# Patient Record
Sex: Female | Born: 1975
Health system: Southern US, Community
[De-identification: ages and names within clinical notes are randomized; demographics above are authoritative.]

## PROBLEM LIST (undated history)

## (undated) ENCOUNTER — Emergency Department (HOSPITAL_BASED_OUTPATIENT_CLINIC_OR_DEPARTMENT_OTHER)

## (undated) DIAGNOSIS — F32A Depression, unspecified: Secondary | ICD-10-CM

## (undated) DIAGNOSIS — F329 Major depressive disorder, single episode, unspecified: Secondary | ICD-10-CM

## (undated) DIAGNOSIS — R55 Syncope and collapse: Secondary | ICD-10-CM

## (undated) DIAGNOSIS — E039 Hypothyroidism, unspecified: Secondary | ICD-10-CM

## (undated) DIAGNOSIS — G43909 Migraine, unspecified, not intractable, without status migrainosus: Secondary | ICD-10-CM

## (undated) DIAGNOSIS — D649 Anemia, unspecified: Secondary | ICD-10-CM

## (undated) DIAGNOSIS — K5900 Constipation, unspecified: Secondary | ICD-10-CM

## (undated) DIAGNOSIS — R002 Palpitations: Secondary | ICD-10-CM

## (undated) DIAGNOSIS — R87629 Unspecified abnormal cytological findings in specimens from vagina: Secondary | ICD-10-CM

## (undated) DIAGNOSIS — F419 Anxiety disorder, unspecified: Secondary | ICD-10-CM

## (undated) DIAGNOSIS — E079 Disorder of thyroid, unspecified: Secondary | ICD-10-CM

## (undated) DIAGNOSIS — M7989 Other specified soft tissue disorders: Secondary | ICD-10-CM

## (undated) DIAGNOSIS — G4730 Sleep apnea, unspecified: Secondary | ICD-10-CM

## (undated) HISTORY — DX: Anxiety disorder, unspecified: F41.9

## (undated) HISTORY — DX: Unspecified abnormal cytological findings in specimens from vagina: R87.629

## (undated) HISTORY — PX: CERVICAL CONE BIOPSY: SUR198

## (undated) HISTORY — DX: Other specified soft tissue disorders: M79.89

## (undated) HISTORY — DX: Disorder of thyroid, unspecified: E07.9

## (undated) HISTORY — DX: Migraine, unspecified, not intractable, without status migrainosus: G43.909

## (undated) HISTORY — DX: Sleep apnea, unspecified: G47.30

## (undated) HISTORY — DX: Hypothyroidism, unspecified: E03.9

## (undated) HISTORY — DX: Constipation, unspecified: K59.00

## (undated) HISTORY — PX: KNEE SURGERY: SHX244

## (undated) HISTORY — DX: Anemia, unspecified: D64.9

## (undated) HISTORY — PX: MENISCUS REPAIR: SHX5179

## (undated) HISTORY — PX: ENDOMETRIAL ABLATION: SHX621

## (undated) HISTORY — DX: Palpitations: R00.2

---

## 2014-04-01 DIAGNOSIS — F332 Major depressive disorder, recurrent severe without psychotic features: Secondary | ICD-10-CM | POA: Insufficient documentation

## 2014-04-01 DIAGNOSIS — N390 Urinary tract infection, site not specified: Secondary | ICD-10-CM | POA: Insufficient documentation

## 2014-04-01 DIAGNOSIS — Q279 Congenital malformation of peripheral vascular system, unspecified: Secondary | ICD-10-CM | POA: Insufficient documentation

## 2014-04-01 DIAGNOSIS — G90A Postural orthostatic tachycardia syndrome (POTS): Secondary | ICD-10-CM | POA: Insufficient documentation

## 2014-04-01 DIAGNOSIS — I498 Other specified cardiac arrhythmias: Secondary | ICD-10-CM | POA: Insufficient documentation

## 2014-04-01 HISTORY — DX: Congenital malformation of peripheral vascular system, unspecified: Q27.9

## 2014-04-01 HISTORY — DX: Postural orthostatic tachycardia syndrome (POTS): G90.A

## 2014-04-01 HISTORY — DX: Urinary tract infection, site not specified: N39.0

## 2014-04-14 DIAGNOSIS — Z9889 Other specified postprocedural states: Secondary | ICD-10-CM | POA: Insufficient documentation

## 2014-04-14 HISTORY — DX: Other specified postprocedural states: Z98.890

## 2014-05-15 DIAGNOSIS — M6289 Other specified disorders of muscle: Secondary | ICD-10-CM | POA: Insufficient documentation

## 2014-05-15 DIAGNOSIS — E278 Other specified disorders of adrenal gland: Secondary | ICD-10-CM | POA: Insufficient documentation

## 2014-05-15 DIAGNOSIS — Z62810 Personal history of physical and sexual abuse in childhood: Secondary | ICD-10-CM | POA: Insufficient documentation

## 2014-05-15 HISTORY — DX: Other specified disorders of muscle: M62.89

## 2014-05-15 HISTORY — DX: Other specified disorders of adrenal gland: E27.8

## 2014-08-24 ENCOUNTER — Encounter (HOSPITAL_BASED_OUTPATIENT_CLINIC_OR_DEPARTMENT_OTHER): Payer: Self-pay | Admitting: Emergency Medicine

## 2014-08-24 ENCOUNTER — Emergency Department (HOSPITAL_BASED_OUTPATIENT_CLINIC_OR_DEPARTMENT_OTHER)
Admission: EM | Admit: 2014-08-24 | Discharge: 2014-08-24 | Disposition: A | Discharge status: Home / Self Care | Payer: BLUE CROSS/BLUE SHIELD | Attending: Emergency Medicine | Admitting: Emergency Medicine

## 2014-08-24 DIAGNOSIS — R110 Nausea: Secondary | ICD-10-CM | POA: Insufficient documentation

## 2014-08-24 DIAGNOSIS — R55 Syncope and collapse: Secondary | ICD-10-CM

## 2014-08-24 DIAGNOSIS — R61 Generalized hyperhidrosis: Secondary | ICD-10-CM | POA: Diagnosis not present

## 2014-08-24 DIAGNOSIS — R079 Chest pain, unspecified: Secondary | ICD-10-CM | POA: Diagnosis not present

## 2014-08-24 DIAGNOSIS — F329 Major depressive disorder, single episode, unspecified: Secondary | ICD-10-CM | POA: Insufficient documentation

## 2014-08-24 DIAGNOSIS — Z79899 Other long term (current) drug therapy: Secondary | ICD-10-CM | POA: Insufficient documentation

## 2014-08-24 DIAGNOSIS — R200 Anesthesia of skin: Secondary | ICD-10-CM | POA: Insufficient documentation

## 2014-08-24 DIAGNOSIS — D696 Thrombocytopenia, unspecified: Secondary | ICD-10-CM | POA: Insufficient documentation

## 2014-08-24 DIAGNOSIS — R42 Dizziness and giddiness: Secondary | ICD-10-CM | POA: Diagnosis present

## 2014-08-24 HISTORY — DX: Syncope and collapse: R55

## 2014-08-24 HISTORY — DX: Depression, unspecified: F32.A

## 2014-08-24 HISTORY — DX: Major depressive disorder, single episode, unspecified: F32.9

## 2014-08-24 LAB — BASIC METABOLIC PANEL
ANION GAP: 8 (ref 5–15)
BUN: 9 mg/dL (ref 6–20)
CALCIUM: 9 mg/dL (ref 8.9–10.3)
CHLORIDE: 104 mmol/L (ref 101–111)
CO2: 27 mmol/L (ref 22–32)
CREATININE: 0.83 mg/dL (ref 0.44–1.00)
GFR calc Af Amer: >60 mL/min (ref >60)
GFR calc non Af Amer: >60 mL/min (ref >60)
GLUCOSE: 102 mg/dL — AB (ref 65–99)
POTASSIUM: 3.9 mmol/L (ref 3.5–5.1)
Sodium: 139 mmol/L (ref 135–145)

## 2014-08-24 MED ORDER — SODIUM CHLORIDE 0.9 % IV BOLUS (SEPSIS)
1000.0000 mL | Freq: Once | INTRAVENOUS | Status: AC
  Administered 2014-08-24: 1000 mL via INTRAVENOUS

## 2014-08-24 NOTE — ED Provider Notes (Signed)
CSN: [id number]     Arrival date & time 08/24/14  1518 History   This chart was scribed for Tilden Fossa, MD by Abel Presto, ED Scribe. This patient was seen in room MH09/MH09 and the patient's care was started at 3:55 PM.    Chief Complaint  Patient presents with  . Dizziness  . Chest Pain  . Numbness     The history is provided by the patient. No language interpreter was used.   HPI Comments: Kathleen Phillips is a 39 y.o. female with who presents to the Emergency Department complaining of constant tingling and numbness in fingers feet, dizziness, and chest pain with onset around 10:30 AM. Pt with h/o neurocardiogenic syncope. Pt states this is typical of an episode but reports the episode is lasting longer than usual. She reports she typically has methods to relieve symptoms but states they have given her no relief. She notes associated nausea and diaphoresis at first onset which have resolved. Pt notes with standing up causes visual disturbances described as "black spots." She states she typically gets tunnel vision. Pt is not a smoker. Pt denies h/o DVT/PE. Pt denies dysuria, SOB, abdominal pain, fever, vomiting, diarrhea, and leg swelling different than baseline.   Past Medical History  Diagnosis Date  . Neurocardiogenic syncope   . Depression    Past Surgical History  Procedure Laterality Date  . Knee surgery    . Meniscus repair    . Endometrial ablation     No family history on file. History  Substance Use Topics  . Smoking status: Never Smoker   . Smokeless tobacco: Not on file  . Alcohol Use: Not on file   OB History    No data available     Review of Systems  Constitutional: Positive for diaphoresis. Negative for fever and chills.  Respiratory: Negative for shortness of breath.   Cardiovascular: Positive for chest pain.  Gastrointestinal: Positive for nausea. Negative for vomiting, abdominal pain and diarrhea.  Genitourinary: Negative for dysuria.   Neurological: Positive for dizziness and numbness. Negative for syncope.  All other systems reviewed and are negative.     Allergies  Ibuprofen  Home Medications   Prior to Admission medications   Medication Sig Start Date End Date Taking? Authorizing Provider  Vilazodone HCl (VIIBRYD) 40 MG TABS Take 40 mg by mouth daily.   Yes Historical Provider, MD   There were no vitals taken for this visit. Physical Exam  Constitutional: She is oriented to person, place, and time. She appears well-developed and well-nourished.  HENT:  Head: Normocephalic and atraumatic.  Cardiovascular: Normal rate and regular rhythm.   No murmur heard. Pulmonary/Chest: Effort normal and breath sounds normal. No respiratory distress.  Abdominal: Soft. There is no tenderness. There is no rebound and no guarding.  Musculoskeletal: She exhibits no edema or tenderness.  Neurological: She is alert and oriented to person, place, and time.  Skin: Skin is warm and dry.  Psychiatric: She has a normal mood and affect. Her behavior is normal.  Nursing note and vitals reviewed.   ED Course  Procedures (including critical care time) DIAGNOSTIC STUDIES: Oxygen Saturation is 100% on room air, normal by my interpretation.    COORDINATION OF CARE: 4:00 PM Discussed treatment plan with patient at beside, the patient agrees with the plan and has no further questions at this time.   Labs Review Labs Reviewed  BASIC METABOLIC PANEL - Abnormal; Notable for the following:    Glucose, Bld 102 (*)  All other components within normal limits    Imaging Review No results found.   EKG Interpretation   Date/Time:  Monday Aug 24 2014 15:32:51 EDT Ventricular Rate:  79 PR Interval:  154 QRS Duration: 86 QT Interval:  364 QTC Calculation: 417 R Axis:   85 Text Interpretation:  Normal sinus rhythm Normal ECG Confirmed by Lincoln Brighamees,  Liz [phone]([id number]) on 08/24/2014 3:48:19 PM      MDM   Final diagnoses:  Near syncope    Patient with history of neurocardiogenic syncope here for evaluation of dizziness, lightheadedness, chest tightness earlier. Patient presents with CBC and CBG from office prior to arrival. Hemoglobin within normal limits. Mild thrombocytopenia. His reports there is no chance of pregnancy, she is status post endometrial ablation and her husband has had a vasectomy. Clinical picture is not consistent with PE, perc negative. Clinical picture is not consistent with ACS, acute arrhythmia. Patient is partially improved on recheck after IV fluid bolus. Discussed cardiology follow-up.  I personally performed the services described in this documentation, which was scribed in my presence. The recorded information has been reviewed and is accurate.    Tilden FossaElizabeth Uchechukwu Dhawan, MD 08/24/14 [phone]

## 2014-08-24 NOTE — ED Notes (Signed)
Pt sitting at work when sudden onset dizziness with chest squeezing.  Some hands and feet numbness.  No sob.  Some nausea.  Some diaphoresis.  Now pt states chest just feels sore, some tingling in feet and calves.  Pt states she sometimes feels this way before her syncopal events.

## 2014-08-24 NOTE — ED Notes (Signed)
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  She reports in January  her Na was high, and her K+ was low. She forgot to tell the provider today.   She tolerated the iv start and NS 1000 started at 1630

## 2014-08-24 NOTE — Discharge Instructions (Signed)
Near-Syncope Near-syncope (commonly known as near fainting) is sudden weakness, dizziness, or feeling like you might pass out. During an episode of near-syncope, you may also develop pale skin, have tunnel vision, or feel sick to your stomach (nauseous). Near-syncope may occur when getting up after sitting or while standing for a long time. It is caused by a sudden decrease in blood flow to the brain. This decrease can result from various causes or triggers, most of which are not serious. However, because near-syncope can sometimes be a sign of something serious, a medical evaluation is required. The specific cause is often not determined. HOME CARE INSTRUCTIONS  Monitor your condition for any changes. The following actions may help to alleviate any discomfort you are experiencing:  Have someone stay with you until you feel stable.  Lie down right away and prop your feet up if you start feeling like you might faint. Breathe deeply and steadily. Wait until all the symptoms have passed. Most of these episodes last only a few minutes. You may feel tired for several hours.   Drink enough fluids to keep your urine clear or pale yellow.   If you are taking blood pressure or heart medicine, get up slowly when seated or lying down. Take several minutes to sit and then stand. This can reduce dizziness.  Follow up with your health care provider as directed. SEEK IMMEDIATE MEDICAL CARE IF:   You have a severe headache.   You have unusual pain in the chest, abdomen, or back.   You are bleeding from the mouth or rectum, or you have black or tarry stool.   You have an irregular or very fast heartbeat.   You have repeated fainting or have seizure-like jerking during an episode.   You faint when sitting or lying down.   You have confusion.   You have difficulty walking.   You have severe weakness.   You have vision problems.  MAKE SURE YOU:   Understand these instructions.  Will  watch your condition.  Will get help right away if you are not doing well or get worse. Document Released: 03/20/2005 Document Revised: 03/25/2013 Document Reviewed: 08/23/2012 ExitCare Patient Information 2015 [employer]. This information is not intended to replace advice given to you by your health care provider. Make sure you discuss any questions you have with your health care provider.  

## 2017-05-23 LAB — HM COLONOSCOPY

## 2017-11-22 LAB — HM PAP SMEAR

## 2018-02-26 DIAGNOSIS — F3181 Bipolar II disorder: Secondary | ICD-10-CM

## 2018-02-26 HISTORY — DX: Bipolar II disorder: F31.81

## 2020-01-02 ENCOUNTER — Other Ambulatory Visit: Payer: Self-pay

## 2020-01-02 ENCOUNTER — Ambulatory Visit (INDEPENDENT_AMBULATORY_CARE_PROVIDER_SITE_OTHER): Payer: PRIVATE HEALTH INSURANCE | Admitting: Physician Assistant

## 2020-01-02 ENCOUNTER — Encounter: Payer: Self-pay | Admitting: Physician Assistant

## 2020-01-02 VITALS — BP 111/64 | HR 77 | Ht 69.0 in | Wt 208.0 lb

## 2020-01-02 DIAGNOSIS — Z1231 Encounter for screening mammogram for malignant neoplasm of breast: Secondary | ICD-10-CM

## 2020-01-02 DIAGNOSIS — I498 Other specified cardiac arrhythmias: Secondary | ICD-10-CM

## 2020-01-02 DIAGNOSIS — F332 Major depressive disorder, recurrent severe without psychotic features: Secondary | ICD-10-CM

## 2020-01-02 DIAGNOSIS — E6609 Other obesity due to excess calories: Secondary | ICD-10-CM

## 2020-01-02 DIAGNOSIS — F3181 Bipolar II disorder: Secondary | ICD-10-CM

## 2020-01-02 DIAGNOSIS — G90A Postural orthostatic tachycardia syndrome (POTS): Secondary | ICD-10-CM

## 2020-01-02 DIAGNOSIS — E66812 Obesity, class 2: Secondary | ICD-10-CM | POA: Insufficient documentation

## 2020-01-02 DIAGNOSIS — R41840 Attention and concentration deficit: Secondary | ICD-10-CM | POA: Diagnosis not present

## 2020-01-02 DIAGNOSIS — Z6830 Body mass index (BMI) 30.0-30.9, adult: Secondary | ICD-10-CM

## 2020-01-02 DIAGNOSIS — G43009 Migraine without aura, not intractable, without status migrainosus: Secondary | ICD-10-CM

## 2020-01-02 HISTORY — DX: Other obesity due to excess calories: E66.09

## 2020-01-02 MED ORDER — PHENTERMINE HCL 37.5 MG PO TABS: ORAL_TABLET | ORAL | 0 refills | Status: DC

## 2020-01-02 MED FILL — PHENTERMINE 37.5 MG TABLET: 37.5 | 90 days supply | Qty: 45 | Fill #0

## 2020-01-02 NOTE — Progress Notes (Signed)
New Patient Office Visit  Subjective:  Patient ID: Kathleen Phillips, female    DOB: 04/08/1975  Age: 44 y.o. MRN: [id number]  CC:  Chief Complaint  Patient presents with  . Establish Care    HPI Kathleen Phillips presents to establish care.   Patient has new insurance and needed to have a new PCP.  She needs referral to psychiatry to manage her behavioral health medications.  She would also like to be considered testing for ADHD.  She feels like her psych meds are controlled but she is still having increasingly problems with focus and staying on task.  She also has schoolwork at night that she has to continue to stay focused for prolonged periods of time.  She is also interested in weight loss.  She is part of a weight loss group where she was on Qsymia.  She did really well on Qsymia.  She was then put on Topamax separately after stopping the weight loss program for migraines.  She wonders if she can go on the phentermine portion.   Past Medical History:  Diagnosis Date  . Anxiety   . Depression   . Neurocardiogenic syncope     Past Surgical History:  Procedure Laterality Date  . ENDOMETRIAL ABLATION    . KNEE SURGERY    . MENISCUS REPAIR      Family History  Problem Relation Age of Onset  . Hypertension Mother   . Heart attack Mother   . Diabetes Mother   . Skin cancer Mother   . Lung cancer Father   . Heart attack Maternal Grandmother     Social History   Socioeconomic History  . Marital status: Married    Spouse name: Not on file  . Number of children: Not on file  . Years of education: Not on file  . Highest education level: Not on file  Occupational History  . Not on file  Tobacco Use  . Smoking status: Never Smoker  . Smokeless tobacco: Never Used  Substance and Sexual Activity  . Alcohol use: Never  . Drug use: Never  . Sexual activity: Yes    Birth control/protection: Surgical    Comment: vasectomy  Other Topics Concern  . Not on file  Social  History Narrative  . Not on file   Social Determinants of Health   Financial Resource Strain:   . Difficulty of Paying Living Expenses: Not on file  Food Insecurity:   . Worried About Programme researcher, broadcasting/film/video in the Last Year: Not on file  . Ran Out of Food in the Last Year: Not on file  Transportation Needs:   . Lack of Transportation (Medical): Not on file  . Lack of Transportation (Non-Medical): Not on file  Physical Activity:   . Days of Exercise per Week: Not on file  . Minutes of Exercise per Session: Not on file  Stress:   . Feeling of Stress : Not on file  Social Connections:   . Frequency of Communication with Friends and Family: Not on file  . Frequency of Social Gatherings with Friends and Family: Not on file  . Attends Religious Services: Not on file  . Active Member of Clubs or Organizations: Not on file  . Attends Banker Meetings: Not on file  . Marital Status: Not on file  Intimate Partner Violence:   . Fear of Current or Ex-Partner: Not on file  . Emotionally Abused: Not on file  . Physically Abused: Not on  file  . Sexually Abused: Not on file    ROS Review of Systems  All other systems reviewed and are negative.   Objective:   Today's Vitals: BP 111/64   Pulse 77   Ht 5\' 9"  (1.753 m)   Wt 208 lb (94.3 kg)   SpO2 100%   BMI 30.72 kg/m   Physical Exam Vitals reviewed.  Constitutional:      Appearance: Normal appearance.  HENT:     Head: Normocephalic.  Cardiovascular:     Rate and Rhythm: Normal rate and regular rhythm.     Heart sounds: No murmur heard.   Pulmonary:     Effort: Pulmonary effort is normal.     Breath sounds: Normal breath sounds.  Neurological:     General: No focal deficit present.     Mental Status: She is alert and oriented to person, place, and time.  Psychiatric:        Mood and Affect: Mood normal.     Assessment & Plan:  Marland KitchenChristal was seen today for establish care.  Diagnoses and all orders for  this visit:  Bipolar 2 disorder, major depressive episode (HCC) -     Ambulatory referral to Psychiatry  Class 1 obesity due to excess calories without serious comorbidity with body mass index (BMI) of 30.0 to 30.9 in adult -     phentermine (ADIPEX-P) 37.5 MG tablet; Take 1/2 tablet daily.  Major depressive disorder, recurrent, severe without psychotic features (HCC) -     Ambulatory referral to Psychiatry  Inattention -     Ambulatory referral to Psychology  Visit for screening mammogram -     MM 3D SCREEN BREAST BILATERAL  Migraine without aura and without status migrainosus, not intractable  POTS (postural orthostatic tachycardia syndrome)   ON had made referrals for behavioral health to manage her bipolar.  I did go ahead and order ADHD testing for patient.  I do want to have her help to manage any amphetamine stimulant use.  Patient does need a mammogram.  That was ordered today.  [name].Discussed low carb diet with 1500 calories and 80g of protein.  Exercising at least 150 minutes a week.  My Fitness Pal could be a [name].  Discussed intermittent fasting and other options for weight control.  She was on Qsymia and did well.  Agreed to take 37.5 phentermine dose and told her to cut in half.  This is approximately with the highest dose of Qsymia would be with the Topamax she is already on.   Follow-up: Return in about 6 months (around 07/02/2020).   09/01/2020, PA-C

## 2020-01-05 DIAGNOSIS — G43009 Migraine without aura, not intractable, without status migrainosus: Secondary | ICD-10-CM | POA: Insufficient documentation

## 2020-01-05 HISTORY — DX: Migraine without aura, not intractable, without status migrainosus: G43.009

## 2020-01-08 ENCOUNTER — Encounter: Payer: Self-pay | Admitting: Physician Assistant

## 2020-01-12 ENCOUNTER — Encounter: Payer: Self-pay | Admitting: Physician Assistant

## 2020-01-21 ENCOUNTER — Ambulatory Visit: Payer: BLUE CROSS/BLUE SHIELD | Admitting: Nurse Practitioner

## 2020-02-04 ENCOUNTER — Encounter: Payer: Self-pay | Admitting: Physician Assistant

## 2020-02-06 ENCOUNTER — Other Ambulatory Visit: Payer: Self-pay | Admitting: Physician Assistant

## 2020-02-06 MED ORDER — AMPHETAMINE-DEXTROAMPHET ER 10 MG PO CP24: 10.0000 mg | ORAL_CAPSULE | Freq: Every day | ORAL | 0 refills | Status: DC

## 2020-02-06 MED ORDER — RIZATRIPTAN BENZOATE 10 MG PO TABS: 10.0000 mg | ORAL_TABLET | ORAL | 1 refills | Status: DC | PRN

## 2020-02-06 MED FILL — RIZATRIPTAN 10 MG TABLET: 10 | 30 days supply | Qty: 12 | Fill #0

## 2020-02-06 MED FILL — ADDERALL XR 10 MG CAP SA: 10 | 30 days supply | Qty: 30 | Fill #0

## 2020-03-03 ENCOUNTER — Other Ambulatory Visit: Payer: Self-pay

## 2020-03-03 ENCOUNTER — Other Ambulatory Visit: Payer: Self-pay | Admitting: Physician Assistant

## 2020-03-03 ENCOUNTER — Ambulatory Visit (INDEPENDENT_AMBULATORY_CARE_PROVIDER_SITE_OTHER): Payer: 59 | Admitting: Physician Assistant

## 2020-03-03 VITALS — BP 93/65 | HR 80 | Ht 69.0 in | Wt 197.5 lb

## 2020-03-03 DIAGNOSIS — F332 Major depressive disorder, recurrent severe without psychotic features: Secondary | ICD-10-CM | POA: Diagnosis not present

## 2020-03-03 DIAGNOSIS — R41840 Attention and concentration deficit: Secondary | ICD-10-CM | POA: Diagnosis not present

## 2020-03-03 DIAGNOSIS — F3181 Bipolar II disorder: Secondary | ICD-10-CM | POA: Diagnosis not present

## 2020-03-03 DIAGNOSIS — I498 Other specified cardiac arrhythmias: Secondary | ICD-10-CM

## 2020-03-03 DIAGNOSIS — G43009 Migraine without aura, not intractable, without status migrainosus: Secondary | ICD-10-CM

## 2020-03-03 DIAGNOSIS — G90A Postural orthostatic tachycardia syndrome (POTS): Secondary | ICD-10-CM

## 2020-03-03 MED ORDER — TOPIRAMATE 100 MG PO TABS: 100.0000 mg | ORAL_TABLET | Freq: Every day | ORAL | 3 refills | Status: DC

## 2020-03-03 MED ORDER — AMPHETAMINE-DEXTROAMPHET ER 10 MG PO CP24: 10.0000 mg | ORAL_CAPSULE | Freq: Every day | ORAL | 0 refills | Status: DC

## 2020-03-03 MED ORDER — RISPERIDONE 1 MG PO TABS: ORAL_TABLET | ORAL | 3 refills | Status: DC

## 2020-03-03 MED ORDER — CITALOPRAM HYDROBROMIDE 10 MG PO TABS: 10.0000 mg | ORAL_TABLET | Freq: Every day | ORAL | 3 refills | Status: DC

## 2020-03-03 MED ORDER — VILAZODONE HCL 40 MG PO TABS: 40.0000 mg | ORAL_TABLET | Freq: Every day | ORAL | 3 refills | Status: DC

## 2020-03-03 MED FILL — ADDERALL XR 10 MG CAP SA: 10 | 30 days supply | Qty: 30 | Fill #0

## 2020-03-03 MED FILL — CITALOPRAM HBR 10 MG TABLET: 10 | 90 days supply | Qty: 90 | Fill #0

## 2020-03-03 MED FILL — risperiDONE 1 MG TABS: 1 | 90 days supply | Qty: 135 | Fill #0

## 2020-03-03 MED FILL — TOPIRAMATE 100 MG TABLET: 100 | 90 days supply | Qty: 90 | Fill #0

## 2020-03-03 MED FILL — VIIBRYD 40 MG TABLET: 40 | 90 days supply | Qty: 90 | Fill #0
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
## 2020-03-05 ENCOUNTER — Encounter: Payer: Self-pay | Admitting: Physician Assistant

## 2020-03-11 ENCOUNTER — Encounter: Payer: Self-pay | Admitting: Physician Assistant

## 2020-03-15 MED ORDER — WEGOVY 0.25 MG/0.5ML ~~LOC~~ SOAJ: 0.2500 mg | SUBCUTANEOUS | 0 refills | Status: DC

## 2020-03-19 ENCOUNTER — Other Ambulatory Visit: Payer: Self-pay | Admitting: Physician Assistant

## 2020-03-19 ENCOUNTER — Encounter: Payer: Self-pay | Admitting: Physician Assistant

## 2020-03-19 MED ORDER — ONDANSETRON 8 MG PO TBDP: 8.0000 mg | ORAL_TABLET | Freq: Three times a day (TID) | ORAL | 1 refills | Status: DC | PRN

## 2020-03-19 MED FILL — ONDANSETRON ODT 8 MG TABLET: 8 | 7 days supply | Qty: 20 | Fill #0

## 2020-03-23 ENCOUNTER — Encounter: Payer: Self-pay | Admitting: Physician Assistant

## 2020-04-11 ENCOUNTER — Encounter: Payer: Self-pay | Admitting: Physician Assistant

## 2020-04-11 MED ORDER — WEGOVY 0.5 MG/0.5ML ~~LOC~~ SOAJ: 0.5000 mg | SUBCUTANEOUS | 0 refills | Status: DC

## 2020-04-11 NOTE — Telephone Encounter (Signed)
RX sent for next dose, FYI note.

## 2020-04-16 MED FILL — ADDERALL XR 10 MG CAP SA: 10 | 30 days supply | Qty: 30 | Fill #0

## 2020-04-22 ENCOUNTER — Telehealth: Payer: Self-pay

## 2020-04-22 NOTE — Telephone Encounter (Signed)
Prior authorization for Wegovy 0.5 mg/ml submitted to patient's insurance. Pending determination.

## 2020-04-23 NOTE — Telephone Encounter (Signed)
Prior authorization for Wegovy approved by the insurance from 04/04/20 through 10/19/20. The authorization is effective for a maximum of 6 fill(s), as long as the patient is enrolled as a member of their current health plan.   The request has been approved for 2 ml per 28 days. Additional prior authorizations:  Wegovy 0.25 mg (#41753) Wegovy 1 mg (11295) Wegovy 1.7 mg (11296) Wegovy 2.4 mg (01040)  Pt has been updated that rx has been approved.

## 2020-05-11 ENCOUNTER — Encounter: Payer: Self-pay | Admitting: Physician Assistant

## 2020-05-12 MED ORDER — WEGOVY 1 MG/0.5ML ~~LOC~~ SOAJ: 1.0000 mg | SUBCUTANEOUS | 0 refills | Status: DC

## 2020-05-26 ENCOUNTER — Ambulatory Visit (INDEPENDENT_AMBULATORY_CARE_PROVIDER_SITE_OTHER): Payer: 59 | Admitting: Physician Assistant

## 2020-05-26 ENCOUNTER — Encounter: Payer: Self-pay | Admitting: Physician Assistant

## 2020-05-26 ENCOUNTER — Other Ambulatory Visit: Payer: Self-pay | Admitting: Physician Assistant

## 2020-05-26 VITALS — BP 103/66 | HR 84 | Ht 69.0 in | Wt 184.0 lb

## 2020-05-26 DIAGNOSIS — R41840 Attention and concentration deficit: Secondary | ICD-10-CM

## 2020-05-26 DIAGNOSIS — E663 Overweight: Secondary | ICD-10-CM

## 2020-05-26 DIAGNOSIS — K5901 Slow transit constipation: Secondary | ICD-10-CM | POA: Diagnosis not present

## 2020-05-26 MED ORDER — AMPHETAMINE-DEXTROAMPHET ER 10 MG PO CP24: 10.0000 mg | ORAL_CAPSULE | Freq: Every day | ORAL | 0 refills | Status: DC

## 2020-05-26 MED ORDER — WEGOVY 1.7 MG/0.75ML ~~LOC~~ SOAJ: 1.7000 mg | SUBCUTANEOUS | 0 refills | Status: DC

## 2020-05-26 MED ORDER — LINACLOTIDE 145 MCG PO CAPS: 145.0000 ug | ORAL_CAPSULE | Freq: Every day | ORAL | 1 refills | Status: DC

## 2020-05-26 MED FILL — LINZESS 145 MCG CAPSULE: 145 | 90 days supply | Qty: 90 | Fill #0

## 2020-05-26 MED FILL — ADDERALL XR 10 MG CAP SA: 10 | 30 days supply | Qty: 30 | Fill #0

## 2020-05-26 NOTE — Patient Instructions (Signed)
Constipation, Adult Constipation is when a person has trouble pooping (having a bowel movement). When you have this condition, you may poop fewer than 3 times a week. Your poop (stool) may also be dry, hard, or bigger than normal. Follow these instructions at home: Eating and drinking  Eat foods that have a lot of fiber, such as: ? Fresh fruits and vegetables. ? Whole grains. ? Beans.  Eat less of foods that are low in fiber and high in fat and sugar, such as: ? French fries. ? Hamburgers. ? Cookies. ? Candy. ? Soda.  Drink enough fluid to keep your pee (urine) pale yellow.   General instructions  Exercise regularly or as told by your doctor. Try to do 150 minutes of exercise each week.  Go to the restroom when you feel like you need to poop. Do not hold it in.  Take over-the-counter and prescription medicines only as told by your doctor. These include any fiber supplements.  When you poop: ? Do deep breathing while relaxing your lower belly (abdomen). ? Relax your pelvic floor. The pelvic floor is a group of muscles that support the rectum, bladder, and intestines (as well as the uterus in women).  Watch your condition for any changes. Tell your doctor if you notice any.  Keep all follow-up visits as told by your doctor. This is important. Contact a doctor if:  You have pain that gets worse.  You have a fever.  You have not pooped for 4 days.  You vomit.  You are not hungry.  You lose weight.  You are bleeding from the opening of the butt (anus).  You have thin, pencil-like poop. Get help right away if:  You have a fever, and your symptoms suddenly get worse.  You leak poop or have blood in your poop.  Your belly feels hard or bigger than normal (bloated).  You have very bad belly pain.  You feel dizzy or you faint. Summary  Constipation is when a person poops fewer than 3 times a week, has trouble pooping, or has poop that is dry, hard, or bigger than  normal.  Eat foods that have a lot of fiber.  Drink enough fluid to keep your pee (urine) pale yellow.  Take over-the-counter and prescription medicines only as told by your doctor. These include any fiber supplements. This information is not intended to replace advice given to you by your health care provider. Make sure you discuss any questions you have with your health care provider. Document Revised: 02/05/2019 Document Reviewed: 02/05/2019 Elsevier Patient Education  2021 Else[employer] Inc.  
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
## 2020-05-28 DIAGNOSIS — K5901 Slow transit constipation: Secondary | ICD-10-CM

## 2020-05-28 DIAGNOSIS — E663 Overweight: Secondary | ICD-10-CM | POA: Insufficient documentation

## 2020-05-28 DIAGNOSIS — R41840 Attention and concentration deficit: Secondary | ICD-10-CM | POA: Insufficient documentation

## 2020-05-28 HISTORY — DX: Slow transit constipation: K59.01

## 2020-05-31 ENCOUNTER — Encounter: Payer: Self-pay | Admitting: Physician Assistant

## 2020-06-10 MED FILL — TOPIRAMATE 100 MG TABLET: 100 | 90 days supply | Qty: 90 | Fill #1

## 2020-06-10 MED FILL — risperiDONE 1 MG TABS: 1 | 90 days supply | Qty: 135 | Fill #1

## 2020-06-28 ENCOUNTER — Encounter: Payer: Self-pay | Admitting: Medical-Surgical

## 2020-06-28 ENCOUNTER — Ambulatory Visit (INDEPENDENT_AMBULATORY_CARE_PROVIDER_SITE_OTHER): Payer: 59 | Admitting: Medical-Surgical

## 2020-06-28 ENCOUNTER — Other Ambulatory Visit: Payer: Self-pay | Admitting: Medical-Surgical

## 2020-06-28 ENCOUNTER — Other Ambulatory Visit: Payer: Self-pay

## 2020-06-28 VITALS — BP 95/63 | HR 73 | Ht 69.0 in | Wt 181.0 lb

## 2020-06-28 DIAGNOSIS — J0100 Acute maxillary sinusitis, unspecified: Secondary | ICD-10-CM | POA: Diagnosis not present

## 2020-06-28 DIAGNOSIS — J302 Other seasonal allergic rhinitis: Secondary | ICD-10-CM

## 2020-06-28 MED ORDER — METHYLPREDNISOLONE ACETATE 80 MG/ML IJ SUSP
80.0000 mg | Freq: Once | INTRAMUSCULAR | Status: AC
  Administered 2020-06-28: 80 mg via INTRAMUSCULAR

## 2020-06-28 MED ORDER — LEVOCETIRIZINE DIHYDROCHLORIDE 5 MG PO TABS: 5.0000 mg | ORAL_TABLET | Freq: Every evening | ORAL | 1 refills | Status: DC

## 2020-06-28 MED ORDER — CEFDINIR 300 MG PO CAPS: 300.0000 mg | ORAL_CAPSULE | Freq: Two times a day (BID) | ORAL | 0 refills | Status: DC

## 2020-06-28 MED FILL — LEVOCETIRIZINE 5 MG TABLET: 5 | 90 days supply | Qty: 90 | Fill #0

## 2020-06-28 MED FILL — CEFDINIR 300 MG CAPSULE: 300 | 7 days supply | Qty: 14 | Fill #0

## 2020-06-28 NOTE — Progress Notes (Signed)
Subjective:    CC: Sinus symptoms  HPI: Pleasant 45 year old female presenting today for evaluation of 3 weeks of sinus symptoms.  She does have extensive environmental allergies and has been taking Allegra and Flonase daily.  Unfortunately these do not seem to be working for her and she has had worsening sinus congestion with left frontal maxillary sinus pain.  She is having daily headaches and has noted nasal discharge that started as yellow but has progressed to green.  Has fluid buildup in her left ear causing several distortion but no pain or drainage.  Denies fever, chills, cough, shortness of breath, and chest pain.  I reviewed the past medical history, family history, social history, surgical history, and allergies today and no changes were needed.  Please see the problem list section below in epic for further details.  Past Medical History: Past Medical History:  Diagnosis Date  . Anxiety   . Depression   . Neurocardiogenic syncope    Past Surgical History: Past Surgical History:  Procedure Laterality Date  . ENDOMETRIAL ABLATION    . KNEE SURGERY    . MENISCUS REPAIR     Social History: Social History   Socioeconomic History  . Marital status: Married    Spouse name: Not on file  . Number of children: Not on file  . Years of education: Not on file  . Highest education level: Not on file  Occupational History  . Not on file  Tobacco Use  . Smoking status: Never Smoker  . Smokeless tobacco: Never Used  Substance and Sexual Activity  . Alcohol use: Never  . Drug use: Never  . Sexual activity: Yes    Birth control/protection: Surgical    Comment: vasectomy  Other Topics Concern  . Not on file  Social History Narrative  . Not on file   Social Determinants of Health   Financial Resource Strain: Not on file  Food Insecurity: Not on file  Transportation Needs: Not on file  Physical Activity: Not on file  Stress: Not on file  Social Connections: Not on file    Family History: Family History  Problem Relation Age of Onset  . Hypertension Mother   . Heart attack Mother   . Diabetes Mother   . Skin cancer Mother   . Lung cancer Father   . Heart attack Maternal Grandmother    Allergies: Allergies  Allergen Reactions  . Bupropion Itching and Other (See Comments)    Other reaction(s): Other Hands itch Hands itch   . Avocado Nausea And Vomiting  . Ibuprofen Other (See Comments)    Palpation   . Other Other (See Comments)    Berries  . Tape     Red/sore skin   Medications: See med rec.  Review of Systems: See HPI for pertinent positives and negatives.   Objective:    General: Well Developed, well nourished, and in no acute distress.  Neuro: Alert and oriented x3.  HEENT: Normocephalic, atraumatic, pupils equal round reactive to light, neck supple, no masses, no lymphadenopathy, thyroid nonpalpable.  No sinus tenderness nose evaluation.  Right TM normal, left TM with serous effusion, no redness/bulging/drainage. Skin: Warm and dry. Cardiac: Regular rate and rhythm, no murmurs rubs or gallops, no lower extremity edema.  Respiratory: Clear to auscultation bilaterally. Not using accessory muscles, speaking in full sentences.   Impression and Recommendations:    1. Acute non-recurrent maxillary sinusitis 2. Seasonal allergies Severe seasonal allergies with 3 weeks of sinus congestion and facial  pain.  Treating with cefdinir milligrams twice daily x7 days and Depo-Medrol 80 mg IM x1 in office. - methylPREDNISolone acetate (DEPO-MEDROL) injection 80 mg  Return if symptoms worsen or fail to improve. ___________________________________________ Thayer Ohm, DNP, APRN, FNP-BC Primary Care and Sports Medicine [facility]

## 2020-06-28 NOTE — Patient Instructions (Signed)
Switch to Xyzal (stop Allegra)  Start saline nasal spray prn  Continue Flonase   Try a cool mist humidifier especially at night  Take Cefdinir 300mg  BID for 7 days

## 2020-07-02 ENCOUNTER — Encounter: Payer: Self-pay | Admitting: Physician Assistant

## 2020-07-02 DIAGNOSIS — B001 Herpesviral vesicular dermatitis: Secondary | ICD-10-CM

## 2020-07-02 HISTORY — DX: Herpesviral vesicular dermatitis: B00.1

## 2020-07-02 MED ORDER — VALACYCLOVIR HCL 500 MG PO TABS: ORAL_TABLET | ORAL | 1 refills | Status: AC

## 2020-07-08 DIAGNOSIS — J343 Hypertrophy of nasal turbinates: Secondary | ICD-10-CM | POA: Diagnosis not present

## 2020-07-08 DIAGNOSIS — G4733 Obstructive sleep apnea (adult) (pediatric): Secondary | ICD-10-CM | POA: Diagnosis not present

## 2020-07-12 ENCOUNTER — Other Ambulatory Visit (HOSPITAL_BASED_OUTPATIENT_CLINIC_OR_DEPARTMENT_OTHER): Payer: Self-pay

## 2020-07-12 MED FILL — Amphetamine-Dextroamphetamine Cap ER 24HR 10 MG: ORAL | 30 days supply | Qty: 30 | Fill #0 | Status: AC

## 2020-07-12 MED FILL — Citalopram Hydrobromide Tab 10 MG (Base Equiv): ORAL | 90 days supply | Qty: 90 | Fill #0 | Status: AC

## 2020-07-12 MED FILL — Vilazodone HCl Tab 40 MG: ORAL | 90 days supply | Qty: 90 | Fill #0 | Status: AC

## 2020-07-15 ENCOUNTER — Ambulatory Visit: Payer: 59

## 2020-07-22 ENCOUNTER — Ambulatory Visit (INDEPENDENT_AMBULATORY_CARE_PROVIDER_SITE_OTHER): Payer: 59

## 2020-07-22 ENCOUNTER — Other Ambulatory Visit: Payer: Self-pay

## 2020-07-22 DIAGNOSIS — Z1231 Encounter for screening mammogram for malignant neoplasm of breast: Secondary | ICD-10-CM | POA: Diagnosis not present

## 2020-07-28 ENCOUNTER — Encounter: Payer: 59 | Admitting: Physician Assistant

## 2020-07-29 ENCOUNTER — Ambulatory Visit: Payer: 59 | Admitting: Psychology

## 2020-08-04 ENCOUNTER — Other Ambulatory Visit (HOSPITAL_BASED_OUTPATIENT_CLINIC_OR_DEPARTMENT_OTHER): Payer: Self-pay

## 2020-08-04 ENCOUNTER — Other Ambulatory Visit: Payer: Self-pay

## 2020-08-04 ENCOUNTER — Ambulatory Visit (INDEPENDENT_AMBULATORY_CARE_PROVIDER_SITE_OTHER): Payer: 59 | Admitting: Physician Assistant

## 2020-08-04 VITALS — BP 103/51 | HR 84 | Ht 69.0 in | Wt 186.0 lb

## 2020-08-04 DIAGNOSIS — R635 Abnormal weight gain: Secondary | ICD-10-CM | POA: Diagnosis not present

## 2020-08-04 DIAGNOSIS — G43009 Migraine without aura, not intractable, without status migrainosus: Secondary | ICD-10-CM | POA: Diagnosis not present

## 2020-08-04 DIAGNOSIS — Z1329 Encounter for screening for other suspected endocrine disorder: Secondary | ICD-10-CM

## 2020-08-04 DIAGNOSIS — Z131 Encounter for screening for diabetes mellitus: Secondary | ICD-10-CM | POA: Diagnosis not present

## 2020-08-04 DIAGNOSIS — E663 Overweight: Secondary | ICD-10-CM

## 2020-08-04 DIAGNOSIS — Z0000 Encounter for general adult medical examination without abnormal findings: Secondary | ICD-10-CM

## 2020-08-04 DIAGNOSIS — Z79899 Other long term (current) drug therapy: Secondary | ICD-10-CM

## 2020-08-04 DIAGNOSIS — Z13220 Encounter for screening for lipoid disorders: Secondary | ICD-10-CM | POA: Diagnosis not present

## 2020-08-04 MED ORDER — WEGOVY 1 MG/0.5ML ~~LOC~~ SOAJ
1.0000 mg | SUBCUTANEOUS | 2 refills | Status: DC
  Filled 2020-08-04: qty 2, 28d supply, fill #0

## 2020-08-04 MED ORDER — TOPIRAMATE 100 MG PO TABS
ORAL_TABLET | ORAL | 1 refills | Status: DC
  Filled 2020-08-04 – 2020-08-25 (×3): qty 135, 90d supply, fill #0
  Filled 2020-12-09: qty 135, 90d supply, fill #1

## 2020-08-04 NOTE — Progress Notes (Signed)
Subjective:     Kathleen Phillips is a 45 y.o. female and is here for a comprehensive physical exam. The patient reports problems - headaches are worsening. almost every day now. been on topamax for years 100mg .  no known change.   Social History   Socioeconomic History  . Marital status: Married    Spouse name: Not on file  . Number of children: Not on file  . Years of education: Not on file  . Highest education level: Not on file  Occupational History  . Not on file  Tobacco Use  . Smoking status: Never Smoker  . Smokeless tobacco: Never Used  Substance and Sexual Activity  . Alcohol use: Never  . Drug use: Never  . Sexual activity: Yes    Birth control/protection: Surgical    Comment: vasectomy  Other Topics Concern  . Not on file  Social History Narrative  . Not on file   Social Determinants of Health   Financial Resource Strain: Not on file  Food Insecurity: Not on file  Transportation Needs: Not on file  Physical Activity: Not on file  Stress: Not on file  Social Connections: Not on file  Intimate Partner Violence: Not on file   Health Maintenance  Topic Date Due  . HIV Screening  Never done  . Hepatitis C Screening  Never done  . COVID-19 Vaccine (3 - Booster for Pfizer series) 08/20/2020 (Originally 05/24/2020)  . INFLUENZA VACCINE  11/01/2020  . PAP SMEAR-Modifier  11/22/2020  . MAMMOGRAM  07/22/2021  . TETANUS/TDAP  06/28/2024  . HPV VACCINES  Aged Out    The following portions of the patient's history were reviewed and updated as appropriate: allergies, current medications, past family history, past medical history, past social history, past surgical history and problem list.  Review of Systems A comprehensive review of systems was negative.   Objective:    BP (!) 103/51   Pulse 84   Ht 5\' 9"  (1.753 m)   Wt 186 lb (84.4 kg)   SpO2 98%   BMI 27.47 kg/m  General appearance: alert, cooperative and appears stated age Head: Normocephalic, without  obvious abnormality, atraumatic Eyes: conjunctivae/corneas clear. PERRL, EOM's intact. Fundi benign. Ears: normal TM's and external ear canals both ears Nose: Nares normal. Septum midline. Mucosa normal. No drainage or sinus tenderness. Throat: lips, mucosa, and tongue normal; teeth and gums normal Neck: no adenopathy, no carotid bruit, no JVD, supple, symmetrical, trachea midline and thyroid not enlarged, symmetric, no tenderness/mass/nodules Back: symmetric, no curvature. ROM normal. No CVA tenderness. Lungs: clear to auscultation bilaterally Heart: regular rate and rhythm, S1, S2 normal, no murmur, click, rub or gallop Abdomen: soft, non-tender; bowel sounds normal; no masses,  no organomegaly Extremities: extremities normal, atraumatic, no cyanosis or edema Pulses: 2+ and symmetric Skin: Skin color, texture, turgor normal. No rashes or lesions Lymph nodes: Cervical, supraclavicular, and axillary nodes normal. Neurologic: Alert and oriented X 3, normal strength and tone. Normal symmetric reflexes. Normal coordination and gait   .06/30/2024 Depression screen [facility] 2/9 05/26/2020 01/02/2020  Decreased Interest 1 1  Down, Depressed, Hopeless 1 2  PHQ - 2 Score 2 3  Altered sleeping 1 2  Tired, decreased energy 1 2  Change in appetite 1 1  Feeling bad or failure about yourself  1 3  Trouble concentrating 0 0  Moving slowly or fidgety/restless 0 0  Suicidal thoughts 0 0  PHQ-9 Score 6 11  Difficult doing work/chores Somewhat difficult Somewhat difficult   .05/28/2020  GAD 7 : Generalized Anxiety Score 05/26/2020 01/02/2020  Nervous, Anxious, on Edge 0 1  Control/stop worrying 0 1  Worry too much - different things 0 1  Trouble relaxing 0 2  Restless 0 1  Easily annoyed or irritable 0 1  Afraid - awful might happen 0 2  Total GAD 7 Score 0 9  Anxiety Difficulty Not difficult at all Somewhat difficult     Assessment:    Healthy female exam.      Plan:    Marland KitchenMarland KitchenChristal was seen today for annual  exam.  Diagnoses and all orders for this visit:  Physical exam -     Cancel: CBC with Differential/Platelet -     Cancel: COMPLETE METABOLIC PANEL WITH GFR -     Cancel: Lipid Panel w/reflex Direct LDL -     Cancel: TSH -     CBC with Differential/Platelet -     COMPLETE METABOLIC PANEL WITH GFR -     Lipid Panel w/reflex Direct LDL -     TSH  Lipid screening -     Cancel: Lipid Panel w/reflex Direct LDL -     Lipid Panel w/reflex Direct LDL  Diabetes mellitus screening -     Cancel: COMPLETE METABOLIC PANEL WITH GFR -     COMPLETE METABOLIC PANEL WITH GFR  Thyroid disorder screen -     Cancel: TSH -     TSH  Medication management -     Cancel: CBC with Differential/Platelet -     Cancel: COMPLETE METABOLIC PANEL WITH GFR -     Cancel: Lipid Panel w/reflex Direct LDL -     Cancel: TSH -     CBC with Differential/Platelet -     COMPLETE METABOLIC PANEL WITH GFR -     Lipid Panel w/reflex Direct LDL -     TSH  Migraine without aura and without status migrainosus, not intractable -     topiramate (TOPAMAX) 100 MG tablet; Take one tablet at night and 1/2 tablet in the morning.  Overweight (BMI 25.0-29.9) -     Semaglutide-Weight Management (WEGOVY) 1 MG/0.5ML SOAJ; Inject 1 mg into the skin once a week.  Abnormal weight gain -     Semaglutide-Weight Management (WEGOVY) 1 MG/0.5ML SOAJ; Inject 1 mg into the skin once a week.   .. Discussed 150 minutes of exercise a week.  Encouraged vitamin D 1000 units and Calcium 1300mg  or 4 servings of dairy a day.  PHQ/GAD stable. Managed by [facility].  Mammogram UTD. Pap due this year.  Fasting labs ordered.  Covid/flu UTD.   [facility].Discussed low carb diet with 1500 calories and 80g of protein.  Exercising at least 150 minutes a week.  My Fitness Pal could be a [name].  Restart wegovy and stay at 1mg  where she tolerated.  She gained 8lbs from stopping 2 weeks ago.    Increased topamax to 200mg  daily. Follow up in 3 months or  sooner if headaches do not improve.     See After Visit Summary for Counseling Recommendations

## 2020-08-04 NOTE — Patient Instructions (Signed)
Health Maintenance, Female Adopting a healthy lifestyle and getting preventive care are important in promoting health and wellness. Ask your health care provider about:  The right schedule for you to have regular tests and exams.  Things you can do on your own to prevent diseases and keep yourself healthy. What should I know about diet, weight, and exercise? Eat a healthy diet  Eat a diet that includes plenty of vegetables, fruits, low-fat dairy products, and lean protein.  Do not eat a lot of foods that are high in solid fats, added sugars, or sodium.   Maintain a healthy weight Body mass index (BMI) is used to identify weight problems. It estimates body fat based on height and weight. Your health care provider can help determine your BMI and help you achieve or maintain a healthy weight. Get regular exercise Get regular exercise. This is one of the most important things you can do for your health. Most adults should:  Exercise for at least 150 minutes each week. The exercise should increase your heart rate and make you sweat (moderate-intensity exercise).  Do strengthening exercises at least twice a week. This is in addition to the moderate-intensity exercise.  Spend less time sitting. Even light physical activity can be beneficial. Watch cholesterol and blood lipids Have your blood tested for lipids and cholesterol at 43 years of age, then have this test every 5 years. Have your cholesterol levels checked more often if:  Your lipid or cholesterol levels are high.  You are older than 43 years of age.  You are at high risk for heart disease. What should I know about cancer screening? Depending on your health history and family history, you may need to have cancer screening at various ages. This may include screening for:  Breast cancer.  Cervical cancer.  Colorectal cancer.  Skin cancer.  Lung cancer. What should I know about heart disease, diabetes, and high blood  pressure? Blood pressure and heart disease  High blood pressure causes heart disease and increases the risk of stroke. This is more likely to develop in people who have high blood pressure readings, are of African descent, or are overweight.  Have your blood pressure checked: ? Every 3-5 years if you are 18-39 years of age. ? Every year if you are 40 years old or older. Diabetes Have regular diabetes screenings. This checks your fasting blood sugar level. Have the screening done:  Once every three years after age 40 if you are at a normal weight and have a low risk for diabetes.  More often and at a younger age if you are overweight or have a high risk for diabetes. What should I know about preventing infection? Hepatitis B If you have a higher risk for hepatitis B, you should be screened for this virus. Talk with your health care provider to find out if you are at risk for hepatitis B infection. Hepatitis C Testing is recommended for:  Everyone born from 1945 through 1965.  Anyone with known risk factors for hepatitis C. Sexually transmitted infections (STIs)  Get screened for STIs, including gonorrhea and chlamydia, if: ? You are sexually active and are younger than 43 years of age. ? You are older than 43 years of age and your health care provider tells you that you are at risk for this type of infection. ? Your sexual activity has changed since you were last screened, and you are at increased risk for chlamydia or gonorrhea. Ask your health care provider   if you are at risk.  Ask your health care provider about whether you are at high risk for HIV. Your health care provider may recommend a prescription medicine to help prevent HIV infection. If you choose to take medicine to prevent HIV, you should first get tested for HIV. You should then be tested every 3 months for as long as you are taking the medicine. Pregnancy  If you are about to stop having your period (premenopausal) and  you may become pregnant, seek counseling before you get pregnant.  Take 400 to 800 micrograms (mcg) of folic acid every day if you become pregnant.  Ask for birth control (contraception) if you want to prevent pregnancy. Osteoporosis and menopause Osteoporosis is a disease in which the bones lose minerals and strength with aging. This can result in bone fractures. If you are 65 years old or older, or if you are at risk for osteoporosis and fractures, ask your health care provider if you should:  Be screened for bone loss.  Take a calcium or vitamin D supplement to lower your risk of fractures.  Be given hormone replacement therapy (HRT) to treat symptoms of menopause. Follow these instructions at home: Lifestyle  Do not use any products that contain nicotine or tobacco, such as cigarettes, e-cigarettes, and chewing tobacco. If you need help quitting, ask your health care provider.  Do not use street drugs.  Do not share needles.  Ask your health care provider for help if you need support or information about quitting drugs. Alcohol use  Do not drink alcohol if: ? Your health care provider tells you not to drink. ? You are pregnant, may be pregnant, or are planning to become pregnant.  If you drink alcohol: ? Limit how much you use to 0-1 drink a day. ? Limit intake if you are breastfeeding.  Be aware of how much alcohol is in your drink. In the [place] one drink equals one 12 oz bottle of beer (355 mL), one 5 oz glass of wine (148 mL), or one 1 oz glass of hard liquor (44 mL). General instructions  Schedule regular health, dental, and eye exams.  Stay current with your vaccines.  Tell your health care provider if: ? You often feel depressed. ? You have ever been abused or do not feel safe at home. Summary  Adopting a healthy lifestyle and getting preventive care are important in promoting health and wellness.  Follow your health care provider's instructions about healthy  diet, exercising, and getting tested or screened for diseases.  Follow your health care provider's instructions on monitoring your cholesterol and blood pressure. This information is not intended to replace advice given to you by your health care provider. Make sure you discuss any questions you have with your health care provider. Document Revised: 03/13/2018 Document Reviewed: 03/13/2018 Elsevier Patient Education  2021 Elsevier Inc.  

## 2020-08-05 LAB — CBC WITH DIFFERENTIAL/PLATELET
Absolute Monocytes: 359 cells/uL (ref 200–950)
Basophils Absolute: 21 cells/uL (ref 0–200)
Basophils Relative: 0.4 %
Eosinophils Absolute: 151 cells/uL (ref 15–500)
Eosinophils Relative: 2.9 %
HCT: 38.8 % (ref 35.0–45.0)
Hemoglobin: 12.3 g/dL (ref 11.7–15.5)
Lymphs Abs: 1378 cells/uL (ref 850–3900)
MCH: 31.8 pg (ref 27.0–33.0)
MCHC: 31.7 g/dL — ABNORMAL LOW (ref 32.0–36.0)
MCV: 100.3 fL — ABNORMAL HIGH (ref 80.0–100.0)
MPV: 10.9 fL (ref 7.5–12.5)
Monocytes Relative: 6.9 %
Neutro Abs: 3292 cells/uL (ref 1500–7800)
Neutrophils Relative %: 63.3 %
Platelets: 162 10*3/uL (ref 140–400)
RBC: 3.87 10*6/uL (ref 3.80–5.10)
RDW: 12.9 % (ref 11.0–15.0)
Total Lymphocyte: 26.5 %
WBC: 5.2 10*3/uL (ref 3.8–10.8)

## 2020-08-05 LAB — COMPLETE METABOLIC PANEL WITH GFR
AG Ratio: 1.9 (calc) (ref 1.0–2.5)
ALT: 8 U/L (ref 6–29)
AST: 17 U/L (ref 10–30)
Albumin: 4 g/dL (ref 3.6–5.1)
Alkaline phosphatase (APISO): 56 U/L (ref 31–125)
BUN: 12 mg/dL (ref 7–25)
CO2: 24 mmol/L (ref 20–32)
Calcium: 8.6 mg/dL (ref 8.6–10.2)
Chloride: 108 mmol/L (ref 98–110)
Creat: 0.81 mg/dL (ref 0.50–1.10)
GFR, Est African American: 104 mL/min/{1.73_m2} (ref >=60)
GFR, Est Non African American: 90 mL/min/{1.73_m2} (ref >=60)
Globulin: 2.1 g/dL (calc) (ref 1.9–3.7)
Glucose, Bld: 95 mg/dL (ref 65–99)
Potassium: 3.5 mmol/L (ref 3.5–5.3)
Sodium: 140 mmol/L (ref 135–146)
Total Bilirubin: 0.5 mg/dL (ref 0.2–1.2)
Total Protein: 6.1 g/dL (ref 6.1–8.1)

## 2020-08-05 LAB — TSH: TSH: 7.51 mIU/L — ABNORMAL HIGH

## 2020-08-05 LAB — LIPID PANEL W/REFLEX DIRECT LDL
Cholesterol: 141 mg/dL (ref <200)
HDL: 55 mg/dL (ref >=50)
LDL Cholesterol (Calc): 71 mg/dL (calc)
Non-HDL Cholesterol (Calc): 86 mg/dL (calc) (ref <130)
Total CHOL/HDL Ratio: 2.6 (calc) (ref <5.0)
Triglycerides: 72 mg/dL (ref <150)

## 2020-08-05 NOTE — Progress Notes (Signed)
Jesi,   Your thyroid is showing some hypo functioning. Starting some supplementation could make you feel better if having any of the hypo thyroid symptoms. Are you ok with starting levothyroxine? JJ, please add anti-TPO to check for antibodies.   Cholesterol looks fantastic.   The size of your RBC are big. I would like to add B12 and folate to look for deficiency.

## 2020-08-06 ENCOUNTER — Encounter: Payer: Self-pay | Admitting: Physician Assistant

## 2020-08-06 DIAGNOSIS — R635 Abnormal weight gain: Secondary | ICD-10-CM | POA: Insufficient documentation

## 2020-08-06 HISTORY — DX: Abnormal weight gain: R63.5

## 2020-08-08 ENCOUNTER — Encounter: Payer: Self-pay | Admitting: Physician Assistant

## 2020-08-09 ENCOUNTER — Other Ambulatory Visit (HOSPITAL_BASED_OUTPATIENT_CLINIC_OR_DEPARTMENT_OTHER): Payer: Self-pay

## 2020-08-09 DIAGNOSIS — G4733 Obstructive sleep apnea (adult) (pediatric): Secondary | ICD-10-CM

## 2020-08-09 MED ORDER — LEVOTHYROXINE SODIUM 25 MCG PO TABS
25.0000 ug | ORAL_TABLET | Freq: Every day | ORAL | 1 refills | Status: DC
  Filled 2020-08-09: qty 30, 30d supply, fill #0
  Filled 2020-09-06: qty 30, 30d supply, fill #1

## 2020-08-12 ENCOUNTER — Encounter: Payer: Self-pay | Admitting: Physician Assistant

## 2020-08-16 ENCOUNTER — Other Ambulatory Visit (HOSPITAL_BASED_OUTPATIENT_CLINIC_OR_DEPARTMENT_OTHER): Payer: Self-pay

## 2020-08-19 ENCOUNTER — Encounter: Payer: Self-pay | Admitting: Physician Assistant

## 2020-08-19 DIAGNOSIS — R41840 Attention and concentration deficit: Secondary | ICD-10-CM

## 2020-08-20 ENCOUNTER — Other Ambulatory Visit (HOSPITAL_BASED_OUTPATIENT_CLINIC_OR_DEPARTMENT_OTHER): Payer: Self-pay

## 2020-08-20 MED ORDER — AMPHETAMINE-DEXTROAMPHET ER 10 MG PO CP24: 10.0000 mg | ORAL_CAPSULE | Freq: Every day | ORAL | 0 refills | Status: DC

## 2020-08-20 MED ORDER — AMPHETAMINE-DEXTROAMPHET ER 10 MG PO CP24
10.0000 mg | ORAL_CAPSULE | Freq: Every day | ORAL | 0 refills | Status: DC
  Filled 2020-08-20: qty 30, 30d supply, fill #0

## 2020-08-25 ENCOUNTER — Other Ambulatory Visit (HOSPITAL_BASED_OUTPATIENT_CLINIC_OR_DEPARTMENT_OTHER): Payer: Self-pay

## 2020-09-02 ENCOUNTER — Ambulatory Visit: Payer: 59 | Admitting: Psychology

## 2020-09-06 ENCOUNTER — Other Ambulatory Visit (HOSPITAL_BASED_OUTPATIENT_CLINIC_OR_DEPARTMENT_OTHER): Payer: Self-pay

## 2020-09-06 MED FILL — Levocetirizine Dihydrochloride Tab 5 MG: ORAL | 90 days supply | Qty: 90 | Fill #0 | Status: CN

## 2020-09-06 MED FILL — Risperidone Tab 1 MG: ORAL | 90 days supply | Qty: 135 | Fill #0 | Status: AC

## 2020-09-07 ENCOUNTER — Other Ambulatory Visit (HOSPITAL_BASED_OUTPATIENT_CLINIC_OR_DEPARTMENT_OTHER): Payer: Self-pay

## 2020-09-08 ENCOUNTER — Other Ambulatory Visit (HOSPITAL_BASED_OUTPATIENT_CLINIC_OR_DEPARTMENT_OTHER): Payer: Self-pay

## 2020-09-09 ENCOUNTER — Other Ambulatory Visit (HOSPITAL_COMMUNITY)
Admission: RE | Admit: 2020-09-09 | Discharge: 2020-09-09 | Disposition: A | Discharge status: Home / Self Care | Payer: 59 | Source: Ambulatory Visit | Attending: Obstetrics and Gynecology | Admitting: Obstetrics and Gynecology

## 2020-09-09 ENCOUNTER — Encounter: Payer: Self-pay | Admitting: *Deleted

## 2020-09-09 ENCOUNTER — Encounter: Payer: Self-pay | Admitting: Obstetrics and Gynecology

## 2020-09-09 ENCOUNTER — Ambulatory Visit (INDEPENDENT_AMBULATORY_CARE_PROVIDER_SITE_OTHER): Payer: 59 | Admitting: Obstetrics and Gynecology

## 2020-09-09 ENCOUNTER — Other Ambulatory Visit: Payer: Self-pay

## 2020-09-09 VITALS — BP 98/64 | HR 80 | Resp 16 | Ht 69.0 in | Wt 185.0 lb

## 2020-09-09 DIAGNOSIS — Z01419 Encounter for gynecological examination (general) (routine) without abnormal findings: Secondary | ICD-10-CM

## 2020-09-09 DIAGNOSIS — E079 Disorder of thyroid, unspecified: Secondary | ICD-10-CM | POA: Insufficient documentation

## 2020-09-09 DIAGNOSIS — Z124 Encounter for screening for malignant neoplasm of cervix: Secondary | ICD-10-CM

## 2020-09-09 NOTE — Progress Notes (Signed)
GYNECOLOGY ANNUAL PREVENTATIVE CARE ENCOUNTER NOTE  Subjective:   Kathleen Phillips is a 45 y.o. [id number] female here for a annual gynecologic exam. Current complaints: none.    Denies abnormal vaginal bleeding, discharge, pelvic pain, problems with intercourse or other gynecologic concerns. Declines STI screen.   Gynecologic History No LMP recorded. Patient has had an ablation. Contraception: none Last Pap: 2019. Results: normal Last mammogram: 07/2020. Results: Birads 1  Obstetric History OB History  Gravida Para Term Preterm AB Living  2 1     1     SAB IAB Ectopic Multiple Live Births  1            # Outcome Date GA Lbr Len/2nd Weight Sex Delivery Anes PTL Lv  2 SAB           1 Para             Obstetric Comments  First pregnancy was twins but lost 1 twin @ 4 months    Past Medical History:  Diagnosis Date   Anxiety    Depression    Migraines    Neurocardiogenic syncope    Thyroid disease    Vaginal Pap smear, abnormal     Past Surgical History:  Procedure Laterality Date   CERVICAL CONE BIOPSY     ENDOMETRIAL ABLATION     KNEE SURGERY     MENISCUS REPAIR      Current Outpatient Medications on File Prior to Visit  Medication Sig Dispense Refill   [START ON 10/20/2020] amphetamine-dextroamphetamine (ADDERALL XR) 10 MG 24 hr capsule Take 1 capsule (10 mg total) by mouth daily. 30 capsule 0   Cholecalciferol (VITAMIN D) 50 MCG (2000 UT) tablet Take 2,000 Units by mouth daily.     citalopram (CELEXA) 10 MG tablet TAKE 1 TABLET (10 MG) BY MOUTH DAILY. 90 tablet 3   EPINEPHrine 0.3 mg/0.3 mL IJ SOAJ injection Inject into the muscle.     levocetirizine (XYZAL) 5 MG tablet TAKE 1 TABLET (5 MG TOTAL) BY MOUTH EVERY EVENING. 90 tablet 1   levothyroxine (SYNTHROID) 25 MCG tablet Take 1 tablet (25 mcg total) by mouth daily before breakfast. 30 tablet 1   linaclotide (LINZESS) 145 MCG CAPS capsule TAKE 1 CAPSULE (145 MCG TOTAL) BY MOUTH DAILY BEFORE BREAKFAST. 90 capsule  1   Melatonin 10 MG TABS      Multiple Vitamins-Minerals (MULTIVITAMIN WITH MINERALS) tablet Take 1 tablet by mouth daily.     ondansetron (ZOFRAN-ODT) 8 MG disintegrating tablet DISSOLVE 1 TABLET (8 MG TOTAL) BY MOUTH EVERY 8 (EIGHT) HOURS AS NEEDED FOR NAUSEA. 20 tablet 1   risperiDONE (RISPERDAL) 1 MG tablet TAKE 1 & 1/2 TABLETS BY MOUTH NIGHTLY. 135 tablet 3   rizatriptan (MAXALT) 10 MG tablet TAKE 1 TABLET (10 MG TOTAL) BY MOUTH AS NEEDED FOR MIGRAINE. MAY REPEAT IN 2 HOURS IF NEEDED 30 tablet 1   Semaglutide-Weight Management (WEGOVY) 1 MG/0.5ML SOAJ Inject 1 mg into the skin once a week. 2 mL 2   topiramate (TOPAMAX) 100 MG tablet Take one tablet at night and 1/2 tablet in the morning. 135 tablet 1   UNABLE TO FIND CBD oil     valACYclovir (VALTREX) 500 MG tablet Take one tablet every 12 hours for 3 days for outbreaks. 36 tablet 1   Vilazodone HCl (VIIBRYD) 40 MG TABS TAKE 1 TABLET (40 MG) BY MOUTH DAILY. 90 tablet 3   No current facility-administered medications on file prior to visit.  Allergies  Allergen Reactions   Bupropion Itching and Other (See Comments)    Other reaction(s): Other Hands itch Hands itch    Avocado Nausea And Vomiting   Ibuprofen Other (See Comments)    Palpation    Other Other (See Comments)    Berries   Tape     Red/sore skin    Social History   Socioeconomic History   Marital status: Married    Spouse name: Not on file   Number of children: Not on file   Years of education: Not on file   Highest education level: Not on file  Occupational History   Not on file  Tobacco Use   Smoking status: Never   Smokeless tobacco: Never  Substance and Sexual Activity   Alcohol use: Never   Drug use: Never   Sexual activity: Yes    Birth control/protection: Surgical    Comment: vasectomy  Other Topics Concern   Not on file  Social History Narrative   Not on file   Social Determinants of Health   Financial Resource Strain: Not on file  Food  Insecurity: Not on file  Transportation Needs: Not on file  Physical Activity: Not on file  Stress: Not on file  Social Connections: Not on file  Intimate Partner Violence: Not on file    Family History  Problem Relation Age of Onset   Hypertension Mother    Heart attack Mother    Diabetes Mother    Skin cancer Mother    Lung cancer Father    Heart attack Maternal Grandmother    The following portions of the patient's history were reviewed and updated as appropriate: allergies, current medications, past family history, past medical history, past social history, past surgical history and problem list.  Review of Systems Pertinent items are noted in HPI.   Objective:  BP 98/64   Pulse 80   Resp 16   Ht 5\' 9"  (1.753 m)   Wt 185 lb (83.9 kg)   BMI 27.32 kg/m  CONSTITUTIONAL: Well-developed, well-nourished female in no acute distress.  HENT:  Normocephalic, atraumatic, External right and left ear normal. Oropharynx is clear and moist EYES: Conjunctivae and EOM are normal. Pupils are equal, round, and reactive to light. No scleral icterus.  NECK: Normal range of motion, supple, no masses.  Normal thyroid.  SKIN: Skin is warm and dry. No rash noted. Not diaphoretic. No erythema. No pallor. NEUROLOGIC: Alert and oriented to person, place, and time. Normal reflexes, muscle tone coordination. No cranial nerve deficit noted. PSYCHIATRIC: Normal mood and affect. Normal behavior. Normal judgment and thought content. CARDIOVASCULAR: Normal heart rate noted RESPIRATORY: Effort normal, no problems with respiration noted. BREASTS: Symmetric in size. No masses, skin changes, nipple drainage, or lymphadenopathy. ABDOMEN: Soft, no distention noted.  No tenderness, rebound or guarding.  PELVIC: Normal appearing external genitalia; normal appearing vaginal mucosa and cervix.  No abnormal discharge noted.  Pap smear obtained.  Normal uterine size, no other palpable masses, no uterine or adnexal  tenderness. MUSCULOSKELETAL: Normal range of motion. No tenderness.  No cyanosis, clubbing, or edema.  2+ distal pulses.  Exam done with chaperone present.   Assessment and Plan:   1. Well woman exam Healthy female exam No periods 2/2 ablation  2. Cervical cancer screening Pap today   Will follow up results of pap smear and manage accordingly. Encouraged improvement in diet and exercise.  COVID vaccine UTD Declines STI screen. Mammogram UTD Referral for colonoscopy n/a -  UTD Routine preventative health maintenance measures emphasized. Please refer to After Visit Summary for other counseling recommendations.    Baldemar Lenis, MD, [facility] Attending Center for [employer] [facility])

## 2020-09-10 ENCOUNTER — Ambulatory Visit (HOSPITAL_BASED_OUTPATIENT_CLINIC_OR_DEPARTMENT_OTHER): Payer: 59 | Attending: Otolaryngology | Admitting: Internal Medicine

## 2020-09-10 ENCOUNTER — Other Ambulatory Visit (HOSPITAL_BASED_OUTPATIENT_CLINIC_OR_DEPARTMENT_OTHER): Payer: Self-pay

## 2020-09-10 DIAGNOSIS — G4733 Obstructive sleep apnea (adult) (pediatric): Secondary | ICD-10-CM | POA: Diagnosis not present

## 2020-09-10 LAB — CYTOLOGY - PAP
Adequacy: ABSENT
Comment: NEGATIVE
Diagnosis: NEGATIVE
High risk HPV: NEGATIVE

## 2020-09-10 MED FILL — Levocetirizine Dihydrochloride Tab 5 MG: ORAL | 90 days supply | Qty: 90 | Fill #0 | Status: CN

## 2020-09-16 ENCOUNTER — Ambulatory Visit: Payer: 59 | Admitting: Psychology

## 2020-09-16 ENCOUNTER — Encounter: Payer: Self-pay | Admitting: Physician Assistant

## 2020-09-16 DIAGNOSIS — R718 Other abnormality of red blood cells: Secondary | ICD-10-CM

## 2020-09-17 ENCOUNTER — Other Ambulatory Visit (HOSPITAL_BASED_OUTPATIENT_CLINIC_OR_DEPARTMENT_OTHER): Payer: Self-pay

## 2020-09-17 DIAGNOSIS — R718 Other abnormality of red blood cells: Secondary | ICD-10-CM | POA: Diagnosis not present

## 2020-09-17 LAB — B12 AND FOLATE PANEL
Folate: >24 ng/mL
Vitamin B-12: 404 pg/mL (ref 200–1100)

## 2020-09-18 DIAGNOSIS — G4733 Obstructive sleep apnea (adult) (pediatric): Secondary | ICD-10-CM | POA: Diagnosis not present

## 2020-09-18 NOTE — Procedures (Signed)
   Patient Name: Kathleen Phillips, Kathleen Phillips Study Date: 09/10/2020 Gender: Female D.O.B: Nov 29, 1975 Age (years): 44 Referring Provider: Christia Reading Height (inches): 69 Interpreting Physician: Jetty Duhamel MD, ABSM Weight (lbs): 185 RPSGT: Juniata Sink BMI: 27 MRN: [id number] Neck Size: 13.00  CLINICAL INFORMATION Sleep Study Type: HST Indication for sleep study: OSA Epworth Sleepiness Score: 8  SLEEP STUDY TECHNIQUE A multi-channel overnight portable sleep study was performed. The channels recorded were: nasal airflow, thoracic respiratory movement, and oxygen saturation with a pulse oximetry. Snoring was also monitored.  MEDICATIONS Patient self administered medications include: none reported.  SLEEP ARCHITECTURE Patient was studied for 440.7 minutes. The sleep efficiency was 100.0 % and the patient was supine for 0%. The arousal index was 0.0 per hour.  RESPIRATORY PARAMETERS The overall AHI was 4.6 per hour, with a central apnea index of 0 per hour. The oxygen nadir was 89% during sleep.  CARDIAC DATA Mean heart rate during sleep was 72.5 bpm.  IMPRESSIONS - No significant obstructive sleep apnea occurred during this study (AHI = 4.6/h). - Mild oxygen desaturation was noted during this study (Min O2 = 89%). Mean O2 saturation 95%. - No snoring was detected during this study.  DIAGNOSIS - Normal study  RECOMMENDATIONS - Be careful with alcohol, sedatives and other CNS depressants that may worsen sleep apnea and disrupt normal sleep architecture. - Sleep hygiene should be reviewed to assess factors that may improve sleep quality. - Weight management and regular exercise should be initiated or continued. - Patient may benefit from in-lab study if theseressults are not consistent with clinical impression.  [Electronically signed] 09/18/2020 10:50 AM  Jetty Duhamel MD, ABSM Diplomate, American Board of Sleep Medicine   NPI: [id number]                           Jetty Duhamel Diplomate, American Board of Sleep Medicine  ELECTRONICALLY SIGNED ON:  09/18/2020, 10:47 AM [facility] PH: [phone]   FX: [phone] ACCREDITED BY THE AMERICAN ACADEMY OF SLEEP MEDICINE

## 2020-09-20 NOTE — Progress Notes (Signed)
B12 is normal range. Folate is great. I would consider B12 supplement if not on one to get B12 a little higher if having any B12 deficiency symptoms.

## 2020-09-21 ENCOUNTER — Other Ambulatory Visit: Payer: Self-pay | Admitting: Neurology

## 2020-09-21 DIAGNOSIS — E079 Disorder of thyroid, unspecified: Secondary | ICD-10-CM

## 2020-09-21 DIAGNOSIS — Z1329 Encounter for screening for other suspected endocrine disorder: Secondary | ICD-10-CM

## 2020-09-21 DIAGNOSIS — Z79899 Other long term (current) drug therapy: Secondary | ICD-10-CM

## 2020-09-21 NOTE — Progress Notes (Signed)
Sh

## 2020-09-24 ENCOUNTER — Ambulatory Visit: Payer: 59 | Admitting: Psychology

## 2020-09-29 DIAGNOSIS — Z79899 Other long term (current) drug therapy: Secondary | ICD-10-CM | POA: Diagnosis not present

## 2020-09-29 DIAGNOSIS — E079 Disorder of thyroid, unspecified: Secondary | ICD-10-CM | POA: Diagnosis not present

## 2020-09-30 ENCOUNTER — Ambulatory Visit: Payer: 59 | Admitting: Psychology

## 2020-09-30 ENCOUNTER — Ambulatory Visit (INDEPENDENT_AMBULATORY_CARE_PROVIDER_SITE_OTHER): Payer: 59 | Admitting: Psychology

## 2020-09-30 DIAGNOSIS — F3181 Bipolar II disorder: Secondary | ICD-10-CM | POA: Diagnosis not present

## 2020-09-30 LAB — TSH: TSH: 3.96 mIU/L

## 2020-09-30 NOTE — Progress Notes (Signed)
Patient reviewed results and recommendations on mychart.

## 2020-09-30 NOTE — Progress Notes (Signed)
Much better. In normal range. Optimal TSH range is 1-2. If you are still having any of the hypothyroid symptoms we could increase to try to get to that range. Thoughts?

## 2020-10-06 ENCOUNTER — Other Ambulatory Visit (HOSPITAL_BASED_OUTPATIENT_CLINIC_OR_DEPARTMENT_OTHER): Payer: Self-pay

## 2020-10-06 MED FILL — Levocetirizine Dihydrochloride Tab 5 MG: ORAL | 90 days supply | Qty: 90 | Fill #0 | Status: AC

## 2020-10-12 ENCOUNTER — Other Ambulatory Visit (HOSPITAL_BASED_OUTPATIENT_CLINIC_OR_DEPARTMENT_OTHER): Payer: Self-pay

## 2020-10-12 ENCOUNTER — Encounter: Payer: Self-pay | Admitting: Physician Assistant

## 2020-10-12 ENCOUNTER — Ambulatory Visit (INDEPENDENT_AMBULATORY_CARE_PROVIDER_SITE_OTHER): Payer: 59 | Admitting: Physician Assistant

## 2020-10-12 VITALS — BP 126/68 | HR 83 | Ht 69.0 in | Wt 192.0 lb

## 2020-10-12 DIAGNOSIS — R635 Abnormal weight gain: Secondary | ICD-10-CM | POA: Diagnosis not present

## 2020-10-12 DIAGNOSIS — E079 Disorder of thyroid, unspecified: Secondary | ICD-10-CM | POA: Diagnosis not present

## 2020-10-12 DIAGNOSIS — E663 Overweight: Secondary | ICD-10-CM | POA: Diagnosis not present

## 2020-10-12 DIAGNOSIS — F3181 Bipolar II disorder: Secondary | ICD-10-CM

## 2020-10-12 MED ORDER — WEGOVY 1 MG/0.5ML ~~LOC~~ SOAJ
1.0000 mg | SUBCUTANEOUS | 5 refills | Status: DC
  Filled 2020-10-12: qty 2, 28d supply, fill #0

## 2020-10-12 MED ORDER — LEVOTHYROXINE SODIUM 50 MCG PO TABS
50.0000 ug | ORAL_TABLET | Freq: Every day | ORAL | 0 refills | Status: DC
  Filled 2020-10-12: qty 90, 90d supply, fill #0

## 2020-10-12 MED ORDER — PLENITY PO CAPS: 3.0000 | ORAL_CAPSULE | Freq: Two times a day (BID) | ORAL | 5 refills | Status: DC
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
## 2020-10-13 ENCOUNTER — Encounter: Payer: Self-pay | Admitting: Physician Assistant

## 2020-10-19 ENCOUNTER — Other Ambulatory Visit (HOSPITAL_BASED_OUTPATIENT_CLINIC_OR_DEPARTMENT_OTHER): Payer: Self-pay

## 2020-10-19 DIAGNOSIS — F3181 Bipolar II disorder: Secondary | ICD-10-CM | POA: Insufficient documentation

## 2020-10-19 MED ORDER — CARIPRAZINE HCL 1.5 MG PO CAPS
1.5000 mg | ORAL_CAPSULE | Freq: Every day | ORAL | 1 refills | Status: DC
  Filled 2020-10-19: qty 30, 30d supply, fill #0
  Filled 2020-11-24: qty 30, 30d supply, fill #1

## 2020-10-20 ENCOUNTER — Other Ambulatory Visit (HOSPITAL_BASED_OUTPATIENT_CLINIC_OR_DEPARTMENT_OTHER): Payer: Self-pay

## 2020-10-25 ENCOUNTER — Other Ambulatory Visit (HOSPITAL_BASED_OUTPATIENT_CLINIC_OR_DEPARTMENT_OTHER): Payer: Self-pay

## 2020-11-01 ENCOUNTER — Ambulatory Visit (INDEPENDENT_AMBULATORY_CARE_PROVIDER_SITE_OTHER): Payer: 59

## 2020-11-01 ENCOUNTER — Other Ambulatory Visit: Payer: Self-pay

## 2020-11-01 ENCOUNTER — Telehealth: Payer: Self-pay | Admitting: Sports Medicine

## 2020-11-01 DIAGNOSIS — M25571 Pain in right ankle and joints of right foot: Secondary | ICD-10-CM

## 2020-11-01 HISTORY — DX: Pain in right ankle and joints of right foot: M25.571

## 2020-11-01 MED FILL — Vilazodone HCl Tab 40 MG: ORAL | 90 days supply | Qty: 90 | Fill #1 | Status: CN

## 2020-11-01 MED FILL — Citalopram Hydrobromide Tab 10 MG (Base Equiv): ORAL | 90 days supply | Qty: 90 | Fill #1 | Status: CN

## 2020-11-01 NOTE — Assessment & Plan Note (Signed)
Pleasant 45 year old female, couple weeks of pain at the sinus tarsi and lateral tibiotalar joint. I suspect sinus tarsi syndrome, getting some x-rays of her right ankle, my recommendation would be NSAIDs and scaphoid pads.

## 2020-11-01 NOTE — Telephone Encounter (Signed)
Right ankle pain Pleasant 45 year old female, couple weeks of pain at the sinus tarsi and lateral tibiotalar joint. I suspect sinus tarsi syndrome, getting some x-rays of her right ankle, my recommendation would be NSAIDs and scaphoid pads.

## 2020-11-02 ENCOUNTER — Other Ambulatory Visit (HOSPITAL_BASED_OUTPATIENT_CLINIC_OR_DEPARTMENT_OTHER): Payer: Self-pay

## 2020-11-03 ENCOUNTER — Other Ambulatory Visit (HOSPITAL_BASED_OUTPATIENT_CLINIC_OR_DEPARTMENT_OTHER): Payer: Self-pay

## 2020-11-11 ENCOUNTER — Other Ambulatory Visit (HOSPITAL_BASED_OUTPATIENT_CLINIC_OR_DEPARTMENT_OTHER): Payer: Self-pay

## 2020-11-11 MED FILL — Citalopram Hydrobromide Tab 10 MG (Base Equiv): ORAL | 90 days supply | Qty: 90 | Fill #1 | Status: AC

## 2020-11-11 MED FILL — Vilazodone HCl Tab 40 MG: ORAL | 90 days supply | Qty: 90 | Fill #1 | Status: AC

## 2020-11-25 ENCOUNTER — Other Ambulatory Visit (HOSPITAL_BASED_OUTPATIENT_CLINIC_OR_DEPARTMENT_OTHER): Payer: Self-pay

## 2020-12-10 ENCOUNTER — Other Ambulatory Visit (HOSPITAL_BASED_OUTPATIENT_CLINIC_OR_DEPARTMENT_OTHER): Payer: Self-pay

## 2020-12-15 ENCOUNTER — Ambulatory Visit (INDEPENDENT_AMBULATORY_CARE_PROVIDER_SITE_OTHER): Payer: 59 | Admitting: Physician Assistant

## 2020-12-15 ENCOUNTER — Encounter: Payer: Self-pay | Admitting: Physician Assistant

## 2020-12-15 ENCOUNTER — Other Ambulatory Visit: Payer: Self-pay

## 2020-12-15 ENCOUNTER — Other Ambulatory Visit (HOSPITAL_BASED_OUTPATIENT_CLINIC_OR_DEPARTMENT_OTHER): Payer: Self-pay

## 2020-12-15 VITALS — BP 94/63 | HR 68 | Ht 69.0 in | Wt 182.7 lb

## 2020-12-15 DIAGNOSIS — F5102 Adjustment insomnia: Secondary | ICD-10-CM

## 2020-12-15 DIAGNOSIS — Z636 Dependent relative needing care at home: Secondary | ICD-10-CM

## 2020-12-15 DIAGNOSIS — F332 Major depressive disorder, recurrent severe without psychotic features: Secondary | ICD-10-CM

## 2020-12-15 DIAGNOSIS — F3181 Bipolar II disorder: Secondary | ICD-10-CM | POA: Diagnosis not present

## 2020-12-15 HISTORY — DX: Dependent relative needing care at home: Z63.6

## 2020-12-15 HISTORY — DX: Adjustment insomnia: F51.02

## 2020-12-15 MED ORDER — EPINEPHRINE 0.3 MG/0.3ML IJ SOAJ
0.3000 mg | INTRAMUSCULAR | 11 refills | Status: DC | PRN
  Filled 2020-12-15: qty 2, 2d supply, fill #0

## 2020-12-15 MED ORDER — BELSOMRA 10 MG PO TABS
1.0000 | ORAL_TABLET | Freq: Every day | ORAL | 1 refills | Status: DC
  Filled 2020-12-15: qty 30, 30d supply, fill #0
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
## 2020-12-17 ENCOUNTER — Other Ambulatory Visit (HOSPITAL_BASED_OUTPATIENT_CLINIC_OR_DEPARTMENT_OTHER): Payer: Self-pay

## 2020-12-22 ENCOUNTER — Other Ambulatory Visit (HOSPITAL_BASED_OUTPATIENT_CLINIC_OR_DEPARTMENT_OTHER): Payer: Self-pay

## 2020-12-22 ENCOUNTER — Encounter: Payer: Self-pay | Admitting: Physician Assistant

## 2020-12-22 ENCOUNTER — Other Ambulatory Visit: Payer: Self-pay | Admitting: Physician Assistant

## 2020-12-22 DIAGNOSIS — F3181 Bipolar II disorder: Secondary | ICD-10-CM

## 2020-12-22 MED ORDER — CARIPRAZINE HCL 1.5 MG PO CAPS
1.5000 mg | ORAL_CAPSULE | Freq: Every day | ORAL | 1 refills | Status: DC
  Filled 2020-12-22: qty 90, 90d supply, fill #0
  Filled 2021-03-22: qty 90, 90d supply, fill #1

## 2020-12-22 MED FILL — Ondansetron Orally Disintegrating Tab 8 MG: ORAL | 6 days supply | Qty: 20 | Fill #0 | Status: AC

## 2020-12-22 NOTE — Telephone Encounter (Signed)
Should continue this dosage?

## 2020-12-23 ENCOUNTER — Other Ambulatory Visit (HOSPITAL_BASED_OUTPATIENT_CLINIC_OR_DEPARTMENT_OTHER): Payer: Self-pay

## 2020-12-24 ENCOUNTER — Other Ambulatory Visit (HOSPITAL_BASED_OUTPATIENT_CLINIC_OR_DEPARTMENT_OTHER): Payer: Self-pay

## 2020-12-24 ENCOUNTER — Telehealth: Payer: Self-pay

## 2020-12-24 MED ORDER — SAXENDA 18 MG/3ML ~~LOC~~ SOPN
3.0000 mg | PEN_INJECTOR | Freq: Every day | SUBCUTANEOUS | 0 refills | Status: DC
  Filled 2020-12-24: qty 15, 30d supply, fill #0

## 2020-12-24 MED FILL — Rizatriptan Benzoate Tab 10 MG (Base Equivalent): ORAL | 30 days supply | Qty: 18 | Fill #0 | Status: AC

## 2020-12-24 NOTE — Telephone Encounter (Signed)
Medication: Liraglutide -Weight Management (SAXENDA) 18 MG/3ML SOPN  Prior authorization submitted via CoverMyMeds on 12/24/2020 [place] submission pending  

## 2020-12-27 ENCOUNTER — Other Ambulatory Visit (HOSPITAL_BASED_OUTPATIENT_CLINIC_OR_DEPARTMENT_OTHER): Payer: Self-pay

## 2020-12-27 MED ORDER — INSULIN PEN NEEDLE 32G X 6 MM MISC
0 refills | Status: DC
  Filled 2020-12-27: qty 100, 90d supply, fill #0

## 2020-12-28 NOTE — Telephone Encounter (Signed)
Medication: Liraglutide -Weight Management (SAXENDA) 18 MG/3ML SOPN Prior authorization determination received Medication has been approved Approval dates: 12/24/2020-04/24/2021  Patient aware via: MyChart Pharmacy aware: Yes Provider aware via this encounter

## 2021-01-10 ENCOUNTER — Encounter: Payer: Self-pay | Admitting: Physician Assistant

## 2021-01-12 ENCOUNTER — Other Ambulatory Visit (HOSPITAL_BASED_OUTPATIENT_CLINIC_OR_DEPARTMENT_OTHER): Payer: Self-pay

## 2021-01-12 MED ORDER — BELSOMRA 20 MG PO TABS
20.0000 mg | ORAL_TABLET | Freq: Every day | ORAL | 1 refills | Status: DC
  Filled 2021-01-12: qty 30, 30d supply, fill #0

## 2021-01-20 ENCOUNTER — Ambulatory Visit: Payer: 59 | Admitting: Sports Medicine

## 2021-01-20 DIAGNOSIS — G5602 Carpal tunnel syndrome, left upper limb: Secondary | ICD-10-CM

## 2021-01-20 HISTORY — DX: Carpal tunnel syndrome, left upper limb: G56.02

## 2021-01-20 NOTE — Progress Notes (Signed)
    Procedures performed today:    None.  Independent interpretation of notes and tests performed by another provider:   None.  Brief History, Exam, Impression, and Recommendations:    Carpal tunnel syndrome, left This is a pleasant 45 year old female, for a month or so she has had increasing numbness tingling in the left hand, mostly in a median nerve distribution, worse in the morning. On exam positive Phalen and Tinel signs. She does have some burning sensation in the left trapezius.  I do think there is likely a cervical process but the dominant pain generator is carpal tunnel syndrome. Nighttime splinting for 6 weeks, Aleve twice a day, rehab exercises given. Return to see me in 6 weeks, median nerve hydrodissection if not better.    ___________________________________________ Ihor Austin. Benjamin Stain, M.D., ABFM., CAQSM. Primary Care and Sports Medicine  [facility]  Adjunct Instructor of Family Medicine  [employer]

## 2021-01-20 NOTE — Assessment & Plan Note (Signed)
This is a pleasant 45 year old female, for a month or so she has had increasing numbness tingling in the left hand, mostly in a median nerve distribution, worse in the morning. On exam positive Phalen and Tinel signs. She does have some burning sensation in the left trapezius.  I do think there is likely a cervical process but the dominant pain generator is carpal tunnel syndrome. Nighttime splinting for 6 weeks, Aleve twice a day, rehab exercises given. Return to see me in 6 weeks, median nerve hydrodissection if not better.

## 2021-02-02 ENCOUNTER — Telehealth: Payer: Self-pay | Admitting: Neurology

## 2021-02-02 ENCOUNTER — Other Ambulatory Visit: Payer: Self-pay | Admitting: Physician Assistant

## 2021-02-02 ENCOUNTER — Other Ambulatory Visit (HOSPITAL_BASED_OUTPATIENT_CLINIC_OR_DEPARTMENT_OTHER): Payer: Self-pay

## 2021-02-02 DIAGNOSIS — E079 Disorder of thyroid, unspecified: Secondary | ICD-10-CM

## 2021-02-02 LAB — TSH: TSH: 0.99 mIU/L

## 2021-02-02 MED ORDER — LEVOTHYROXINE SODIUM 50 MCG PO TABS
50.0000 ug | ORAL_TABLET | Freq: Every day | ORAL | 3 refills | Status: DC
Start: 2021-02-02 — End: 2022-02-07
  Filled 2021-02-02: qty 90, 90d supply, fill #0
  Filled 2021-06-20: qty 90, 90d supply, fill #1
  Filled 2021-10-10: qty 90, 90d supply, fill #2
  Filled 2022-01-24: qty 90, 90d supply, fill #3

## 2021-02-02 NOTE — Progress Notes (Signed)
TSH looks great. Sent refills.

## 2021-02-02 NOTE — Telephone Encounter (Signed)
Needs TSH checked after change in med dosage.

## 2021-02-03 ENCOUNTER — Ambulatory Visit (INDEPENDENT_AMBULATORY_CARE_PROVIDER_SITE_OTHER): Payer: 59 | Admitting: Sports Medicine

## 2021-02-03 ENCOUNTER — Other Ambulatory Visit: Payer: Self-pay

## 2021-02-03 ENCOUNTER — Encounter: Payer: Self-pay | Admitting: Sports Medicine

## 2021-02-03 ENCOUNTER — Ambulatory Visit (INDEPENDENT_AMBULATORY_CARE_PROVIDER_SITE_OTHER): Payer: 59

## 2021-02-03 ENCOUNTER — Other Ambulatory Visit (HOSPITAL_BASED_OUTPATIENT_CLINIC_OR_DEPARTMENT_OTHER): Payer: Self-pay

## 2021-02-03 VITALS — BP 102/70 | HR 67

## 2021-02-03 DIAGNOSIS — M533 Sacrococcygeal disorders, not elsewhere classified: Secondary | ICD-10-CM | POA: Diagnosis not present

## 2021-02-03 DIAGNOSIS — M545 Low back pain, unspecified: Secondary | ICD-10-CM

## 2021-02-03 HISTORY — DX: Low back pain, unspecified: M54.50

## 2021-02-03 MED ORDER — PREDNISONE 50 MG PO TABS
50.0000 mg | ORAL_TABLET | Freq: Every day | ORAL | 0 refills | Status: DC
  Filled 2021-02-03: qty 5, 5d supply, fill #0

## 2021-02-03 MED ORDER — KETOROLAC TROMETHAMINE 60 MG/2ML IM SOLN
30.0000 mg | Freq: Once | INTRAMUSCULAR | Status: AC
  Administered 2021-02-03: 30 mg via INTRAMUSCULAR

## 2021-02-03 NOTE — Addendum Note (Signed)
Addended by: Gaynelle Arabian on: 02/03/2021 01:30 PM   Modules accepted: Orders

## 2021-02-03 NOTE — Progress Notes (Signed)
    Procedures performed today:    None.  Independent interpretation of notes and tests performed by another provider:   None.  Brief History, Exam, Impression, and Recommendations:    Acute left-sided low back pain without sciatica This is a pleasant 45 year old female, she has a history of low back pain, left-sided, diagnosis SI joint dysfunction at an outside facility.  More recently she has been lifting her husband's wheelchair, and is now having increasing right-sided low back pain localized at the sacroiliac joint with radiation into the right buttock and lateral hip. Mild tenderness at the SI joint, pain is worse with standing. No red flag symptoms. Adding lumbar, SI joint x-rays, conditioning exercises, 5 days of prednisone and Toradol 30. Avoid lifting more than 5 pounds. Return to see me in 4 weeks, SI joint injection if not better.    ___________________________________________ Ihor Austin. Benjamin Stain, M.D., ABFM., CAQSM. Primary Care and Sports Medicine [facility]  Adjunct Instructor of Family Medicine  [employer]

## 2021-02-03 NOTE — Assessment & Plan Note (Signed)
This is a pleasant 45 year old female, she has a history of low back pain, left-sided, diagnosis SI joint dysfunction at an outside facility.  More recently she has been lifting her husband's wheelchair, and is now having increasing right-sided low back pain localized at the sacroiliac joint with radiation into the right buttock and lateral hip. Mild tenderness at the SI joint, pain is worse with standing. No red flag symptoms. Adding lumbar, SI joint x-rays, conditioning exercises, 5 days of prednisone and Toradol 30. Avoid lifting more than 5 pounds. Return to see me in 4 weeks, SI joint injection if not better.

## 2021-02-04 ENCOUNTER — Ambulatory Visit: Payer: 59 | Admitting: Sports Medicine

## 2021-02-08 ENCOUNTER — Other Ambulatory Visit: Payer: Self-pay | Admitting: Sports Medicine

## 2021-02-08 DIAGNOSIS — M545 Low back pain, unspecified: Secondary | ICD-10-CM

## 2021-02-08 MED ORDER — TRAMADOL HCL 50 MG PO TABS: 50.0000 mg | ORAL_TABLET | Freq: Three times a day (TID) | ORAL | 0 refills | Status: DC | PRN

## 2021-02-17 ENCOUNTER — Ambulatory Visit (INDEPENDENT_AMBULATORY_CARE_PROVIDER_SITE_OTHER): Payer: 59 | Admitting: Professional

## 2021-02-17 ENCOUNTER — Ambulatory Visit: Payer: 59 | Admitting: Professional

## 2021-02-17 ENCOUNTER — Other Ambulatory Visit: Payer: Self-pay

## 2021-02-17 DIAGNOSIS — F331 Major depressive disorder, recurrent, moderate: Secondary | ICD-10-CM | POA: Diagnosis not present

## 2021-02-23 ENCOUNTER — Other Ambulatory Visit (HOSPITAL_BASED_OUTPATIENT_CLINIC_OR_DEPARTMENT_OTHER): Payer: Self-pay

## 2021-02-23 ENCOUNTER — Ambulatory Visit (INDEPENDENT_AMBULATORY_CARE_PROVIDER_SITE_OTHER): Payer: 59 | Admitting: Professional

## 2021-02-23 ENCOUNTER — Telehealth: Payer: Self-pay | Admitting: Sports Medicine

## 2021-02-23 DIAGNOSIS — Z20828 Contact with and (suspected) exposure to other viral communicable diseases: Secondary | ICD-10-CM

## 2021-02-23 DIAGNOSIS — F331 Major depressive disorder, recurrent, moderate: Secondary | ICD-10-CM

## 2021-02-23 HISTORY — DX: Contact with and (suspected) exposure to other viral communicable diseases: Z20.828

## 2021-02-23 MED ORDER — OSELTAMIVIR PHOSPHATE 75 MG PO CAPS
75.0000 mg | ORAL_CAPSULE | Freq: Two times a day (BID) | ORAL | 0 refills | Status: DC
  Filled 2021-02-23: qty 10, 5d supply, fill #0

## 2021-02-23 NOTE — Telephone Encounter (Signed)
Exposure to influenza Exposure influenza, now with low-grade fevers, adding treatment dose Tamiflu.

## 2021-02-23 NOTE — Assessment & Plan Note (Signed)
Exposure influenza, now with low-grade fevers, adding treatment dose Tamiflu.

## 2021-03-03 ENCOUNTER — Ambulatory Visit (INDEPENDENT_AMBULATORY_CARE_PROVIDER_SITE_OTHER): Payer: 59 | Admitting: Professional

## 2021-03-03 DIAGNOSIS — F331 Major depressive disorder, recurrent, moderate: Secondary | ICD-10-CM | POA: Diagnosis not present

## 2021-03-03 DIAGNOSIS — F4322 Adjustment disorder with anxiety: Secondary | ICD-10-CM

## 2021-03-04 ENCOUNTER — Other Ambulatory Visit: Payer: Self-pay | Admitting: Medical-Surgical

## 2021-03-07 ENCOUNTER — Other Ambulatory Visit (HOSPITAL_BASED_OUTPATIENT_CLINIC_OR_DEPARTMENT_OTHER): Payer: Self-pay

## 2021-03-07 MED ORDER — LEVOCETIRIZINE DIHYDROCHLORIDE 5 MG PO TABS
ORAL_TABLET | Freq: Every evening | ORAL | 1 refills | Status: DC
  Filled 2021-03-07: qty 90, 90d supply, fill #0
  Filled 2021-06-20: qty 90, 90d supply, fill #1

## 2021-03-09 ENCOUNTER — Encounter: Payer: Self-pay | Admitting: Professional

## 2021-03-09 ENCOUNTER — Encounter: Payer: 59 | Admitting: Professional

## 2021-03-09 NOTE — Progress Notes (Signed)
This encounter was created in error - please disregard.

## 2021-03-09 NOTE — Progress Notes (Deleted)
[facility] Counselor/Therapist Progress Note  Patient ID: Kathleen Phillips, MRN: [id number],    Date: 03/09/2021  Time Spent:  minutes 103-  Treatment Type: Individual Therapy  Reported Symptoms:   Mental Status Exam: Appearance:  {PSY:22683}     Behavior: {PSY:21022743}  Motor: {PSY:22302}  Speech/Language:  {PSY:22685}  Affect: {PSY:22687}  Mood: {PSY:31886}  Thought process: {PSY:31888}  Thought content:   {PSY:786-686-8221}  Sensory/Perceptual disturbances:   {PSY:970-509-5726}  Orientation: {PSY:30297}  Attention: {PSY:22877}  Concentration: {PSY:513-484-1040}  Memory: {PSY:9543722373}  Fund of knowledge:  {PSY:513-484-1040}  Insight:   {PSY:513-484-1040}  Judgment:  {PSY:513-484-1040}  Impulse Control: {PSY:513-484-1040}   Risk Assessment: Danger to Self:  {PSY:22692} Self-injurious Behavior: {PSY:22692} Danger to Others: {PSY:22692} Duty to Warn:{PSY:311194} Physical Aggression / Violence:{PSY:21197} Access to Firearms a concern: {PSY:21197} Gang Involvement:{PSY:21197}  Subjective:   Interventions: {PSY:317-249-2836}  Diagnosis:Major depressive disorder, recurrent episode, moderate (HCC)  Plan:  -next appt: Tuesday 03-15-2021 @ 3 pm  Teofilo Pod, [facility]    This encounter was created in error - please disregard.

## 2021-03-10 ENCOUNTER — Other Ambulatory Visit: Payer: Self-pay | Admitting: Physician Assistant

## 2021-03-10 DIAGNOSIS — F332 Major depressive disorder, recurrent severe without psychotic features: Secondary | ICD-10-CM

## 2021-03-10 DIAGNOSIS — F3181 Bipolar II disorder: Secondary | ICD-10-CM

## 2021-03-11 ENCOUNTER — Other Ambulatory Visit (HOSPITAL_BASED_OUTPATIENT_CLINIC_OR_DEPARTMENT_OTHER): Payer: Self-pay

## 2021-03-11 MED ORDER — CITALOPRAM HYDROBROMIDE 10 MG PO TABS
ORAL_TABLET | ORAL | 3 refills | Status: DC
  Filled 2021-03-11: qty 90, 90d supply, fill #0

## 2021-03-11 MED ORDER — VILAZODONE HCL 40 MG PO TABS
ORAL_TABLET | ORAL | 3 refills | Status: DC
  Filled 2021-03-11: qty 90, 90d supply, fill #0
  Filled 2021-08-15: qty 90, 90d supply, fill #1
  Filled 2021-10-17 – 2021-12-16 (×2): qty 90, 90d supply, fill #2

## 2021-03-15 ENCOUNTER — Ambulatory Visit (INDEPENDENT_AMBULATORY_CARE_PROVIDER_SITE_OTHER): Payer: 59 | Admitting: Professional

## 2021-03-15 ENCOUNTER — Encounter: Payer: Self-pay | Admitting: Professional

## 2021-03-15 DIAGNOSIS — F331 Major depressive disorder, recurrent, moderate: Secondary | ICD-10-CM
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
## 2021-03-16 ENCOUNTER — Encounter: Payer: Self-pay | Admitting: Professional

## 2021-03-18 ENCOUNTER — Other Ambulatory Visit: Payer: Self-pay | Admitting: Physician Assistant

## 2021-03-18 ENCOUNTER — Other Ambulatory Visit (HOSPITAL_BASED_OUTPATIENT_CLINIC_OR_DEPARTMENT_OTHER): Payer: Self-pay

## 2021-03-21 ENCOUNTER — Other Ambulatory Visit (HOSPITAL_BASED_OUTPATIENT_CLINIC_OR_DEPARTMENT_OTHER): Payer: Self-pay

## 2021-03-21 MED ORDER — SAXENDA 18 MG/3ML ~~LOC~~ SOPN
3.0000 mg | PEN_INJECTOR | Freq: Every day | SUBCUTANEOUS | 1 refills | Status: DC
  Filled 2021-03-21: qty 15, 30d supply, fill #0

## 2021-03-22 ENCOUNTER — Ambulatory Visit (INDEPENDENT_AMBULATORY_CARE_PROVIDER_SITE_OTHER): Payer: 59 | Admitting: Professional

## 2021-03-22 ENCOUNTER — Encounter: Payer: Self-pay | Admitting: Professional

## 2021-03-22 DIAGNOSIS — F4322 Adjustment disorder with anxiety: Secondary | ICD-10-CM | POA: Insufficient documentation

## 2021-03-22 DIAGNOSIS — F331 Major depressive disorder, recurrent, moderate: Secondary | ICD-10-CM | POA: Diagnosis not present

## 2021-03-22 HISTORY — DX: Adjustment disorder with anxiety: F43.22
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
## 2021-03-23 ENCOUNTER — Other Ambulatory Visit (HOSPITAL_BASED_OUTPATIENT_CLINIC_OR_DEPARTMENT_OTHER): Payer: Self-pay

## 2021-04-08 ENCOUNTER — Encounter: Payer: Self-pay | Admitting: Physician Assistant

## 2021-04-08 ENCOUNTER — Other Ambulatory Visit (HOSPITAL_BASED_OUTPATIENT_CLINIC_OR_DEPARTMENT_OTHER): Payer: Self-pay

## 2021-04-08 ENCOUNTER — Ambulatory Visit (INDEPENDENT_AMBULATORY_CARE_PROVIDER_SITE_OTHER): Payer: 59 | Admitting: Physician Assistant

## 2021-04-08 ENCOUNTER — Other Ambulatory Visit: Payer: Self-pay

## 2021-04-08 VITALS — BP 105/54 | HR 80 | Ht 69.0 in | Wt 177.4 lb

## 2021-04-08 DIAGNOSIS — F3181 Bipolar II disorder: Secondary | ICD-10-CM | POA: Diagnosis not present

## 2021-04-08 DIAGNOSIS — E663 Overweight: Secondary | ICD-10-CM | POA: Diagnosis not present

## 2021-04-08 MED ORDER — SAXENDA 18 MG/3ML ~~LOC~~ SOPN
3.0000 mg | PEN_INJECTOR | Freq: Every day | SUBCUTANEOUS | 2 refills | Status: DC
  Filled 2021-04-08 – 2021-05-30 (×2): qty 15, 30d supply, fill #0
  Filled 2021-08-02: qty 15, 30d supply, fill #1
  Filled 2021-09-15: qty 15, 30d supply, fill #2

## 2021-04-08 MED ORDER — CARIPRAZINE HCL 1.5 MG PO CAPS
1.5000 mg | ORAL_CAPSULE | Freq: Every day | ORAL | 1 refills | Status: DC
Start: 2021-04-08 — End: 2021-10-10
  Filled 2021-04-08 – 2021-06-20 (×2): qty 90, 90d supply, fill #0
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
## 2021-04-08 NOTE — Patient Instructions (Signed)
Stop celexa. Follow up in 4 weeks.

## 2021-04-11 ENCOUNTER — Ambulatory Visit (INDEPENDENT_AMBULATORY_CARE_PROVIDER_SITE_OTHER): Payer: 59 | Admitting: Professional

## 2021-04-11 ENCOUNTER — Encounter: Payer: Self-pay | Admitting: Professional

## 2021-04-11 DIAGNOSIS — F4322 Adjustment disorder with anxiety: Secondary | ICD-10-CM

## 2021-04-11 DIAGNOSIS — F331 Major depressive disorder, recurrent, moderate: Secondary | ICD-10-CM
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
## 2021-04-12 ENCOUNTER — Telehealth: Payer: Self-pay

## 2021-04-12 NOTE — Telephone Encounter (Signed)
Medication: Liraglutide -Weight Management (SAXENDA) 18 MG/3ML SOPN Prior authorization submitted via CoverMyMeds on 04/12/2021 [place] submission pending

## 2021-04-12 NOTE — Telephone Encounter (Signed)
Medication: Liraglutide -Weight Management (SAXENDA) 18 MG/3ML SOPN Prior authorization determination received Medication has been approved Approval dates: 04/12/2021-04/11/2022  Patient aware via: MyChart Provider aware via this encounter

## 2021-04-13 ENCOUNTER — Encounter: Payer: Self-pay | Admitting: Physician Assistant

## 2021-04-13 ENCOUNTER — Other Ambulatory Visit (HOSPITAL_BASED_OUTPATIENT_CLINIC_OR_DEPARTMENT_OTHER): Payer: Self-pay

## 2021-04-13 DIAGNOSIS — M79642 Pain in left hand: Secondary | ICD-10-CM

## 2021-04-13 DIAGNOSIS — M79641 Pain in right hand: Secondary | ICD-10-CM

## 2021-04-14 ENCOUNTER — Ambulatory Visit (INDEPENDENT_AMBULATORY_CARE_PROVIDER_SITE_OTHER): Payer: 59

## 2021-04-14 ENCOUNTER — Other Ambulatory Visit: Payer: Self-pay

## 2021-04-14 DIAGNOSIS — M79641 Pain in right hand: Secondary | ICD-10-CM | POA: Diagnosis not present

## 2021-04-14 DIAGNOSIS — M79642 Pain in left hand: Secondary | ICD-10-CM

## 2021-04-14 NOTE — Telephone Encounter (Signed)
Labs ordered.

## 2021-04-15 ENCOUNTER — Other Ambulatory Visit (HOSPITAL_BASED_OUTPATIENT_CLINIC_OR_DEPARTMENT_OTHER): Payer: Self-pay

## 2021-04-15 MED ORDER — MELOXICAM 15 MG PO TABS
15.0000 mg | ORAL_TABLET | Freq: Every day | ORAL | 1 refills | Status: DC
  Filled 2021-04-15: qty 30, 30d supply, fill #0
  Filled 2021-05-30: qty 30, 30d supply, fill #1

## 2021-04-15 NOTE — Progress Notes (Signed)
Left normal.

## 2021-04-15 NOTE — Addendum Note (Signed)
Addended bySilvio Pate on: 04/15/2021 10:35 AM   Modules accepted: Orders

## 2021-04-15 NOTE — Progress Notes (Signed)
Rheumatoid panel is normal. Looks like more inflammatory changes. Have you tried anti-inflammatories?

## 2021-04-15 NOTE — Progress Notes (Signed)
Looks like mild features of non-erosive degenerative changes.

## 2021-04-18 ENCOUNTER — Ambulatory Visit (INDEPENDENT_AMBULATORY_CARE_PROVIDER_SITE_OTHER): Payer: 59 | Admitting: Professional

## 2021-04-18 ENCOUNTER — Encounter: Payer: Self-pay | Admitting: Professional

## 2021-04-18 DIAGNOSIS — F331 Major depressive disorder, recurrent, moderate: Secondary | ICD-10-CM

## 2021-04-18 DIAGNOSIS — F4322 Adjustment disorder with anxiety: Secondary | ICD-10-CM | POA: Diagnosis not present

## 2021-04-18 LAB — RHEUMATOID FACTOR: Rheumatoid fact SerPl-aCnc: <14 IU/mL (ref <14)

## 2021-04-18 LAB — ANA: Anti Nuclear Antibody (ANA): NEGATIVE

## 2021-04-18 LAB — C-REACTIVE PROTEIN: CRP: 0.3 mg/L (ref <8.0)

## 2021-04-18 LAB — SEDIMENTATION RATE: Sed Rate: 2 mm/h (ref 0–20)

## 2021-04-18 LAB — CYCLIC CITRUL PEPTIDE ANTIBODY, IGG: Cyclic Citrullin Peptide Ab: <16 UNITS

## 2021-04-18 NOTE — Progress Notes (Signed)
° ° ° ° ° ° ° ° ° ° ° ° ° ° °  Cait Locust, LCMHC °

## 2021-04-18 NOTE — Progress Notes (Signed)
[facility] Counselor/Therapist Progress Note  Patient ID: Kathleen Phillips, MRN: [id number],    Date: 04/18/2021  Time Spent: 45 minutes 102-147 pm  Treatment Type: Individual Therapy  Reported Symptoms: sad, conflicted  Mental Status Exam: Appearance:  Well Groomed     Behavior: Sharing, Rationalizing, and Care-Taking  Motor: Normal  Speech/Language:  Clear and Coherent and Normal Rate  Affect: Full Range  Mood: sad  Thought process: normal  Thought content:   WNL  Sensory/Perceptual disturbances:   WNL  Orientation: oriented to person, place, time/date, and situation  Attention: Good  Concentration: Good  Memory: WNL  Fund of knowledge:  Good  Insight:   Good  Judgment:  Good  Impulse Control: Good   Risk Assessment: Danger to Self:  No Self-injurious Behavior: No Danger to Others: No Duty to Warn:no Physical Aggression / Violence:No  Access to Firearms a concern: No  Gang Involvement:No   Subjective: This session was held via video teletherapy due to the coronavirus risk at this time. The patient consented to video teletherapy and was located at her home during this session. She is aware it is the responsibility of the patient to secure confidentiality on her end of the session. The provider was in a private home office for the duration of this session.   Issues addressed: 1-marital -medications have changed -he is doing much better -she has not discussed yet with him to give him some more time to adapt -she does not go back to address issues that occur because he just becomes angry   -she is hopeful that with the medication changes that he can see some of the things he needs to change   -right time? 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   -she likes the idea of learning a new part of the business -patient feels optimistic and apprehensive all at the same time   -apprehensive because of the CMA's doing prior auths -she plans to talk with Deirdre about ensuring ortho-disk -Engineer, maintenance leaving as well   -Jeri Lager is being trained as interim   -rumor has it that they will be hiring for position outside agency     -last 2-3 coordinators were hired from within and left within a year 3-getting ready to start school today -taking statistics   -was dreading uni she began watching beginning videos     -how to handle anxiety and fear     -diligence     -it doesn't matter how good you are at math -she will be doing one section each day and Sunday to catch up if she gets behind -she ordered the book   -she has already ready the sections, too her notes, and highlighted formulas for week one 4-social -went to ladies bible study last week and it was good 5-reviewed measurable objectives -patient has made progress in areas that have been addressed  Problems Addressed  Anxiety, Grief / Loss Unresolved, Unipolar Depression Goals 1. Alleviate depressive symptoms and return to previous level of effective functioning. 2. Begin a healthy grieving process around the loss. Objective Reengage in activities with family, friends, coworkers, and others. Target Date: 2022-02-22 Frequency:  Weekly  Progress: 30 Modality: individual  Related Interventions Assist the client in recommitting and reengaging in the primary social positive roles in which he/she has functioned prior to the loss. Objective Begin verbalizing feelings associated with the loss. Target Date: 2022-02-22 Frequency: Weekly  Progress: 10 Modality: individual  Related Interventions Assist the client in identifying and  expressing feelings connected with his/her loss. 3. Develop healthy interpersonal relationships that lead to the alleviation and help prevent the relapse of depression. Objective Increasingly verbalize hopeful and positive statements regarding self, others, and the future. Target Date: 2022-02-22 Frequency: Weekly  Progress: 10 Modality: individual  Related Interventions Teach the client more about depression and how to recognize and accept some sadness as a normal variation in feeling. Objective Learn and implement relapse prevention skills. Target Date: 2022-02-22 Frequency: Weekly  Progress: 0 Modality: individual  Related Interventions Discuss with the client the distinction between a lapse and relapse, associating a lapse with a rather common, temporary setback that may involve, for example, re-experiencing a depressive thought and/or urge to withdraw or avoid (perhaps as related to some loss or conflict) and a relapse as a sustained return to a pattern of depressive thinking and feeling usually accompanied by interpersonal withdrawal and/or avoidance. Objective Implement mindfulness techniques for relapse prevention. Target Date: 2022-02-22 Frequency: Weekly  Progress: 0 Modality: individual  Related Interventions Use mindfulness meditation and cognitive therapy techniques to help the client learn to recognize and regulate the negative thought processes associated with depression and to change his/her relationship with these thoughts (see Mindfulness-Based Cognitive Therapy for Depression by Chevis Pretty, and Shona Simpson). Objective Learn and implement behavioral strategies to overcome depression. Target Date: 2022-02-22 Frequency: Weekly  Progress: 10 Modality: individual  Related Interventions Assist the client in developing skills that increase the likelihood of deriving pleasure from behavioral activation (e.g., assertiveness skills, developing an exercise plan, less internal/more  external focus, increased social involvement); reinforce success. Objective Identify and replace thoughts and beliefs that support depression. Target Date: 2022-02-22 Frequency: Weekly  Progress: 20 Modality: individual  Related Interventions Facilitate and reinforce the client's shift from biased depressive self-talk and beliefs to reality-based cognitive messages that enhance self-confidence and increase adaptive actions (see "Positive Self-Talk" in the Adult Psychotherapy Homework Planner by Stephannie Li). Objective Verbalize an accurate understanding of depression. Target Date: 2022-02-22 Frequency: Weekly  Progress: 50 Modality: individual  Related Interventions Consistent with the treatment model, discuss how cognitive, behavioral, interpersonal, and/or other factors (e.g., family history) contribute to depression. 4. Develop healthy thinking patterns and beliefs about self, others, and the world that lead to the alleviation and help prevent the relapse of depression. 5. Learn and implement coping skills that result in a reduction of anxiety and worry, and improved daily functioning. Objective Learn and implement relapse prevention strategies for managing possible future anxiety symptoms. Target Date: 2022-02-22 Frequency: Weekly  Progress: 0 Modality: individual  Related Interventions Discuss with the client the distinction between a lapse and relapse, associating a lapse with an initial and reversible return of worry, anxiety symptoms, or urges to avoid, and relapse with the decision to continue the fearful and avoidant patterns. Objective Learn and implement personal and interpersonal skills to reduce anxiety and improve interpersonal relationships. Target Date: 2022-02-22 Frequency: Weekly  Progress: 30 Modality: individual  Related Interventions Use instruction, modeling, and role-playing to build the client's general social, communication, and/or conflict resolution  skills. Objective Identify and engage in pleasant activities on a daily basis. Target Date: 2022-02-22 Frequency: Weekly  Progress: 20  Modality: individual  Related Interventions Engage the client in behavioral activation, increasing the client's contact with sources of reward, identifying processes that inhibit activation, and teaching skills to solve life problems (or assign "Identify and Schedule Pleasant Activities" in the Adult Psychotherapy Homework Planner by Stephannie Li); use behavioral techniques such as instruction, rehearsal, role-playing, role reversal as needed to assist adoption into the client's daily life; reinforce success. Objective Learn and implement problem-solving strategies for realistically addressing worries. Target Date: 2022-02-22 Frequency: Weekly  Progress: 30 Modality: individual  Related Interventions Teach the client problem-solving strategies involving specifically defining a problem, generating options for addressing it, evaluating the pros and cons of each option, selecting and implementing an optional action, and reevaluating and refining the action (or assign "Applying Problem-Solving to Interpersonal Conflict" in the Adult Psychotherapy Homework Planner by Stephannie Li). Objective Identify, challenge, and replace biased, fearful self-talk with positive, realistic, and empowering self-talk. Target Date: 2022-02-22 Frequency: Weekly  Progress: 10 Modality: individual  Related Interventions Explore the client's schema and self-talk that mediate his/her fear response; assist him/her in challenging the biases; replace the distorted messages with reality-based alternatives and positive, realistic self-talk that will increase his/her self-confidence in coping with irrational fears (see Cognitive Therapy of Anxiety Disorders by Laurence Slate). Objective Verbalize an understanding of the role that cognitive biases play in excessive irrational worry and persistent anxiety  symptoms. Target Date: 2022-02-22 Frequency: Weekly  Progress: 0 Modality: individual  Related Interventions Assist the client in analyzing his/her worries by examining potential biases such as the probability of the negative expectation occurring, the real consequences of it occurring, his/her ability to control the outcome, the worst possible outcome, and his/her ability to accept it (see "Analyze the Probability of a Feared Event" in the Adult Psychotherapy Homework Planner by Stephannie Li; Cognitive Therapy of Anxiety Disorders by Laurence Slate). Objective Learn and implement a strategy to limit the association between various environmental settings and worry, delaying the worry until a designated "worry time." Target Date: 2022-02-22 Frequency: Weekly  Progress: 20 Modality: individual  Related Interventions Teach the client how to recognize, stop, and postpone worry to the agreed upon worry time using skills such as thought stopping, relaxation, and redirecting attention (or assign "Making Use of the Thought-Stopping Technique" and/or "Worry Time" in the Adult Psychotherapy Homework Planner by Jongsma to assist skill development); encourage use in daily life; review and reinforce success while providing corrective feedback toward improvement. Explain the rationale for using a worry time as well as how it is to be used; agree upon and implement a worry time with the client. Objective Learn and implement calming skills to reduce overall anxiety and manage anxiety symptoms. Target Date: 2022-02-22 Frequency: Weekly  Progress: 40 Modality: individual  Related Interventions Teach the client calming/relaxation skills (e.g., applied relaxation, progressive muscle relaxation, cue-controlled relaxation; mindful breathing; biofeedback) and how to discriminate better between relaxation and tension; teach the client how to apply these skills to his/her daily life (e.g., New Directions in Progressive Muscle  Relaxation by Marcelyn Ditty, and Hazlett-Stevens; Treating Generalized Anxiety Disorder by Rygh and Ida Rogue). Objective Verbalize an understanding of the cognitive, physiological, and behavioral components of anxiety and its treatment. Target Date: 2022-02-22 Frequency: Weekly  Progress: 40 Modality: individual  Related Interventions Discuss how generalized anxiety typically involves excessive worry about unrealistic threats, various bodily expressions of tension, overarousal, and hypervigilance, and avoidance of what is threatening that interact to maintain the problem (see Mastery of Your Anxiety and Worry: Therapist Guide by Dorna Bloom,  Elenora Fenderraske, Environmental education officerand Barlow; Treating Generalized Anxiety Disorder by Rygh and Ida RogueSanderson). 6. Recognize, accept, and cope with feelings of depression. 7. Reduce overall frequency, intensity, and duration of the anxiety so that daily functioning is not impaired.   Diagnosis:Major depressive disorder, recurrent episode, moderate (HCC)  Adjustment disorder with anxiety  Plan:  -being consistent with the things she has began -meet again on Monday, April 25, 2021 at 12pm  India HookKathy Joleah Kosak, [facility]

## 2021-04-21 ENCOUNTER — Other Ambulatory Visit (HOSPITAL_BASED_OUTPATIENT_CLINIC_OR_DEPARTMENT_OTHER): Payer: Self-pay

## 2021-04-21 ENCOUNTER — Other Ambulatory Visit: Payer: Self-pay | Admitting: Family Medicine

## 2021-04-21 MED ORDER — OSELTAMIVIR PHOSPHATE 75 MG PO CAPS
75.0000 mg | ORAL_CAPSULE | Freq: Every day | ORAL | 0 refills | Status: AC
  Filled 2021-04-21: qty 10, 10d supply, fill #0

## 2021-04-21 NOTE — Progress Notes (Signed)
Patient's husband + for influenza.  Requesting prophylactic medication.

## 2021-04-25 ENCOUNTER — Encounter (INDEPENDENT_AMBULATORY_CARE_PROVIDER_SITE_OTHER): Payer: 59 | Admitting: Professional

## 2021-04-25 ENCOUNTER — Encounter: Payer: Self-pay | Admitting: Professional

## 2021-04-25 DIAGNOSIS — F4322 Adjustment disorder with anxiety: Secondary | ICD-10-CM

## 2021-04-25 DIAGNOSIS — F331 Major depressive disorder, recurrent, moderate: Secondary | ICD-10-CM

## 2021-04-25 NOTE — Progress Notes (Deleted)
[facility] Counselor/Therapist Progress Note  Patient ID: Kathleen Phillips, MRN: [id number],    Date: 04/25/2021  Time Spent: 45 minutes 102-147 pm  Treatment Type: Individual Therapy  Reported Symptoms: sad, conflicted  Mental Status Exam: Appearance:  Well Groomed     Behavior: Sharing, Rationalizing, and Care-Taking  Motor: Normal  Speech/Language:  Clear and Coherent and Normal Rate  Affect: Full Range  Mood: sad  Thought process: normal  Thought content:   WNL  Sensory/Perceptual disturbances:   WNL  Orientation: oriented to person, place, time/date, and situation  Attention: Good  Concentration: Good  Memory: WNL  Fund of knowledge:  Good  Insight:   Good  Judgment:  Good  Impulse Control: Good   Risk Assessment: Danger to Self:  No Self-injurious Behavior: No Danger to Others: No Duty to Warn:no Physical Aggression / Violence:No  Access to Firearms a concern: No  Gang Involvement:No   Subjective: This session was held via video teletherapy due to the coronavirus risk at this time. The patient consented to video teletherapy and was located at her home during this session. She is aware it is the responsibility of the patient to secure confidentiality on her end of the session. The provider was in a private home office for the duration of this session.   Issues addressed: 1-marital -medications have changed -he is doing much better -she has not discussed yet with him to give him some more time to adapt -she does not go back to address issues that occur because he just becomes angry   -she is hopeful that with the medication changes that he can see some of the things he needs to change   -right time? 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   -she likes the idea of learning a new part of the business -patient feels optimistic and apprehensive all at the same time   -apprehensive because of the CMA's doing prior auths -she plans to talk with Deirdre about ensuring ortho-disk -Engineer, maintenance leaving as well   -Jeri Lager is being trained as interim   -rumor has it that they will be hiring for position outside agency     -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 coordinators were hired from within and left within a year 3-getting ready to start school today -taking statistics   -was dreading uni she began watching beginning videos     -how to handle anxiety and fear     -diligence     -it doesn't matter how good you are at math -she will be doing one section each day and Sunday to catch up if she gets behind -she ordered the book   -she has already ready the sections, too her notes, and highlighted formulas for week one 4-social -went to ladies bible study last week and it was good 5-reviewed measurable objectives -patient has made progress in areas that have been addressed  Problems Addressed  Anxiety, Grief / Loss Unresolved, Unipolar Depression Goals 1. Alleviate depressive symptoms and return to previous level of effective functioning. 2. Begin a healthy grieving process around the loss. Objective Reengage in activities with family, friends, coworkers, and others. Target Date: 2022-02-22 Frequency:  Weekly  Progress: 30 Modality: individual  Related Interventions Assist the client in recommitting and reengaging in the primary social positive roles in which he/she has functioned prior to the loss. Objective Begin verbalizing feelings associated with the loss. Target Date: 2022-02-22 Frequency: Weekly  Progress: 10 Modality: individual  Related Interventions Assist the client in identifying and  expressing feelings connected with his/her loss. 3. Develop healthy interpersonal relationships that lead to the alleviation and help prevent the relapse of depression. Objective Increasingly verbalize hopeful and positive statements regarding self, others, and the future. Target Date: 2022-02-22 Frequency: Weekly  Progress: 10 Modality: individual  Related Interventions Teach the client more about depression and how to recognize and accept some sadness as a normal variation in feeling. Objective Learn and implement relapse prevention skills. Target Date: 2022-02-22 Frequency: Weekly  Progress: 0 Modality: individual  Related Interventions Discuss with the client the distinction between a lapse and relapse, associating a lapse with a rather common, temporary setback that may involve, for example, re-experiencing a depressive thought and/or urge to withdraw or avoid (perhaps as related to some loss or conflict) and a relapse as a sustained return to a pattern of depressive thinking and feeling usually accompanied by interpersonal withdrawal and/or avoidance. Objective Implement mindfulness techniques for relapse prevention. Target Date: 2022-02-22 Frequency: Weekly  Progress: 0 Modality: individual  Related Interventions Use mindfulness meditation and cognitive therapy techniques to help the client learn to recognize and regulate the negative thought processes associated with depression and to change his/her relationship with these thoughts (see Mindfulness-Based Cognitive Therapy for Depression by Chevis Pretty, and Shona Simpson). Objective Learn and implement behavioral strategies to overcome depression. Target Date: 2022-02-22 Frequency: Weekly  Progress: 10 Modality: individual  Related Interventions Assist the client in developing skills that increase the likelihood of deriving pleasure from behavioral activation (e.g., assertiveness skills, developing an exercise plan, less internal/more  external focus, increased social involvement); reinforce success. Objective Identify and replace thoughts and beliefs that support depression. Target Date: 2022-02-22 Frequency: Weekly  Progress: 20 Modality: individual  Related Interventions Facilitate and reinforce the client's shift from biased depressive self-talk and beliefs to reality-based cognitive messages that enhance self-confidence and increase adaptive actions (see "Positive Self-Talk" in the Adult Psychotherapy Homework Planner by Stephannie Li). Objective Verbalize an accurate understanding of depression. Target Date: 2022-02-22 Frequency: Weekly  Progress: 50 Modality: individual  Related Interventions Consistent with the treatment model, discuss how cognitive, behavioral, interpersonal, and/or other factors (e.g., family history) contribute to depression. 4. Develop healthy thinking patterns and beliefs about self, others, and the world that lead to the alleviation and help prevent the relapse of depression. 5. Learn and implement coping skills that result in a reduction of anxiety and worry, and improved daily functioning. Objective Learn and implement relapse prevention strategies for managing possible future anxiety symptoms. Target Date: 2022-02-22 Frequency: Weekly  Progress: 0 Modality: individual  Related Interventions Discuss with the client the distinction between a lapse and relapse, associating a lapse with an initial and reversible return of worry, anxiety symptoms, or urges to avoid, and relapse with the decision to continue the fearful and avoidant patterns. Objective Learn and implement personal and interpersonal skills to reduce anxiety and improve interpersonal relationships. Target Date: 2022-02-22 Frequency: Weekly  Progress: 30 Modality: individual  Related Interventions Use instruction, modeling, and role-playing to build the client's general social, communication, and/or conflict resolution  skills. Objective Identify and engage in pleasant activities on a daily basis. Target Date: 2022-02-22 Frequency: Weekly  Progress: 20  Modality: individual  Related Interventions Engage the client in behavioral activation, increasing the client's contact with sources of reward, identifying processes that inhibit activation, and teaching skills to solve life problems (or assign "Identify and Schedule Pleasant Activities" in the Adult Psychotherapy Homework Planner by Stephannie Li); use behavioral techniques such as instruction, rehearsal, role-playing, role reversal as needed to assist adoption into the client's daily life; reinforce success. Objective Learn and implement problem-solving strategies for realistically addressing worries. Target Date: 2022-02-22 Frequency: Weekly  Progress: 30 Modality: individual  Related Interventions Teach the client problem-solving strategies involving specifically defining a problem, generating options for addressing it, evaluating the pros and cons of each option, selecting and implementing an optional action, and reevaluating and refining the action (or assign "Applying Problem-Solving to Interpersonal Conflict" in the Adult Psychotherapy Homework Planner by Stephannie Li). Objective Identify, challenge, and replace biased, fearful self-talk with positive, realistic, and empowering self-talk. Target Date: 2022-02-22 Frequency: Weekly  Progress: 10 Modality: individual  Related Interventions Explore the client's schema and self-talk that mediate his/her fear response; assist him/her in challenging the biases; replace the distorted messages with reality-based alternatives and positive, realistic self-talk that will increase his/her self-confidence in coping with irrational fears (see Cognitive Therapy of Anxiety Disorders by Laurence Slate). Objective Verbalize an understanding of the role that cognitive biases play in excessive irrational worry and persistent anxiety  symptoms. Target Date: 2022-02-22 Frequency: Weekly  Progress: 0 Modality: individual  Related Interventions Assist the client in analyzing his/her worries by examining potential biases such as the probability of the negative expectation occurring, the real consequences of it occurring, his/her ability to control the outcome, the worst possible outcome, and his/her ability to accept it (see "Analyze the Probability of a Feared Event" in the Adult Psychotherapy Homework Planner by Stephannie Li; Cognitive Therapy of Anxiety Disorders by Laurence Slate). Objective Learn and implement a strategy to limit the association between various environmental settings and worry, delaying the worry until a designated "worry time." Target Date: 2022-02-22 Frequency: Weekly  Progress: 20 Modality: individual  Related Interventions Teach the client how to recognize, stop, and postpone worry to the agreed upon worry time using skills such as thought stopping, relaxation, and redirecting attention (or assign "Making Use of the Thought-Stopping Technique" and/or "Worry Time" in the Adult Psychotherapy Homework Planner by Jongsma to assist skill development); encourage use in daily life; review and reinforce success while providing corrective feedback toward improvement. Explain the rationale for using a worry time as well as how it is to be used; agree upon and implement a worry time with the client. Objective Learn and implement calming skills to reduce overall anxiety and manage anxiety symptoms. Target Date: 2022-02-22 Frequency: Weekly  Progress: 40 Modality: individual  Related Interventions Teach the client calming/relaxation skills (e.g., applied relaxation, progressive muscle relaxation, cue-controlled relaxation; mindful breathing; biofeedback) and how to discriminate better between relaxation and tension; teach the client how to apply these skills to his/her daily life (e.g., New Directions in Progressive Muscle  Relaxation by Marcelyn Ditty, and Hazlett-Stevens; Treating Generalized Anxiety Disorder by Rygh and Ida Rogue). Objective Verbalize an understanding of the cognitive, physiological, and behavioral components of anxiety and its treatment. Target Date: 2022-02-22 Frequency: Weekly  Progress: 40 Modality: individual  Related Interventions Discuss how generalized anxiety typically involves excessive worry about unrealistic threats, various bodily expressions of tension, overarousal, and hypervigilance, and avoidance of what is threatening that interact to maintain the problem (see Mastery of Your Anxiety and Worry: Therapist Guide by Dorna Bloom,  Elenora Fenderraske, Environmental education officerand Barlow; Treating Generalized Anxiety Disorder by Rygh and Ida RogueSanderson). 6. Recognize, accept, and cope with feelings of depression. 7. Reduce overall frequency, intensity, and duration of the anxiety so that daily functioning is not impaired.   Diagnosis:No diagnosis found.  Plan:  -being consistent with the things she has began -meet again on Monday, April 25, 2021 at 12pm  Teofilo PodKathy Virginie Josten, [facility]                  [place] Park[place]athy Markeem Noreen, [facility]

## 2021-04-25 NOTE — Progress Notes (Signed)
This encounter was created in error - please disregard. This encounter was created in error - please disregard. This encounter was created in error - please disregard. 

## 2021-04-27 ENCOUNTER — Ambulatory Visit: Payer: 59 | Admitting: Professional

## 2021-05-02 ENCOUNTER — Encounter: Payer: Self-pay | Admitting: Professional

## 2021-05-02 ENCOUNTER — Ambulatory Visit (INDEPENDENT_AMBULATORY_CARE_PROVIDER_SITE_OTHER): Payer: 59 | Admitting: Professional

## 2021-05-02 DIAGNOSIS — F4322 Adjustment disorder with anxiety: Secondary | ICD-10-CM

## 2021-05-02 DIAGNOSIS — F331 Major depressive disorder, recurrent, moderate: Secondary | ICD-10-CM | POA: Diagnosis not present

## 2021-05-02 NOTE — Progress Notes (Signed)
[place] Counselor/Therapist Progress Note  Patient ID: Kathleen Phillips, MRN: [id number],    Date: 05/02/2021  Time Spent: 45 minutes 1-145 pm  Treatment Type: Individual Therapy  Reported Symptoms: sad, tearful, preoccupied, fearful  Mental Status Exam: Appearance:  Well Groomed     Behavior: Sharing, Rationalizing, and Care-Taking  Motor: Normal  Speech/Language:  Clear and Coherent and Normal Rate  Affect: Full Range  Mood: sad  Thought process: normal  Thought content:   WNL  Sensory/Perceptual disturbances:   WNL  Orientation: oriented to person, place, time/date, and situation  Attention: Good  Concentration: Good  Memory: WNL  Fund of knowledge:  Good  Insight:   Good  Judgment:  Good  Impulse Control: Good   Risk Assessment: Danger to Self:  No Self-injurious Behavior: No Danger to Others: No Duty to Warn:no Physical Aggression / Violence:No  Access to Firearms a concern: No  Gang Involvement:No   Subjective: This session was held via video teletherapy due to the coronavirus risk at this time. The patient consented to video teletherapy and was located at her home during this session. She is aware it is the responsibility of the patient to secure confidentiality on her end of the session. The provider was in a private home office for the duration of this session.   Issues addressed: 1-marital -husband [name] was admitted to medical unit at [facility] -he is very sick   -GI bleed   -has been diagnosed with cirrhosis -pt reports he's dying   -"he is a time clock, a time bomb waiting, I don't know how much time I have with him"   -his dad died of cirrhosis seven years ago   -his daughter, mother, and the pt are realizing they will need to deal with this again   -her spouse has minimized significance   -she talked to him last night about getting a will done once he is discharged 2-self-care -pt very preoccupied and cried so much she was sick  on her stomach -pt still attempting to get her homework completed -pt set a boundary with her spouse -pt engaged self-care over weekend before going to visit spouse in hospital 3-professional -has gotten her work done -feels supported by employer -has been able to focus -discussed potential for prior auth position   -was told that they were going in a different direction     -she had talked to Dr. Darene Lamer and he was supportive of her taking that position   -pt does feel discouraged   -they want her to do ortho-disk on Thursday afternoons     -pt states it is more time intensive then one afternoon per week     -pt reports it involves constant communication     Problems Addressed  Anxiety, Grief / Loss Unresolved, Unipolar Depression Goals 1. Alleviate depressive symptoms and return to previous level of effective functioning. 2. Begin a healthy grieving process around the loss. Objective Reengage in activities with family, friends, coworkers, and others. Target Date: 2022-02-22 Frequency: Weekly  Progress: 30 Modality: individual  Related Interventions Assist the client in recommitting and reengaging in the primary social positive roles in which he/she has functioned prior to the loss. Objective Begin verbalizing feelings associated with the loss. Target Date: 2022-02-22 Frequency: Weekly  Progress: 10 Modality: individual  Related Interventions Assist the client in identifying and expressing feelings connected with his/her loss. 3. Develop healthy interpersonal relationships that lead to the alleviation and help prevent the relapse of depression.  Objective Increasingly verbalize hopeful and positive statements regarding self, others, and the future. Target Date: 2022-02-22 Frequency: Weekly  Progress: 10 Modality: individual  Related Interventions Teach the client more about depression and how to recognize and accept some sadness as a normal variation in feeling. Objective Learn  and implement relapse prevention skills. Target Date: 2022-02-22 Frequency: Weekly  Progress: 0 Modality: individual  Related Interventions Discuss with the client the distinction between a lapse and relapse, associating a lapse with a rather common, temporary setback that may involve, for example, re-experiencing a depressive thought and/or urge to withdraw or avoid (perhaps as related to some loss or conflict) and a relapse as a sustained return to a pattern of depressive thinking and feeling usually accompanied by interpersonal withdrawal and/or avoidance. Objective Implement mindfulness techniques for relapse prevention. Target Date: 2022-02-22 Frequency: Weekly  Progress: 0 Modality: individual  Related Interventions Use mindfulness meditation and cognitive therapy techniques to help the client learn to recognize and regulate the negative thought processes associated with depression and to change his/her relationship with these thoughts (see Mindfulness-Based Cognitive Therapy for Depression by Walden Field, and Clyde Canterbury). Objective Learn and implement behavioral strategies to overcome depression. Target Date: 2022-02-22 Frequency: Weekly  Progress: 10 Modality: individual  Related Interventions Assist the client in developing skills that increase the likelihood of deriving pleasure from behavioral activation (e.g., assertiveness skills, developing an exercise plan, less internal/more external focus, increased social involvement); reinforce success. Objective Identify and replace thoughts and beliefs that support depression. Target Date: 2022-02-22 Frequency: Weekly  Progress: 20 Modality: individual  Related Interventions Facilitate and reinforce the client's shift from biased depressive self-talk and beliefs to reality-based cognitive messages that enhance self-confidence and increase adaptive actions (see "Positive Self-Talk" in the Adult Psychotherapy Homework Planner by  Bryn Gulling). Objective Verbalize an accurate understanding of depression. Target Date: 2022-02-22 Frequency: Weekly  Progress: 50 Modality: individual  Related Interventions Consistent with the treatment model, discuss how cognitive, behavioral, interpersonal, and/or other factors (e.g., family history) contribute to depression. 4. Develop healthy thinking patterns and beliefs about self, others, and the world that lead to the alleviation and help prevent the relapse of depression. 5. Learn and implement coping skills that result in a reduction of anxiety and worry, and improved daily functioning. Objective Learn and implement relapse prevention strategies for managing possible future anxiety symptoms. Target Date: 2022-02-22 Frequency: Weekly  Progress: 0 Modality: individual  Related Interventions Discuss with the client the distinction between a lapse and relapse, associating a lapse with an initial and reversible return of worry, anxiety symptoms, or urges to avoid, and relapse with the decision to continue the fearful and avoidant patterns. Objective Learn and implement personal and interpersonal skills to reduce anxiety and improve interpersonal relationships. Target Date: 2022-02-22 Frequency: Weekly  Progress: 30 Modality: individual  Related Interventions Use instruction, modeling, and role-playing to build the client's general social, communication, and/or conflict resolution skills. Objective Identify and engage in pleasant activities on a daily basis. Target Date: 2022-02-22 Frequency: Weekly  Progress: 20 Modality: individual  Related Interventions Engage the client in behavioral activation, increasing the client's contact with sources of reward, identifying processes that inhibit activation, and teaching skills to solve life problems (or assign "Identify and Schedule Pleasant Activities" in the Adult Psychotherapy Homework Planner by Jongsma); use behavioral techniques such as  instruction, rehearsal, role-playing, role reversal as needed to assist adoption into the client's daily life; reinforce success. Objective Learn and implement problem-solving strategies for realistically addressing worries. Target Date: 2022-02-22  Frequency: Weekly  Progress: 30 Modality: individual  Related Interventions Teach the client problem-solving strategies involving specifically defining a problem, generating options for addressing it, evaluating the pros and cons of each option, selecting and implementing an optional action, and reevaluating and refining the action (or assign "Applying Problem-Solving to Interpersonal Conflict" in the Adult Psychotherapy Homework Planner by Bryn Gulling). Objective Identify, challenge, and replace biased, fearful self-talk with positive, realistic, and empowering self-talk. Target Date: 2022-02-22 Frequency: Weekly  Progress: 10 Modality: individual  Related Interventions Explore the client's schema and self-talk that mediate his/her fear response; assist him/her in challenging the biases; replace the distorted messages with reality-based alternatives and positive, realistic self-talk that will increase his/her self-confidence in coping with irrational fears (see Cognitive Therapy of Anxiety Disorders by Alison Stalling). Objective Verbalize an understanding of the role that cognitive biases play in excessive irrational worry and persistent anxiety symptoms. Target Date: 2022-02-22 Frequency: Weekly  Progress: 0 Modality: individual  Related Interventions Assist the client in analyzing his/her worries by examining potential biases such as the probability of the negative expectation occurring, the real consequences of it occurring, his/her ability to control the outcome, the worst possible outcome, and his/her ability to accept it (see "Analyze the Probability of a Feared Event" in the Adult Psychotherapy Homework Planner by Bryn Gulling; Cognitive Therapy of  Anxiety Disorders by Alison Stalling). Objective Learn and implement a strategy to limit the association between various environmental settings and worry, delaying the worry until a designated "worry time." Target Date: 2022-02-22 Frequency: Weekly  Progress: 20 Modality: individual  Related Interventions Teach the client how to recognize, stop, and postpone worry to the agreed upon worry time using skills such as thought stopping, relaxation, and redirecting attention (or assign "Making Use of the Thought-Stopping Technique" and/or "Worry Time" in the Adult Psychotherapy Homework Planner by Jongsma to assist skill development); encourage use in daily life; review and reinforce success while providing corrective feedback toward improvement. Explain the rationale for using a worry time as well as how it is to be used; agree upon and implement a worry time with the client. Objective Learn and implement calming skills to reduce overall anxiety and manage anxiety symptoms. Target Date: 2022-02-22 Frequency: Weekly  Progress: 40 Modality: individual  Related Interventions Teach the client calming/relaxation skills (e.g., applied relaxation, progressive muscle relaxation, cue-controlled relaxation; mindful breathing; biofeedback) and how to discriminate better between relaxation and tension; teach the client how to apply these skills to his/her daily life (e.g., New Directions in Progressive Muscle Relaxation by Casper Harrison, and Hazlett-Stevens; Treating Generalized Anxiety Disorder by Rygh and Amparo Bristol). Objective Verbalize an understanding of the cognitive, physiological, and behavioral components of anxiety and its treatment. Target Date: 2022-02-22 Frequency: Weekly  Progress: 40 Modality: individual  Related Interventions Discuss how generalized anxiety typically involves excessive worry about unrealistic threats, various bodily expressions of tension, overarousal, and hypervigilance, and  avoidance of what is threatening that interact to maintain the problem (see Mastery of Your Anxiety and Worry: Therapist Guide by Corky Mull, and Barlow; Treating Generalized Anxiety Disorder by Rygh and Amparo Bristol). 6. Recognize, accept, and cope with feelings of depression. 7. Reduce overall frequency, intensity, and duration of the anxiety so that daily functioning is not impaired.   Diagnosis:Major depressive disorder, recurrent episode, moderate ([place])  Adjustment disorder with anxiety  Plan:  -meet again on Monday, May 09, 2021 at 12pm  [place], [facility]  Keiva Dina, LCMHC °

## 2021-05-08 ENCOUNTER — Other Ambulatory Visit: Payer: Self-pay | Admitting: Physician Assistant

## 2021-05-08 DIAGNOSIS — G43009 Migraine without aura, not intractable, without status migrainosus: Secondary | ICD-10-CM

## 2021-05-09 ENCOUNTER — Other Ambulatory Visit (HOSPITAL_BASED_OUTPATIENT_CLINIC_OR_DEPARTMENT_OTHER): Payer: Self-pay

## 2021-05-09 ENCOUNTER — Ambulatory Visit: Payer: 59 | Admitting: Professional

## 2021-05-09 MED ORDER — TOPIRAMATE 100 MG PO TABS
ORAL_TABLET | ORAL | 1 refills | Status: DC
  Filled 2021-05-09 – 2021-05-30 (×2): qty 135, 90d supply, fill #0

## 2021-05-16 ENCOUNTER — Ambulatory Visit (INDEPENDENT_AMBULATORY_CARE_PROVIDER_SITE_OTHER): Payer: 59 | Admitting: Professional

## 2021-05-16 ENCOUNTER — Other Ambulatory Visit (HOSPITAL_BASED_OUTPATIENT_CLINIC_OR_DEPARTMENT_OTHER): Payer: Self-pay

## 2021-05-16 ENCOUNTER — Encounter: Payer: Self-pay | Admitting: Professional

## 2021-05-16 DIAGNOSIS — F4322 Adjustment disorder with anxiety: Secondary | ICD-10-CM | POA: Diagnosis not present

## 2021-05-16 DIAGNOSIS — F331 Major depressive disorder, recurrent, moderate: Secondary | ICD-10-CM

## 2021-05-16 NOTE — Progress Notes (Signed)
[facility] Counselor/Therapist Progress Note  Patient ID: Kathleen Phillips, MRN: [id number],    Date: 05/16/2021  Time Spent: 43 minutes 03-1242 pm  Treatment Type: Individual Therapy  Reported Symptoms: calm  Mental Status Exam: Appearance:  Well Groomed     Behavior: Cooperative, engaged  Motor: Normal  Speech/Language:  Clear and Coherent and Normal Rate  Affect: Full Range  Mood: sad  Thought process: normal  Thought content:   WNL  Sensory/Perceptual disturbances:   WNL  Orientation: oriented to person, place, time/date, and situation  Attention: Good  Concentration: Good  Memory: WNL  Fund of knowledge:  Good  Insight:   Good  Judgment:  Good  Impulse Control: Good   Risk Assessment: Danger to Self:  No Self-injurious Behavior: No Danger to Others: No Duty to Warn:no Physical Aggression / Violence:No  Access to Firearms a concern: No  Gang Involvement:No   Subjective: This session was held via video teletherapy due to the coronavirus risk at this time. The patient consented to video teletherapy and was located at her home during this session. She is aware it is the responsibility of the patient to secure confidentiality on her end of the session. The provider was in a private home office for the duration of this session.   The patient arrived on time for her session seated in her car.  Issues addressed: 1-mood -reports she did not sleep well due to cough so she is tired today -denies depressed mood, no intrusive thoughts, and is not worried about anything -"I don't feel as spastic as before" -"I feel a little more under control"   -pt admits this is a new place for her since she has never felt so in control of herself 2-school -did not do good on statistics exam -she is only hoping for a B -she admits that school is generally easy   -math is her most challenging class 3-physically -still feeling dehydrated -has increased her liquid  intake 4-updated PHQ-9/GAD-7 -initial depression score on 9/27 was 13, today's score is 8 -initial anxiety score on 9/27 was 5, today's score is 2 -pt noticed that she is making progress 5-marital -husband yells at her and she permits -discussed how pt could manage differently -suggested that pt permits her spouse to speak unkind to her   -what are the consequences for him -pt acknowledges that depending upon [name]'s moods that will impact her -pt admits that if he is in a bad mood then it puts her on egg shells   -if she calls to let him know she is leaving work   -she can tell by his tone of voice  Problems Addressed  Anxiety, Grief / Loss Unresolved, Unipolar Depression Goals 1. Alleviate depressive symptoms and return to previous level of effective functioning. 2. Begin a healthy grieving process around the loss. Objective Reengage in activities with family, friends, coworkers, and others. Target Date: 2022-02-22 Frequency: Weekly  Progress: 30 Modality: individual  Related Interventions Assist the client in recommitting and reengaging in the primary social positive roles in which he/she has functioned prior to the loss. Objective Begin verbalizing feelings associated with the loss. Target Date: 2022-02-22 Frequency: Weekly  Progress: 10 Modality: individual  Related Interventions Assist the client in identifying and expressing feelings connected with his/her loss. 3. Develop healthy interpersonal relationships that lead to the alleviation and help prevent the relapse of depression. Objective Increasingly verbalize hopeful and positive statements regarding self, others, and the future. Target Date: 2022-02-22 Frequency: Weekly  Progress: 10 Modality: individual  Related Interventions Teach the client more about depression and how to recognize and accept some sadness as a normal variation in feeling. Objective Learn and implement relapse prevention skills. Target Date:  2022-02-22 Frequency: Weekly  Progress: 0 Modality: individual  Related Interventions Discuss with the client the distinction between a lapse and relapse, associating a lapse with a rather common, temporary setback that may involve, for example, re-experiencing a depressive thought and/or urge to withdraw or avoid (perhaps as related to some loss or conflict) and a relapse as a sustained return to a pattern of depressive thinking and feeling usually accompanied by interpersonal withdrawal and/or avoidance. Objective Implement mindfulness techniques for relapse prevention. Target Date: 2022-02-22 Frequency: Weekly  Progress: 0 Modality: individual  Related Interventions Use mindfulness meditation and cognitive therapy techniques to help the client learn to recognize and regulate the negative thought processes associated with depression and to change his/her relationship with these thoughts (see Mindfulness-Based Cognitive Therapy for Depression by Chevis Pretty, and Shona Simpson). Objective Learn and implement behavioral strategies to overcome depression. Target Date: 2022-02-22 Frequency: Weekly  Progress: 10 Modality: individual  Related Interventions Assist the client in developing skills that increase the likelihood of deriving pleasure from behavioral activation (e.g., assertiveness skills, developing an exercise plan, less internal/more external focus, increased social involvement); reinforce success. Objective Identify and replace thoughts and beliefs that support depression. Target Date: 2022-02-22 Frequency: Weekly  Progress: 20 Modality: individual  Related Interventions Facilitate and reinforce the client's shift from biased depressive self-talk and beliefs to reality-based cognitive messages that enhance self-confidence and increase adaptive actions (see "Positive Self-Talk" in the Adult Psychotherapy Homework Planner by Stephannie Li). Objective Verbalize an accurate understanding of  depression. Target Date: 2022-02-22 Frequency: Weekly  Progress: 50 Modality: individual  Related Interventions Consistent with the treatment model, discuss how cognitive, behavioral, interpersonal, and/or other factors (e.g., family history) contribute to depression. 4. Develop healthy thinking patterns and beliefs about self, others, and the world that lead to the alleviation and help prevent the relapse of depression. 5. Learn and implement coping skills that result in a reduction of anxiety and worry, and improved daily functioning. Objective Learn and implement relapse prevention strategies for managing possible future anxiety symptoms. Target Date: 2022-02-22 Frequency: Weekly  Progress: 0 Modality: individual  Related Interventions Discuss with the client the distinction between a lapse and relapse, associating a lapse with an initial and reversible return of worry, anxiety symptoms, or urges to avoid, and relapse with the decision to continue the fearful and avoidant patterns. Objective Learn and implement personal and interpersonal skills to reduce anxiety and improve interpersonal relationships. Target Date: 2022-02-22 Frequency: Weekly  Progress: 30 Modality: individual  Related Interventions Use instruction, modeling, and role-playing to build the client's general social, communication, and/or conflict resolution skills. Objective Identify and engage in pleasant activities on a daily basis. Target Date: 2022-02-22 Frequency: Weekly  Progress: 20 Modality: individual  Related Interventions Engage the client in behavioral activation, increasing the client's contact with sources of reward, identifying processes that inhibit activation, and teaching skills to solve life problems (or assign "Identify and Schedule Pleasant Activities" in the Adult Psychotherapy Homework Planner by Jongsma); use behavioral techniques such as instruction, rehearsal, role-playing, role reversal as needed  to assist adoption into the client's daily life; reinforce success. Objective Learn and implement problem-solving strategies for realistically addressing worries. Target Date: 2022-02-22 Frequency: Weekly  Progress: 30 Modality: individual  Related Interventions Teach the client problem-solving strategies involving specifically defining a  problem, generating options for addressing it, evaluating the pros and cons of each option, selecting and implementing an optional action, and reevaluating and refining the action (or assign "Applying Problem-Solving to Interpersonal Conflict" in the Adult Psychotherapy Homework Planner by Stephannie Li). Objective Identify, challenge, and replace biased, fearful self-talk with positive, realistic, and empowering self-talk. Target Date: 2022-02-22 Frequency: Weekly  Progress: 10 Modality: individual  Related Interventions Explore the client's schema and self-talk that mediate his/her fear response; assist him/her in challenging the biases; replace the distorted messages with reality-based alternatives and positive, realistic self-talk that will increase his/her self-confidence in coping with irrational fears (see Cognitive Therapy of Anxiety Disorders by Laurence Slate). Objective Verbalize an understanding of the role that cognitive biases play in excessive irrational worry and persistent anxiety symptoms. Target Date: 2022-02-22 Frequency: Weekly  Progress: 0 Modality: individual  Related Interventions Assist the client in analyzing his/her worries by examining potential biases such as the probability of the negative expectation occurring, the real consequences of it occurring, his/her ability to control the outcome, the worst possible outcome, and his/her ability to accept it (see "Analyze the Probability of a Feared Event" in the Adult Psychotherapy Homework Planner by Stephannie Li; Cognitive Therapy of Anxiety Disorders by Laurence Slate). Objective Learn and  implement a strategy to limit the association between various environmental settings and worry, delaying the worry until a designated "worry time." Target Date: 2022-02-22 Frequency: Weekly  Progress: 20 Modality: individual  Related Interventions Teach the client how to recognize, stop, and postpone worry to the agreed upon worry time using skills such as thought stopping, relaxation, and redirecting attention (or assign "Making Use of the Thought-Stopping Technique" and/or "Worry Time" in the Adult Psychotherapy Homework Planner by Jongsma to assist skill development); encourage use in daily life; review and reinforce success while providing corrective feedback toward improvement. Explain the rationale for using a worry time as well as how it is to be used; agree upon and implement a worry time with the client. Objective Learn and implement calming skills to reduce overall anxiety and manage anxiety symptoms. Target Date: 2022-02-22 Frequency: Weekly  Progress: 40 Modality: individual  Related Interventions Teach the client calming/relaxation skills (e.g., applied relaxation, progressive muscle relaxation, cue-controlled relaxation; mindful breathing; biofeedback) and how to discriminate better between relaxation and tension; teach the client how to apply these skills to his/her daily life (e.g., New Directions in Progressive Muscle Relaxation by Marcelyn Ditty, and Hazlett-Stevens; Treating Generalized Anxiety Disorder by Rygh and Ida Rogue). Objective Verbalize an understanding of the cognitive, physiological, and behavioral components of anxiety and its treatment. Target Date: 2022-02-22 Frequency: Weekly  Progress: 40 Modality: individual  Related Interventions Discuss how generalized anxiety typically involves excessive worry about unrealistic threats, various bodily expressions of tension, overarousal, and hypervigilance, and avoidance of what is threatening that interact to maintain  the problem (see Mastery of Your Anxiety and Worry: Therapist Guide by Renard Matter, and Barlow; Treating Generalized Anxiety Disorder by Rygh and Ida Rogue). 6. Recognize, accept, and cope with feelings of depression. 7. Reduce overall frequency, intensity, and duration of the anxiety so that daily functioning is not impaired.  Diagnosis:Major depressive disorder, recurrent episode, moderate (HCC)  Adjustment disorder with anxiety  Plan:  -meet again on Monday, May 23, 2021 at 12pm   [place], [facility]

## 2021-05-23 ENCOUNTER — Encounter: Payer: Self-pay | Admitting: Professional

## 2021-05-23 ENCOUNTER — Ambulatory Visit (INDEPENDENT_AMBULATORY_CARE_PROVIDER_SITE_OTHER): Payer: 59 | Admitting: Professional

## 2021-05-23 DIAGNOSIS — F331 Major depressive disorder, recurrent, moderate: Secondary | ICD-10-CM

## 2021-05-23 DIAGNOSIS — F4322 Adjustment disorder with anxiety: Secondary | ICD-10-CM | POA: Diagnosis not present
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
## 2021-05-30 ENCOUNTER — Other Ambulatory Visit (HOSPITAL_BASED_OUTPATIENT_CLINIC_OR_DEPARTMENT_OTHER): Payer: Self-pay

## 2021-05-30 ENCOUNTER — Ambulatory Visit: Payer: 59 | Admitting: Professional

## 2021-06-01 ENCOUNTER — Other Ambulatory Visit (HOSPITAL_BASED_OUTPATIENT_CLINIC_OR_DEPARTMENT_OTHER): Payer: Self-pay

## 2021-06-01 MED ORDER — INSULIN PEN NEEDLE 32G X 6 MM MISC
2 refills | Status: DC
  Filled 2021-06-01: qty 100, 90d supply, fill #0
  Filled 2021-10-17: qty 100, 90d supply, fill #1
  Filled 2022-03-01: qty 100, 90d supply, fill #2

## 2021-06-06 ENCOUNTER — Encounter: Payer: Self-pay | Admitting: Professional

## 2021-06-06 ENCOUNTER — Ambulatory Visit (INDEPENDENT_AMBULATORY_CARE_PROVIDER_SITE_OTHER): Payer: 59 | Admitting: Professional

## 2021-06-06 DIAGNOSIS — F331 Major depressive disorder, recurrent, moderate: Secondary | ICD-10-CM | POA: Diagnosis not present

## 2021-06-06 DIAGNOSIS — F4322 Adjustment disorder with anxiety: Secondary | ICD-10-CM

## 2021-06-06 NOTE — Progress Notes (Signed)
[facility] Counselor/Therapist Progress Note  Patient ID: Kathleen Phillips, MRN: [id number],    Date: 06/06/2021  Time Spent: 56 minutes 03-1255 pm  Treatment Type: Individual Therapy  Reported Symptoms: irritable  Mental Status Exam: Appearance:  Well Groomed     Behavior: Cooperative, engaged  Motor: Normal  Speech/Language:  Clear and Coherent and Normal Rate  Affect: Full Range  Mood: Irritated  Thought process: normal  Thought content:   WNL  Sensory/Perceptual disturbances:   WNL  Orientation: oriented to person, place, time/date, and situation  Attention: Good  Concentration: Good  Memory: WNL  Fund of knowledge:  Good  Insight:   Good  Judgment:  Good  Impulse Control: Good   Risk Assessment: Danger to Self:  No Self-injurious Behavior: No Danger to Others: No Duty to Warn:no Physical Aggression / Violence:No  Access to Firearms a concern: No  Gang Involvement:No   Subjective: This session was held via video teletherapy due to the coronavirus risk at this time. The patient consented to video teletherapy and was located at her home during this session. She is aware it is the responsibility of the patient to secure confidentiality on her end of the session. The provider was in a private home office for the duration of this session.   The patient arrived on time for her session seated in her car.  Issues addressed: 1-homework -begin reading Mother Hunger -has been helpful   -plans to get daughter copy 2-marital -has been seeing church staff for marital counseling -plans to tell them she no longer wants to spend the money   -he is not changing   -she does not plan to end the marriage and states she has been doing this for ten years   -he no longer challenges her and isolates him -pt plans to use the money to continue her discipleship classes on Tuesdays instead of therapy 3-relationship with daughter and stepdaughter -has began reading Mother  Hunger and thinks it will be helpful to get her daughter a copy -pt only makes contact with bio daughter one time per month so she does not bother her   -have you ever discussed with daughter     -pt has not and it did not occur to her to ask her daughter about the relationship she desires with her mom -she does not reach out to stepdaughter so that she does not interfere in her relationship with her parents   - have you ever discussed with stepdaughter     -pt has not and it did not occur to her to ask her stepdaughter about the relationship she desires with her -pt misses the perfectly imperfect relationship she had with her mother  Problems Addressed  Anxiety, Grief / Loss Unresolved, Unipolar Depression Goals 1. Alleviate depressive symptoms and return to previous level of effective functioning. 2. Begin a healthy grieving process around the loss. Objective Reengage in activities with family, friends, coworkers, and others. Target Date: 2022-02-22 Frequency: Weekly  Progress: 30 Modality: individual  Related Interventions Assist the client in recommitting and reengaging in the primary social positive roles in which he/she has functioned prior to the loss. Objective Begin verbalizing feelings associated with the loss. Target Date: 2022-02-22 Frequency: Weekly  Progress: 10 Modality: individual  Related Interventions Assist the client in identifying and expressing feelings connected with his/her loss. 3. Develop healthy interpersonal relationships that lead to the alleviation and help prevent the relapse of depression. Objective Increasingly verbalize hopeful and positive statements regarding self,  others, and the future. Target Date: 2022-02-22 Frequency: Weekly  Progress: 10 Modality: individual  Related Interventions Teach the client more about depression and how to recognize and accept some sadness as a normal variation in feeling. Objective Learn and implement relapse  prevention skills. Target Date: 2022-02-22 Frequency: Weekly  Progress: 0 Modality: individual  Related Interventions Discuss with the client the distinction between a lapse and relapse, associating a lapse with a rather common, temporary setback that may involve, for example, re-experiencing a depressive thought and/or urge to withdraw or avoid (perhaps as related to some loss or conflict) and a relapse as a sustained return to a pattern of depressive thinking and feeling usually accompanied by interpersonal withdrawal and/or avoidance. Objective Implement mindfulness techniques for relapse prevention. Target Date: 2022-02-22 Frequency: Weekly  Progress: 0 Modality: individual  Related Interventions Use mindfulness meditation and cognitive therapy techniques to help the client learn to recognize and regulate the negative thought processes associated with depression and to change his/her relationship with these thoughts (see Mindfulness-Based Cognitive Therapy for Depression by Walden Field, and Clyde Canterbury). Objective Learn and implement behavioral strategies to overcome depression. Target Date: 2022-02-22 Frequency: Weekly  Progress: 10 Modality: individual  Related Interventions Assist the client in developing skills that increase the likelihood of deriving pleasure from behavioral activation (e.g., assertiveness skills, developing an exercise plan, less internal/more external focus, increased social involvement); reinforce success. Objective Identify and replace thoughts and beliefs that support depression. Target Date: 2022-02-22 Frequency: Weekly  Progress: 20 Modality: individual  Related Interventions Facilitate and reinforce the client's shift from biased depressive self-talk and beliefs to reality-based cognitive messages that enhance self-confidence and increase adaptive actions (see "Positive Self-Talk" in the Adult Psychotherapy Homework Planner by Bryn Gulling). Objective Verbalize  an accurate understanding of depression. Target Date: 2022-02-22 Frequency: Weekly  Progress: 50 Modality: individual  Related Interventions Consistent with the treatment model, discuss how cognitive, behavioral, interpersonal, and/or other factors (e.g., family history) contribute to depression. 4. Develop healthy thinking patterns and beliefs about self, others, and the world that lead to the alleviation and help prevent the relapse of depression. 5. Learn and implement coping skills that result in a reduction of anxiety and worry, and improved daily functioning. Objective Learn and implement relapse prevention strategies for managing possible future anxiety symptoms. Target Date: 2022-02-22 Frequency: Weekly  Progress: 0 Modality: individual  Related Interventions Discuss with the client the distinction between a lapse and relapse, associating a lapse with an initial and reversible return of worry, anxiety symptoms, or urges to avoid, and relapse with the decision to continue the fearful and avoidant patterns. Objective Learn and implement personal and interpersonal skills to reduce anxiety and improve interpersonal relationships. Target Date: 2022-02-22 Frequency: Weekly  Progress: 30 Modality: individual  Related Interventions Use instruction, modeling, and role-playing to build the client's general social, communication, and/or conflict resolution skills. Objective Identify and engage in pleasant activities on a daily basis. Target Date: 2022-02-22 Frequency: Weekly  Progress: 20 Modality: individual  Related Interventions Engage the client in behavioral activation, increasing the client's contact with sources of reward, identifying processes that inhibit activation, and teaching skills to solve life problems (or assign "Identify and Schedule Pleasant Activities" in the Adult Psychotherapy Homework Planner by Jongsma); use behavioral techniques such as instruction, rehearsal,  role-playing, role reversal as needed to assist adoption into the client's daily life; reinforce success. Objective Learn and implement problem-solving strategies for realistically addressing worries. Target Date: 2022-02-22 Frequency: Weekly  Progress: 30 Modality: individual  Related  Interventions Teach the client problem-solving strategies involving specifically defining a problem, generating options for addressing it, evaluating the pros and cons of each option, selecting and implementing an optional action, and reevaluating and refining the action (or assign "Applying Problem-Solving to Interpersonal Conflict" in the Adult Psychotherapy Homework Planner by Bryn Gulling). Objective Identify, challenge, and replace biased, fearful self-talk with positive, realistic, and empowering self-talk. Target Date: 2022-02-22 Frequency: Weekly  Progress: 10 Modality: individual  Related Interventions Explore the client's schema and self-talk that mediate his/her fear response; assist him/her in challenging the biases; replace the distorted messages with reality-based alternatives and positive, realistic self-talk that will increase his/her self-confidence in coping with irrational fears (see Cognitive Therapy of Anxiety Disorders by Alison Stalling). Objective Verbalize an understanding of the role that cognitive biases play in excessive irrational worry and persistent anxiety symptoms. Target Date: 2022-02-22 Frequency: Weekly  Progress: 0 Modality: individual  Related Interventions Assist the client in analyzing his/her worries by examining potential biases such as the probability of the negative expectation occurring, the real consequences of it occurring, his/her ability to control the outcome, the worst possible outcome, and his/her ability to accept it (see "Analyze the Probability of a Feared Event" in the Adult Psychotherapy Homework Planner by Bryn Gulling; Cognitive Therapy of Anxiety Disorders by Alison Stalling). Objective Learn and implement a strategy to limit the association between various environmental settings and worry, delaying the worry until a designated "worry time." Target Date: 2022-02-22 Frequency: Weekly  Progress: 20 Modality: individual  Related Interventions Teach the client how to recognize, stop, and postpone worry to the agreed upon worry time using skills such as thought stopping, relaxation, and redirecting attention (or assign "Making Use of the Thought-Stopping Technique" and/or "Worry Time" in the Adult Psychotherapy Homework Planner by Jongsma to assist skill development); encourage use in daily life; review and reinforce success while providing corrective feedback toward improvement. Explain the rationale for using a worry time as well as how it is to be used; agree upon and implement a worry time with the client. Objective Learn and implement calming skills to reduce overall anxiety and manage anxiety symptoms. Target Date: 2022-02-22 Frequency: Weekly  Progress: 40 Modality: individual  Related Interventions Teach the client calming/relaxation skills (e.g., applied relaxation, progressive muscle relaxation, cue-controlled relaxation; mindful breathing; biofeedback) and how to discriminate better between relaxation and tension; teach the client how to apply these skills to his/her daily life (e.g., New Directions in Progressive Muscle Relaxation by Casper Harrison, and Hazlett-Stevens; Treating Generalized Anxiety Disorder by Rygh and Amparo Bristol). Objective Verbalize an understanding of the cognitive, physiological, and behavioral components of anxiety and its treatment. Target Date: 2022-02-22 Frequency: Weekly  Progress: 40 Modality: individual  Related Interventions Discuss how generalized anxiety typically involves excessive worry about unrealistic threats, various bodily expressions of tension, overarousal, and hypervigilance, and avoidance of what is threatening  that interact to maintain the problem (see Mastery of Your Anxiety and Worry: Therapist Guide by Corky Mull, and Barlow; Treating Generalized Anxiety Disorder by Rygh and Amparo Bristol). 6. Recognize, accept, and cope with feelings of depression. 7. Reduce overall frequency, intensity, and duration of the anxiety so that daily functioning is not impaired.  Diagnosis:Major depressive disorder, recurrent episode, moderate ([place])  Adjustment disorder with anxiety  Plan:  -move to bi-weekly appoitments -meet again on Monday, June 20, 2021 at 12pm   [place], [facility]

## 2021-06-13 ENCOUNTER — Ambulatory Visit: Payer: 59 | Admitting: Professional

## 2021-06-20 ENCOUNTER — Other Ambulatory Visit (HOSPITAL_BASED_OUTPATIENT_CLINIC_OR_DEPARTMENT_OTHER): Payer: Self-pay

## 2021-06-20 ENCOUNTER — Encounter: Payer: Self-pay | Admitting: Professional

## 2021-06-20 ENCOUNTER — Ambulatory Visit (INDEPENDENT_AMBULATORY_CARE_PROVIDER_SITE_OTHER): Payer: 59 | Admitting: Professional

## 2021-06-20 DIAGNOSIS — F331 Major depressive disorder, recurrent, moderate: Secondary | ICD-10-CM | POA: Diagnosis not present

## 2021-06-20 DIAGNOSIS — F4322 Adjustment disorder with anxiety: Secondary | ICD-10-CM | POA: Diagnosis not present

## 2021-06-20 NOTE — Progress Notes (Signed)
[facility] Counselor/Therapist Progress Note ? ?Patient ID: Kathleen Phillips, MRN: [id number],   ? ?Date: 06/20/2021 ? ?Time Spent: 50 minutes 1202-1252 pm ? ?Treatment Type: Individual Therapy ? ?Risk Assessment: ?Danger to Self:  No ?Self-injurious Behavior: No ?Danger to Others: No ? ?Subjective: This session was held via video teletherapy due to the coronavirus risk at this time. The patient consented to video teletherapy and was located at her home during this session. She is aware it is the responsibility of the patient to secure confidentiality on her end of the session. The provider was in a private home office for the duration of this session.  ? ?The patient arrived late for her session seated in her car. ? ?Issues addressed: ?1-homework ?-continued reading Mother Hunger ?-did a lot of reading and relaxing on her spring break ?  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  -worried it will not be an easy class ?  -she has to do a self-portrait ?  -she has to do a collage ?-she admits that she tends to over think ?  -she can not see depth due to her vision ?-her instructor introduced himself in broken english ?  -she is thankful that it is online ?-she is not happy about the essays and the research papers ?3-daughter Kathleen Phillips ?-called her mother to ask advice about a job and to rehearse ?-pt notices her daughter has been reaching out more often since signed over the car to her ?  -she thinks her willingness to treat her daughter as an adult has caused their relationship to grow ?  -she is excited to see her doing things on her own ?  -they met for her birthday and she was not showy about her new car ?-what else would you like to see in relationship ?  -a few months ago she did not want to talk to or be around her mother ?    -pt feels "glad" that her daughter has come around ?  -pt thinks giving her space has been helpful ?4-relationship with brother  Kathleen Phillips ?-brother has considered her delicate, however, has never seen her treated for her bipolar disorder ?-he has not had contact with pt ?-her brother always goes back to the past behaviors ?-pt has decided to do what she needs to do to lay her head down at night ?  -she still sends cards and celebrates their birthdays and holiday "with them" ?-they have to deal with that they choose to alienate ?  -her brother and sister in law ?  -their children, nephew Kathleen Phillips 65 and niece Kathleen Phillips 52 (she was 62 when her father limited contact) ?  -they are not allowed to have my number ?  -the niece called her in the fall to park at her house for [name] ?    -she has never reached out to the pt ?    -the pt said she could park at her home ?  -her niece replied to a text wishing her a happy birthday ?-when her mother died the patient struggled with her [name] ?5-pt admits she has work to do when it comes to her brothers Kathleen Phillips and Kathleen Phillips) ?-they have alienated her ?-wants to feel okay going through this life alone when she has family ?-she texted Kathleen Phillips last week for his address to send his birthday card for next week ?  -he has a new house but provided the address ?-pt has never met [name]  kindergarten aged daughter but sent her a birthday card in February ?  -he texted her and let her read it and used it as part of her reading assignment for school ?  -she does not know who the patient is ?-she had another brother that passed away (also Kathleen Phillips's full blood) ?  -he struggled with alcoholism and bipolar disorder ?-she was not in Kathleen Phillips's life until their father passed away when she was 67 ?  -they knew they existed but their was no relationship ?  -she has tried "to exert myself and say you have a sister and it didn't go well" ?-Kathleen Phillips will reach out to her at times ?  -pt doesn't know if he wants to pursue as much as she does ?  -his mom is still living and it is difficult for his mom to see her ?    -our father was in an  extramarital affair that yielded the pt ?  -pt has met his mother and she has always been very nice ?-pt never got to know her dad ?  -she is reticent to get into the family even though they want a relationship ?  -she is friends with them on facebook ?-reviewed Attachment Styles with pt ?  -pt thinks this might be a significant discovery to focus on in therapy ? ?Problems Addressed  ?Anxiety, Grief / Loss Unresolved, Unipolar Depression ?Goals ?1. Alleviate depressive symptoms and return to previous level of effective functioning. ?2. Begin a healthy grieving process around the loss. ?Objective ?Reengage in activities with family, friends, coworkers, and others. ?Target Date: 2022-02-22 Frequency: Weekly  ?Progress: 30 Modality: individual  ?Related Interventions ?Assist the client in recommitting and reengaging in the primary social positive roles in which he/she has functioned prior to the loss. ?Objective ?Begin verbalizing feelings associated with the loss. ?Target Date: 2022-02-22 Frequency: Weekly  ?Progress: 10 Modality: individual  ?Related Interventions ?Assist the client in identifying and expressing feelings connected with his/her loss. ?3. Develop healthy interpersonal relationships that lead to the alleviation and help prevent the relapse of depression. ?Objective ?Increasingly verbalize hopeful and positive statements regarding self, others, and the future. ?Target Date: 2022-02-22 Frequency: Weekly  ?Progress: 10 Modality: individual  ?Related Interventions ?Teach the client more about depression and how to recognize and accept some sadness as a normal variation in feeling. ?Objective ?Learn and implement relapse prevention skills. ?Target Date: 2022-02-22 Frequency: Weekly  ?Progress: 0 Modality: individual  ?Related Interventions ?Discuss with the client the distinction between a lapse and relapse, associating a lapse with a rather common, temporary setback that may involve, for example,  re-experiencing a depressive thought and/or urge to withdraw or avoid (perhaps as related to some loss or conflict) and a relapse as a sustained return to a pattern of depressive thinking and feeling usually accompanied by interpersonal withdrawal and/or avoidance. ?Objective ?Implement mindfulness techniques for relapse prevention. ?Target Date: 2022-02-22 Frequency: Weekly  ?Progress: 0 Modality: individual  ?Related Interventions ?Use mindfulness meditation and cognitive therapy techniques to help the client learn to recognize and regulate the negative thought processes associated with depression and to change his/her relationship with these thoughts (see Mindfulness-Based Cognitive Therapy for Depression by Walden Field, and Clyde Canterbury). ?Objective ?Learn and implement behavioral strategies to overcome depression. ?Target Date: 2022-02-22 Frequency: Weekly  ?Progress: 10 Modality: individual  ?Related Interventions ?Assist the client in developing skills that increase the likelihood of deriving pleasure from behavioral activation (e.g., assertiveness skills, developing an exercise plan, less internal/more external focus,  increased social involvement); reinforce success. ?Objective ?Identify and replace thoughts and beliefs that support depression. ?Target Date: 2022-02-22 Frequency: Weekly  ?Progress: 20 Modality: individual  ?Related Interventions ?Facilitate and reinforce the client's shift from biased depressive self-talk and beliefs to reality-based cognitive messages that enhance self-confidence and increase adaptive actions (see "Positive Self-Talk" in the Adult Psychotherapy Homework Planner by Bryn Gulling). ?Objective ?Verbalize an accurate understanding of depression. ?Target Date: 2022-02-22 Frequency: Weekly  ?Progress: 50 Modality: individual  ?Related Interventions ?Consistent with the treatment model, discuss how cognitive, behavioral, interpersonal, and/or other factors (e.g., family history)  contribute to depression. ?4. Develop healthy thinking patterns and beliefs about self, others, and the world that lead to the alleviation and help prevent the relapse of depression. ?5. Learn and implement coping skills that resul

## 2021-06-21 ENCOUNTER — Other Ambulatory Visit (HOSPITAL_BASED_OUTPATIENT_CLINIC_OR_DEPARTMENT_OTHER): Payer: Self-pay

## 2021-06-27 ENCOUNTER — Ambulatory Visit: Payer: 59 | Admitting: Professional

## 2021-07-04 ENCOUNTER — Ambulatory Visit (INDEPENDENT_AMBULATORY_CARE_PROVIDER_SITE_OTHER): Payer: 59 | Admitting: Professional

## 2021-07-04 ENCOUNTER — Encounter: Payer: Self-pay | Admitting: Professional

## 2021-07-04 DIAGNOSIS — F4322 Adjustment disorder with anxiety: Secondary | ICD-10-CM | POA: Diagnosis not present

## 2021-07-04 DIAGNOSIS — F331 Major depressive disorder, recurrent, moderate: Secondary | ICD-10-CM

## 2021-07-04 NOTE — Addendum Note (Signed)
Addended by: Ardyth Man on: 07/04/2021 12:50 PM ? ? Modules accepted: Level of Service ? ?

## 2021-07-04 NOTE — Progress Notes (Addendum)
[facility] Counselor/Therapist Progress Note ? ?Patient ID: Kathleen Phillips, MRN: [id number],   ? ?Date: 07/04/2021 ? ?Time Spent: 40 minutes 1204-1244 pm ? ?Treatment Type: Individual Therapy ? ?Risk Assessment: ?Danger to Self:  No ?Self-injurious Behavior: No ?Danger to Others: No ? ?Subjective: This session was held via video teletherapy due to the coronavirus risk at this time. The patient consented to video teletherapy and was located at her home during this session. She is aware it is the responsibility of the patient to secure confidentiality on her end of the session. The provider was in a private home office for the duration of this session.  ? ?The patient arrived late for her session seated in her car. ? ?Issues addressed: ?1-homework-partial ?-continued reading [employer] ?-read attachment styles and come prepared to discuss ?  -did not do but thinks it will be helpful ?2-social ?-has decided to start he [place] back ?  -wants female companionship ?  -bring money into the home ?  -husband will be angry because it takes time from him ?-was invited to a bible study by Luvenia Starch at work ?  -her neighbor goes to same women's group ?3-relationship with daughter ?-in reading [employer], pt has learned that it is because of her mother that she was not a good mom ?  -does not want to shame herself anymore ?-sees opportunities now to be there for her since she is learning what to do ?  -sent message to her daughter last night about her new job ?  -available for advice as she requests and she has started requesting ?-thinks her daughter has learned that when she reaches out that the pt will be there ?4-improved self-esteem ?-can see self as not needing a man but wanting one to compliment her life ?  -she is with [name] because she wants to be not because she needs him ?  -has learned that she can do things on her own since [place] cannot do most things ?5-marital ?-she sees her role as  [name]'s caregiver shifting from a wife to FT caregiver ?  -[name] talks to her primarily about things related to his care ?  -there is no intimacy ?    -it is okay with the pt ?  -he sleeps in the other room ?-he pays the bills and mortgage ?-challenged thought "he puts a lot on me" ?  -"yeah, I allow it" ?  -example: he cleaned his desk and had a lot of trash to be taken to kitchen ?    -he came to kitchen and did not bring to kitchen ?    -she went and got the trash from his desk ? ? ?Problems Addressed  ?Anxiety, Grief / Loss Unresolved, Unipolar Depression ?Goals ?1. Alleviate depressive symptoms and return to previous level of effective functioning. ?2. Begin a healthy grieving process around the loss. ?Objective ?Reengage in activities with family, friends, coworkers, and others. ?Target Date: 2022-02-22 Frequency: Weekly  ?Progress: 30 Modality: individual  ?Related Interventions ?Assist the client in recommitting and reengaging in the primary social positive roles in which he/she has functioned prior to the loss. ?Objective ?Begin verbalizing feelings associated with the loss. ?Target Date: 2022-02-22 Frequency: Weekly  ?Progress: 10 Modality: individual  ?Related Interventions ?Assist the client in identifying and expressing feelings connected with his/her loss. ?3. Develop healthy interpersonal relationships that lead to the alleviation and help prevent the relapse of depression. ?Objective ?Increasingly verbalize hopeful and positive statements regarding self,  others, and the future. ?Target Date: 2022-02-22 Frequency: Weekly  ?Progress: 10 Modality: individual  ?Related Interventions ?Teach the client more about depression and how to recognize and accept some sadness as a normal variation in feeling. ?Objective ?Learn and implement relapse prevention skills. ?Target Date: 2022-02-22 Frequency: Weekly  ?Progress: 0 Modality: individual  ?Related Interventions ?Discuss with the client the distinction  between a lapse and relapse, associating a lapse with a rather common, temporary setback that may involve, for example, re-experiencing a depressive thought and/or urge to withdraw or avoid (perhaps as related to some loss or conflict) and a relapse as a sustained return to a pattern of depressive thinking and feeling usually accompanied by interpersonal withdrawal and/or avoidance. ?Objective ?Implement mindfulness techniques for relapse prevention. ?Target Date: 2022-02-22 Frequency: Weekly  ?Progress: 0 Modality: individual  ?Related Interventions ?Use mindfulness meditation and cognitive therapy techniques to help the client learn to recognize and regulate the negative thought processes associated with depression and to change his/her relationship with these thoughts (see Mindfulness-Based Cognitive Therapy for Depression by Walden Field, and Clyde Canterbury). ?Objective ?Learn and implement behavioral strategies to overcome depression. ?Target Date: 2022-02-22 Frequency: Weekly  ?Progress: 10 Modality: individual  ?Related Interventions ?Assist the client in developing skills that increase the likelihood of deriving pleasure from behavioral activation (e.g., assertiveness skills, developing an exercise plan, less internal/more external focus, increased social involvement); reinforce success. ?Objective ?Identify and replace thoughts and beliefs that support depression. ?Target Date: 2022-02-22 Frequency: Weekly  ?Progress: 20 Modality: individual  ?Related Interventions ?Facilitate and reinforce the client's shift from biased depressive self-talk and beliefs to reality-based cognitive messages that enhance self-confidence and increase adaptive actions (see "Positive Self-Talk" in the Adult Psychotherapy Homework Planner by Bryn Gulling). ?Objective ?Verbalize an accurate understanding of depression. ?Target Date: 2022-02-22 Frequency: Weekly  ?Progress: 50 Modality: individual  ?Related Interventions ?Consistent with  the treatment model, discuss how cognitive, behavioral, interpersonal, and/or other factors (e.g., family history) contribute to depression. ?4. Develop healthy thinking patterns and beliefs about self, others, and the world that lead to the alleviation and help prevent the relapse of depression. ?5. Learn and implement coping skills that result in a reduction of anxiety and worry, and improved daily functioning. ?Objective ?Learn and implement relapse prevention strategies for managing possible future anxiety symptoms. ?Target Date: 2022-02-22 Frequency: Weekly  ?Progress: 0 Modality: individual  ?Related Interventions ?Discuss with the client the distinction between a lapse and relapse, associating a lapse with an initial and reversible return of worry, anxiety symptoms, or urges to avoid, and relapse with the decision to continue the fearful and avoidant patterns. ?Objective ?Learn and implement personal and interpersonal skills to reduce anxiety and improve interpersonal relationships. ?Target Date: 2022-02-22 Frequency: Weekly  ?Progress: 30 Modality: individual  ?Related Interventions ?Use instruction, modeling, and role-playing to build the client's general social, communication, and/or conflict resolution skills. ?Objective ?Identify and engage in pleasant activities on a daily basis. ?Target Date: 2022-02-22 Frequency: Weekly  ?Progress: 20 Modality: individual  ?Related Interventions ?Engage the client in behavioral activation, increasing the client's contact with sources of reward, identifying processes that inhibit activation, and teaching skills to solve life problems (or assign "Identify and Schedule Pleasant Activities" in the Adult Psychotherapy Homework Planner by Bryn Gulling); use behavioral techniques such as instruction, rehearsal, role-playing, role reversal as needed to assist adoption into the client's daily life; reinforce success. ?Objective ?Learn and implement problem-solving strategies for  realistically addressing worries. ?Target Date: 2022-02-22 Frequency: Weekly  ?Progress: 30 Modality: individual  ?Related  Interventions ?Teach the client problem-solving strategies involving specifically defining

## 2021-07-11 ENCOUNTER — Ambulatory Visit: Payer: 59 | Admitting: Professional

## 2021-07-18 ENCOUNTER — Encounter: Payer: Self-pay | Admitting: Professional

## 2021-07-18 ENCOUNTER — Ambulatory Visit (INDEPENDENT_AMBULATORY_CARE_PROVIDER_SITE_OTHER): Payer: 59 | Admitting: Professional

## 2021-07-18 DIAGNOSIS — F4322 Adjustment disorder with anxiety: Secondary | ICD-10-CM | POA: Diagnosis not present

## 2021-07-18 DIAGNOSIS — F331 Major depressive disorder, recurrent, moderate: Secondary | ICD-10-CM | POA: Diagnosis not present

## 2021-07-18 NOTE — Progress Notes (Signed)
[facility] Counselor/Therapist Progress Note ? ?Patient ID: Kathleen Phillips, MRN: [id number],   ? ?Date: 07/18/2021 ? ?Time Spent: 49 minutes 1204-1253 pm ? ?Treatment Type: Individual Therapy ? ?Risk Assessment: ?Danger to Self:  No ?Self-injurious Behavior: No ?Danger to Others: No ? ?Subjective: This session was held via video teletherapy due to the coronavirus risk at this time. The patient consented to video teletherapy and was located in her car during this session. She is aware it is the responsibility of the patient to secure confidentiality on her end of the session. The provider was in a private home office for the duration of this session.  ? ?The patient arrived late for her session seated in her car. ? ?Issues addressed: ?1-homework-completed ?-work on attachment styles homework ?2-attachment styles ?-endorses anxious-avoidant style ?-sees herself trying to develop secure attachment ?  -does not see as possible with his cognitive impairment ?  -talked with lady at women's activity who has a spouse in recovery ?    -pt felt encouraged by this woman's testimony ?3-social ?-has started her [place] back ?  -husband aware and supports (if not having too much product) ?  -she has had several ladies reach out to her for product ?4-marital ?-husband has increased cognitive impairment ?  -his medical team is not listening to her ?-pt reports if people don't see him on a daily basis they would not understand ?  -he ordered groceries three times on Saturday ?-her spouse has began having seizures ?-his friends have started realizing that his memory is impaired ?  -they have been contacting her to express their concerns ?-relationship ?  -little to no intimacy ?  -very little conversation ?-pt wants to sell house to move in to something that is single story to better meet his needs ?  -he is planing on a bathroom remodel ?  -she will not sign for it or agree to it ?-several weeks ago he  wanted to refinance and she refused to sign off ?  -he was very angry that she would not agree ? ?Problems Addressed  ?Anxiety, Grief / Loss Unresolved, Unipolar Depression ?Goals ?1. Alleviate depressive symptoms and return to previous level of effective functioning. ?2. Begin a healthy grieving process around the loss. ?Objective ?Reengage in activities with family, friends, coworkers, and others. ?Target Date: 2022-02-22 Frequency: Weekly  ?Progress: 30 Modality: individual  ?Related Interventions ?Assist the client in recommitting and reengaging in the primary social positive roles in which he/she has functioned prior to the loss. ?Objective ?Begin verbalizing feelings associated with the loss. ?Target Date: 2022-02-22 Frequency: Weekly  ?Progress: 10 Modality: individual  ?Related Interventions ?Assist the client in identifying and expressing feelings connected with his/her loss. ?3. Develop healthy interpersonal relationships that lead to the alleviation and help prevent the relapse of depression. ?Objective ?Increasingly verbalize hopeful and positive statements regarding self, others, and the future. ?Target Date: 2022-02-22 Frequency: Weekly  ?Progress: 10 Modality: individual  ?Related Interventions ?Teach the client more about depression and how to recognize and accept some sadness as a normal variation in feeling. ?Objective ?Learn and implement relapse prevention skills. ?Target Date: 2022-02-22 Frequency: Weekly  ?Progress: 0 Modality: individual  ?Related Interventions ?Discuss with the client the distinction between a lapse and relapse, associating a lapse with a rather common, temporary setback that may involve, for example, re-experiencing a depressive thought and/or urge to withdraw or avoid (perhaps as related to some loss or conflict) and a relapse as a sustained  return to a pattern of depressive thinking and feeling usually accompanied by interpersonal withdrawal and/or  avoidance. ?Objective ?Implement mindfulness techniques for relapse prevention. ?Target Date: 2022-02-22 Frequency: Weekly  ?Progress: 0 Modality: individual  ?Related Interventions ?Use mindfulness meditation and cognitive therapy techniques to help the client learn to recognize and regulate the negative thought processes associated with depression and to change his/her relationship with these thoughts (see Mindfulness-Based Cognitive Therapy for Depression by Walden Field, and Clyde Canterbury). ?Objective ?Learn and implement behavioral strategies to overcome depression. ?Target Date: 2022-02-22 Frequency: Weekly  ?Progress: 10 Modality: individual  ?Related Interventions ?Assist the client in developing skills that increase the likelihood of deriving pleasure from behavioral activation (e.g., assertiveness skills, developing an exercise plan, less internal/more external focus, increased social involvement); reinforce success. ?Objective ?Identify and replace thoughts and beliefs that support depression. ?Target Date: 2022-02-22 Frequency: Weekly  ?Progress: 20 Modality: individual  ?Related Interventions ?Facilitate and reinforce the client's shift from biased depressive self-talk and beliefs to reality-based cognitive messages that enhance self-confidence and increase adaptive actions (see "Positive Self-Talk" in the Adult Psychotherapy Homework Planner by Bryn Gulling). ?Objective ?Verbalize an accurate understanding of depression. ?Target Date: 2022-02-22 Frequency: Weekly  ?Progress: 50 Modality: individual  ?Related Interventions ?Consistent with the treatment model, discuss how cognitive, behavioral, interpersonal, and/or other factors (e.g., family history) contribute to depression. ?4. Develop healthy thinking patterns and beliefs about self, others, and the world that lead to the alleviation and help prevent the relapse of depression. ?5. Learn and implement coping skills that result in a reduction of anxiety and  worry, and improved daily functioning. ?Objective ?Learn and implement relapse prevention strategies for managing possible future anxiety symptoms. ?Target Date: 2022-02-22 Frequency: Weekly  ?Progress: 0 Modality: individual  ?Related Interventions ?Discuss with the client the distinction between a lapse and relapse, associating a lapse with an initial and reversible return of worry, anxiety symptoms, or urges to avoid, and relapse with the decision to continue the fearful and avoidant patterns. ?Objective ?Learn and implement personal and interpersonal skills to reduce anxiety and improve interpersonal relationships. ?Target Date: 2022-02-22 Frequency: Weekly  ?Progress: 30 Modality: individual  ?Related Interventions ?Use instruction, modeling, and role-playing to build the client's general social, communication, and/or conflict resolution skills. ?Objective ?Identify and engage in pleasant activities on a daily basis. ?Target Date: 2022-02-22 Frequency: Weekly  ?Progress: 20 Modality: individual  ?Related Interventions ?Engage the client in behavioral activation, increasing the client's contact with sources of reward, identifying processes that inhibit activation, and teaching skills to solve life problems (or assign "Identify and Schedule Pleasant Activities" in the Adult Psychotherapy Homework Planner by Bryn Gulling); use behavioral techniques such as instruction, rehearsal, role-playing, role reversal as needed to assist adoption into the client's daily life; reinforce success. ?Objective ?Learn and implement problem-solving strategies for realistically addressing worries. ?Target Date: 2022-02-22 Frequency: Weekly  ?Progress: 30 Modality: individual  ?Related Interventions ?Teach the client problem-solving strategies involving specifically defining a problem, generating options for addressing it, evaluating the pros and cons of each option, selecting and implementing an optional action, and reevaluating and  refining the action (or assign "Applying Problem-Solving to Interpersonal Conflict" in the Adult Psychotherapy Homework Planner by Bryn Gulling). ?Objective ?Identify, challenge, and replace biased, fearful self-talk with positive, rea

## 2021-07-19 ENCOUNTER — Other Ambulatory Visit: Payer: Self-pay | Admitting: Physician Assistant

## 2021-07-19 ENCOUNTER — Other Ambulatory Visit (HOSPITAL_BASED_OUTPATIENT_CLINIC_OR_DEPARTMENT_OTHER): Payer: Self-pay

## 2021-07-19 MED ORDER — MELOXICAM 15 MG PO TABS
15.0000 mg | ORAL_TABLET | Freq: Every day | ORAL | 1 refills | Status: DC
  Filled 2021-07-19: qty 90, 90d supply, fill #0
  Filled 2021-10-17 – 2021-12-16 (×2): qty 90, 90d supply, fill #1

## 2021-07-25 ENCOUNTER — Ambulatory Visit: Payer: 59 | Admitting: Professional

## 2021-08-01 ENCOUNTER — Ambulatory Visit: Payer: 59 | Admitting: Professional

## 2021-08-02 ENCOUNTER — Other Ambulatory Visit (HOSPITAL_BASED_OUTPATIENT_CLINIC_OR_DEPARTMENT_OTHER): Payer: Self-pay

## 2021-08-08 ENCOUNTER — Ambulatory Visit: Payer: 59 | Admitting: Professional

## 2021-08-15 ENCOUNTER — Other Ambulatory Visit (HOSPITAL_BASED_OUTPATIENT_CLINIC_OR_DEPARTMENT_OTHER): Payer: Self-pay

## 2021-08-15 ENCOUNTER — Encounter: Payer: Self-pay | Admitting: Professional

## 2021-08-15 ENCOUNTER — Ambulatory Visit (INDEPENDENT_AMBULATORY_CARE_PROVIDER_SITE_OTHER): Payer: 59 | Admitting: Professional

## 2021-08-15 DIAGNOSIS — F4322 Adjustment disorder with anxiety: Secondary | ICD-10-CM

## 2021-08-15 DIAGNOSIS — F331 Major depressive disorder, recurrent, moderate: Secondary | ICD-10-CM | POA: Diagnosis not present

## 2021-08-15 NOTE — Progress Notes (Signed)
[facility] Counselor/Therapist Progress Note ? ?Patient ID: Kathleen Phillips, MRN: [id number],   ? ?Date: 08/15/2021 ? ?Time Spent: 56 minutes 1202-1258 pm ? ?Treatment Type: Individual Therapy ? ?Risk Assessment: ?Danger to Self:  No ?Self-injurious Behavior: No ?Danger to Others: No ? ?Subjective: This session was held via video teletherapy. The patient consented to video teletherapy and was located in her car during this session. She is aware it is the responsibility of the patient to secure confidentiality on her end of the session. The provider was in a private home office for the duration of this session.  ? ?The patient arrived on time for her session seated in her car. ? ?Issues addressed: ?1-homework-completed ?-begin journaling concerns related to spouse to share with his medical providers. ?2-relationships ?-Mother's Day was difficult since her children did not call her ?-she received a text from her daughter ?-"I don't even rate a hallmark card, you can't even put out the effort of a card and mail it to her, I don't even get that much" ?-pt reports having spent almost $300 on her stepdaughter Dahlia Client to start her new job ?  -online she highlighted her mother and grandmother and made no mention of her ?-she had friends who acknowledged but her own kids did not ?-Acupuncturist didn't put forth any effort to want to be around her ?  -he did not even say happy Mother's Day to her ?  -he put the dogs in the room with her so he could have quiet time for himself ?-she is going to do church camp for three days in [place] this summer ?  -family camp by herself ?-she has considered getting up early morning and going to the beach and driving home at night   ?  -Lorin Picket never wants to go anywhere or do anything but she is going to go on her own ?-she and Lorin Picket went and got pedicures ?  -he can no longer reach his feet and wanted for her to "make an appointment with her ladies" ?3-professional ?-she is trying  to do Dr. 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  -you can't do your work you need to do ?-she had 17 refills today and on a regular day she will eat her lunch while doing refills ?-she has not verbalized her concern to her manager as this is the third week ?  -she wants to see how things are going  ?  -she is trying to give it time and not immediately state she cannot do ?-she chose to take summer session off for school ?  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  -she has stayed in touch while she was on leave ?  -she was so glad to see her this morning at work ?4-measurable objective ?-reviewed with pt ?-she has shown improvement in the majority of areas ? ?Problems Addressed  ?Anxiety, Grief / Loss Unresolved, Unipolar Depression ?Goals ?1. Alleviate depressive symptoms and return to previous level of effective functioning. ?2. Begin a healthy grieving process around the loss. ?Objective ?Reengage in activities with family, friends, coworkers, and others. ?Target Date: 2022-02-22 Frequency: biweekly  ?Progress: 50 Modality: individual  ?Related Interventions ?Assist the client  in recommitting and reengaging in the primary social positive roles in which he/she has functioned prior to the loss. ?Objective ?Begin verbalizing feelings associated with the loss. ?Target Date: 2022-02-22 Frequency: biweekly  ?Progress: 10 Modality: individual  ?Related Interventions ?Assist the client in identifying and expressing feelings connected with his/her loss. ?3. Develop healthy interpersonal relationships that lead to the alleviation and help prevent the relapse of depression. ?Objective ?Increasingly verbalize hopeful and positive statements regarding  self, others, and the future. ?Target Date: 2022-02-22 Frequency: biweekly  ?Progress: 30 Modality: individual  ?Related Interventions ?Teach the client more about depression and how to recognize and accept some sadness as a normal variation in feeling. ?Objective ?Learn and implement relapse prevention skills. ?Target Date: 2022-02-22 Frequency: biweekly  ?Progress: 10 Modality: individual  ?Related Interventions ?Discuss with the client the distinction between a lapse and relapse, associating a lapse with a rather common, temporary setback that may involve, for example, re-experiencing a depressive thought and/or urge to withdraw or avoid (perhaps as related to some loss or conflict) and a relapse as a sustained return to a pattern of depressive thinking and feeling usually accompanied by interpersonal withdrawal and/or avoidance. ?Objective ?Implement mindfulness techniques for relapse prevention. ?Target Date: 2022-02-22 Frequency: biweekly  ?Progress: 10 Modality: individual  ?Related Interventions ?Use mindfulness meditation and cognitive therapy techniques to help the client learn to recognize and regulate the negative thought processes associated with depression and to change his/her relationship with these thoughts (see Mindfulness-Based Cognitive Therapy for Depression by Chevis PrettySegal, Williams, and Shona Simpsoneasdale). ?Objective ?Learn and implement behavioral strategies to overcome depression. ?Target Date: 2022-02-22 Frequency: biweekly  ?Progress: 20 Modality: individual  ?Related Interventions ?Assist the client in developing skills that increase the likelihood of deriving pleasure from behavioral activation (e.g., assertiveness skills, developing an exercise plan, less internal/more external focus, increased social involvement); reinforce success. ?Objective ?Identify and replace thoughts and beliefs that support depression. ?Target Date: 2022-02-22 Frequency: biweekly  ?Progress: 30 Modality: individual  ?Related  Interventions ?Facilitate and reinforce the client's shift from biased depressive self-talk and beliefs to reality-based cognitive messages that enhance self-confidence and increase adaptive actions (see "Positive Self-Talk" in the Adult Psychotherapy Homework Planner by Stephannie LiJongsma). ?Objective ?Verbalize an accurate understanding of depression. ?Target Date: 2022-02-22 Frequency: biweekly  ?Progress: 60 Modality: individual  ?Related Interventions ?Consistent with the treatment model, discuss how cognitive, behavioral, interpersonal, and/or other factors (e.g., family history) contribute to depression. ?4. Develop healthy thinking patterns and beliefs about self, others, and the world that lead to the alleviation and help prevent the relapse of depression. ?5. Learn and implement coping skills that result in a reduction of anxiety and worry, and improved daily functioning. ?Objective ?Learn and implement relapse prevention strategies for managing possible future anxiety symptoms. ?Target Date: 2022-02-22 Frequency: biweekly  ?Progress: 10 Modality: individual  ?Related Interventions ?Discuss with the client the distinction between a lapse and relapse, associating a lapse with an initial and reversible return of worry, anxiety symptoms, or urges to avoid, and relapse with the decision to continue the fearful and avoidant patterns. ?Objective ?Learn and implement personal and interpersonal skills to reduce anxiety and improve interpersonal relationships. ?Target Date: 2022-02-22 Frequency: biweekly  ?Progress: 50 Modality: individual  ?Related Interventions ?Use instruction, modeling, and role-playing to build the client's general social, communication, and/or conflict resolution skills. ?Objective ?Identify and engage in pleasant activities on a daily basis. ?Target Date: 2022-02-22 Frequency: biweekly  ?Progress: 40 Modality: individual  ?Related Interventions ?Engage the client in behavioral activation, increasing  the client's contact with sources of reward, identifying processes that inhibit activation, and teaching skills to solve life problems (or assign "Identify and Schedule Pleasant Activities" in the Adult

## 2021-08-22 ENCOUNTER — Ambulatory Visit: Payer: 59 | Admitting: Professional

## 2021-08-30 ENCOUNTER — Ambulatory Visit: Payer: 59 | Admitting: Professional

## 2021-09-05 ENCOUNTER — Ambulatory Visit: Payer: 59 | Admitting: Professional

## 2021-09-12 ENCOUNTER — Encounter: Payer: Self-pay | Admitting: Professional

## 2021-09-12 ENCOUNTER — Ambulatory Visit: Payer: 59 | Admitting: Professional

## 2021-09-12 ENCOUNTER — Ambulatory Visit (INDEPENDENT_AMBULATORY_CARE_PROVIDER_SITE_OTHER): Payer: 59 | Admitting: Professional

## 2021-09-12 DIAGNOSIS — F4322 Adjustment disorder with anxiety: Secondary | ICD-10-CM

## 2021-09-12 DIAGNOSIS — F331 Major depressive disorder, recurrent, moderate: Secondary | ICD-10-CM

## 2021-09-12 NOTE — Progress Notes (Signed)
[facility] Counselor/Therapist Progress Note  Patient ID: Kathleen Phillips, MRN: [id number],    Date: 09/12/2021  Time Spent: 42 minutes 1202-1244pm  Treatment Type: Individual Therapy  Risk Assessment: Danger to Self:  No Self-injurious Behavior: No Danger to Others: No  Subjective: This session was held via video teletherapy. The patient consented to video teletherapy and was located in her car during this session. She is aware it is the responsibility of the patient to secure confidentiality on her end of the session. The provider was in a private home office for the duration of this session.   The patient arrived late for her session seated in her car.  Issues addressed: 1-homework-deferred -getting out with friends more often -getting involved in yoga at [employer] 2-marital -[name] was scammed for $2100 after "[employer]" called and told him they were having a computer issue   -he clicked to allow the person who called access   -they each went to two different locations to get their own accounts changed     -pt changed her to be on the safe side   -he was locked out of his computer     -his solution to being locked out was to buy a new computer -pt is now having to watching the finances, before she did not   -he would get angry if he knew she was watching over the finances -pt reports "I just pray that he'll provide what we need" -her husband was talking to a friend on the phone   -the person died by suicide while on the phone with pt's spouse   -it ws one of her spouse's AA buddies   -he called the pt at work and told her and she said to call his counselor and it not call the pastor     -husband says he has over 200 days clean     -he has a Engineer, manufacturing (licensed) counselor through [employer] he talks to weekly -"for right now I'm where I need to be" 3-self-care -pt is getting her nails done today -keeping up with the house -attending church online/in-person  every other week -has been discipleship with marital counselor at [employer]   -one week individual for discipleship and the other week with [name] -[name] is wobbly and confused   -he forgets what he is going to say and what he has said within a minute   -he had an MRI today and he asked what year it was 3-professional -she is working to half days today and tomorrow, using FMLA to get [employer] to appointments -pt is able to stay focused and get her work done -she is able to separate home and work until he calls or texts -pt admits she tries very hard to compartmentalize between home and work -still working her [employer]   -did take a break for a few days since husband's friend's suicide 4-daughter -moving in with boyfriend -pt has never met her boyfriend -pt left conversation with telling to pt that she is available 5-mood -meets with PCP this week   -Topamax causing her to get forgetful   -has been struggling to be on-task -feels "down, not depressed" because their is so much (going on) -pt admits to feeling overwhelmed and tired -having trouble staying asleep -PHQ-9    09/12/2021   12:24 PM 05/16/2021   12:13 PM  Depression screen PHQ 2/9  Decreased Interest 1 1  Down, Depressed, Hopeless 1 0  PHQ - 2 Score 2  1  Altered sleeping 3 2  Tired, decreased energy 2 2  Change in appetite 1 1  Feeling bad or failure about yourself  0 1  Trouble concentrating 0 1  Moving slowly or fidgety/restless 0 0  Suicidal thoughts 0 0  PHQ-9 Score 8 8  Difficult doing work/chores Somewhat difficult Somewhat difficult   Problems Addressed  Anxiety, Grief / Loss Unresolved, Unipolar Depression Goals 1. Alleviate depressive symptoms and return to previous level of effective functioning. 2. Begin a healthy grieving process around the loss. Objective Reengage in activities with family, friends, coworkers, and others. Target Date: 2022-02-22 Frequency: biweekly  Progress: 50 Modality:  individual  Related Interventions Assist the client in recommitting and reengaging in the primary social positive roles in which he/she has functioned prior to the loss. Objective Begin verbalizing feelings associated with the loss. Target Date: 2022-02-22 Frequency: biweekly  Progress: 10 Modality: individual  Related Interventions Assist the client in identifying and expressing feelings connected with his/her loss. 3. Develop healthy interpersonal relationships that lead to the alleviation and help prevent the relapse of depression. Objective Increasingly verbalize hopeful and positive statements regarding self, others, and the future. Target Date: 2022-02-22 Frequency: biweekly  Progress: 30 Modality: individual  Related Interventions Teach the client more about depression and how to recognize and accept some sadness as a normal variation in feeling. Objective Learn and implement relapse prevention skills. Target Date: 2022-02-22 Frequency: biweekly  Progress: 10 Modality: individual  Related Interventions Discuss with the client the distinction between a lapse and relapse, associating a lapse with a rather common, temporary setback that may involve, for example, re-experiencing a depressive thought and/or urge to withdraw or avoid (perhaps as related to some loss or conflict) and a relapse as a sustained return to a pattern of depressive thinking and feeling usually accompanied by interpersonal withdrawal and/or avoidance. Objective Implement mindfulness techniques for relapse prevention. Target Date: 2022-02-22 Frequency: biweekly  Progress: 10 Modality: individual  Related Interventions Use mindfulness meditation and cognitive therapy techniques to help the client learn to recognize and regulate the negative thought processes associated with depression and to change his/her relationship with these thoughts (see Mindfulness-Based Cognitive Therapy for Depression by Walden Field,  and Clyde Canterbury). Objective Learn and implement behavioral strategies to overcome depression. Target Date: 2022-02-22 Frequency: biweekly  Progress: 20 Modality: individual  Related Interventions Assist the client in developing skills that increase the likelihood of deriving pleasure from behavioral activation (e.g., assertiveness skills, developing an exercise plan, less internal/more external focus, increased social involvement); reinforce success. Objective Identify and replace thoughts and beliefs that support depression. Target Date: 2022-02-22 Frequency: biweekly  Progress: 30 Modality: individual  Related Interventions Facilitate and reinforce the client's shift from biased depressive self-talk and beliefs to reality-based cognitive messages that enhance self-confidence and increase adaptive actions (see "Positive Self-Talk" in the Adult Psychotherapy Homework Planner by Bryn Gulling). Objective Verbalize an accurate understanding of depression. Target Date: 2022-02-22 Frequency: biweekly  Progress: 60 Modality: individual  Related Interventions Consistent with the treatment model, discuss how cognitive, behavioral, interpersonal, and/or other factors (e.g., family history) contribute to depression. 4. Develop healthy thinking patterns and beliefs about self, others, and the world that lead to the alleviation and help prevent the relapse of depression. 5. Learn and implement coping skills that result in a reduction of anxiety and worry, and improved daily functioning. Objective Learn and implement relapse prevention strategies for managing possible future anxiety symptoms. Target Date: 2022-02-22 Frequency: biweekly  Progress: 10 Modality: individual  Related Interventions Discuss with the client the distinction between a lapse and relapse, associating a lapse with an initial and reversible return of worry, anxiety symptoms, or urges to avoid, and relapse with the decision to continue the  fearful and avoidant patterns. Objective Learn and implement personal and interpersonal skills to reduce anxiety and improve interpersonal relationships. Target Date: 2022-02-22 Frequency: biweekly  Progress: 50 Modality: individual  Related Interventions Use instruction, modeling, and role-playing to build the client's general social, communication, and/or conflict resolution skills. Objective Identify and engage in pleasant activities on a daily basis. Target Date: 2022-02-22 Frequency: biweekly  Progress: 40 Modality: individual  Related Interventions Engage the client in behavioral activation, increasing the client's contact with sources of reward, identifying processes that inhibit activation, and teaching skills to solve life problems (or assign "Identify and Schedule Pleasant Activities" in the Adult Psychotherapy Homework Planner by Jongsma); use behavioral techniques such as instruction, rehearsal, role-playing, role reversal as needed to assist adoption into the client's daily life; reinforce success. Objective Learn and implement problem-solving strategies for realistically addressing worries. Target Date: 2022-02-22 Frequency: biweekly  Progress: 100 Modality: individual  Related Interventions Teach the client problem-solving strategies involving specifically defining a problem, generating options for addressing it, evaluating the pros and cons of each option, selecting and implementing an optional action, and reevaluating and refining the action (or assign "Applying Problem-Solving to Interpersonal Conflict" in the Adult Psychotherapy Homework Planner by Bryn Gulling). Objective Identify, challenge, and replace biased, fearful self-talk with positive, realistic, and empowering self-talk. Target Date: 2022-02-22 Frequency: biweekly  Progress: 20 Modality: individual  Related Interventions Explore the client's schema and self-talk that mediate his/her fear response; assist him/her in  challenging the biases; replace the distorted messages with reality-based alternatives and positive, realistic self-talk that will increase his/her self-confidence in coping with irrational fears (see Cognitive Therapy of Anxiety Disorders by Alison Stalling). Objective Verbalize an understanding of the role that cognitive biases play in excessive irrational worry and persistent anxiety symptoms. Target Date: 2022-02-22 Frequency: biweekly  Progress: 100 Modality: individual  Related Interventions Assist the client in analyzing his/her worries by examining potential biases such as the probability of the negative expectation occurring, the real consequences of it occurring, his/her ability to control the outcome, the worst possible outcome, and his/her ability to accept it (see "Analyze the Probability of a Feared Event" in the Adult Psychotherapy Homework Planner by Bryn Gulling; Cognitive Therapy of Anxiety Disorders by Alison Stalling). Objective Learn and implement a strategy to limit the association between various environmental settings and worry, delaying the worry until a designated "worry time." Target Date: 2022-02-22 Frequency: biweekly  Progress: 60 Modality: individual  Related Interventions Teach the client how to recognize, stop, and postpone worry to the agreed upon worry time using skills such as thought stopping, relaxation, and redirecting attention (or assign "Making Use of the Thought-Stopping Technique" and/or "Worry Time" in the Adult Psychotherapy Homework Planner by Jongsma to assist skill development); encourage use in daily life; review and reinforce success while providing corrective feedback toward improvement. Explain the rationale for using a worry time as well as how it is to be used; agree upon and implement a worry time with the client. Objective Learn and implement calming skills to reduce overall anxiety and manage anxiety symptoms. Target Date: 2022-02-22 Frequency:  biweekly  Progress: 70 Modality: individual  Related Interventions Teach the client calming/relaxation skills (e.g., applied relaxation, progressive muscle relaxation, cue-controlled relaxation; mindful breathing; biofeedback) and how to discriminate better between  relaxation and tension; teach the client how to apply these skills to his/her daily life (e.g., New Directions in Progressive Muscle Relaxation by Casper Harrison, and Hazlett-Stevens; Treating Generalized Anxiety Disorder by Rygh and Amparo Bristol). Objective Verbalize an understanding of the cognitive, physiological, and behavioral components of anxiety and its treatment. Target Date: 2022-02-22 Frequency: biweekly  Progress: 100 Modality: individual  Related Interventions Discuss how generalized anxiety typically involves excessive worry about unrealistic threats, various bodily expressions of tension, overarousal, and hypervigilance, and avoidance of what is threatening that interact to maintain the problem (see Mastery of Your Anxiety and Worry: Therapist Guide by Corky Mull, and Barlow; Treating Generalized Anxiety Disorder by Rygh and Amparo Bristol). 6. Recognize, accept, and cope with feelings of depression. 7. Reduce overall frequency, intensity, and duration of the anxiety so that daily functioning is not impaired.  Diagnosis:Major depressive disorder, recurrent episode, moderate ([place])  Adjustment disorder with anxiety  Plan:  -meet again on Monday, October 31, 2021 at 12pm  Francie Massing, [facility]

## 2021-09-14 ENCOUNTER — Other Ambulatory Visit (HOSPITAL_BASED_OUTPATIENT_CLINIC_OR_DEPARTMENT_OTHER): Payer: Self-pay

## 2021-09-14 ENCOUNTER — Ambulatory Visit: Payer: 59 | Admitting: Physician Assistant

## 2021-09-14 ENCOUNTER — Encounter: Payer: Self-pay | Admitting: Physician Assistant

## 2021-09-14 ENCOUNTER — Telehealth: Payer: Self-pay

## 2021-09-14 VITALS — BP 107/65 | HR 76 | Ht 69.0 in | Wt 176.0 lb

## 2021-09-14 DIAGNOSIS — R152 Fecal urgency: Secondary | ICD-10-CM | POA: Diagnosis not present

## 2021-09-14 DIAGNOSIS — R4189 Other symptoms and signs involving cognitive functions and awareness: Secondary | ICD-10-CM

## 2021-09-14 DIAGNOSIS — R159 Full incontinence of feces: Secondary | ICD-10-CM | POA: Diagnosis not present

## 2021-09-14 DIAGNOSIS — K5909 Other constipation: Secondary | ICD-10-CM | POA: Diagnosis not present

## 2021-09-14 DIAGNOSIS — K5900 Constipation, unspecified: Secondary | ICD-10-CM | POA: Insufficient documentation

## 2021-09-14 DIAGNOSIS — Z13220 Encounter for screening for lipoid disorders: Secondary | ICD-10-CM

## 2021-09-14 DIAGNOSIS — Z79899 Other long term (current) drug therapy: Secondary | ICD-10-CM

## 2021-09-14 DIAGNOSIS — G43009 Migraine without aura, not intractable, without status migrainosus: Secondary | ICD-10-CM | POA: Diagnosis not present

## 2021-09-14 DIAGNOSIS — Z131 Encounter for screening for diabetes mellitus: Secondary | ICD-10-CM

## 2021-09-14 HISTORY — DX: Other symptoms and signs involving cognitive functions and awareness: R41.89

## 2021-09-14 MED ORDER — LUBIPROSTONE 24 MCG PO CAPS
24.0000 ug | ORAL_CAPSULE | Freq: Two times a day (BID) | ORAL | 1 refills | Status: DC
  Filled 2021-09-14: qty 60, 30d supply, fill #0

## 2021-09-14 MED ORDER — NURTEC 75 MG PO TBDP
ORAL_TABLET | ORAL | 2 refills | Status: DC
  Filled 2021-09-14: qty 16, 30d supply, fill #0
  Filled 2021-10-17: qty 16, 30d supply, fill #1
  Filled 2021-12-16: qty 16, 30d supply, fill #2

## 2021-09-14 NOTE — Patient Instructions (Addendum)
Taper off topamax Start nurtec every other day Start amitiza   Fecal Incontinence Fecal incontinence, also called accidental bowel leakage, is not being able to control your bowels. This condition happens because the nerves or muscles around the anus do not work the way they should. This affects their ability to hold stool (feces). What are the causes? This condition may be caused by: Damage to the muscles at the end of the rectum (sphincter). Damage to the nerves that control bowel movements. Diarrhea. Chronic constipation. Pelvic floor dysfunction. This means the muscles in the pelvis do not work well. Loss of bowel storage capacity. This occurs when the rectum can no longer stretch in size in order to store feces. Inflammatory bowel disease (IBD), such as Crohn's disease. Irritable bowel syndrome (IBS). What increases the risk? You are more likely to develop this condition if you: Were born with bowels or a pelvis that did not form correctly. Have had rectal surgery. Have had radiation treatment for certain cancers. Have been pregnant, had a vaginal delivery, or had surgery that damaged the pelvic floor muscles. Had a complicated childbirth, spinal cord injury, or other trauma that caused nerve damage. Have a condition that can affect nerve function, such as diabetes, Parkinson's disease, or multiple sclerosis. Have a condition where the rectum drops down into the anus or vagina (prolapse). Are 44 years of age or older. What are the signs or symptoms? The main symptom of this condition is not being able to control your bowels. You also might not be able to get to the bathroom before a bowel movement. How is this diagnosed? This condition is diagnosed with a medical history and physical exam. You may also have other tests, including: Blood tests. Urine tests. A rectal exam. Ultrasound. MRI. Colonoscopy. This is an exam that looks at your large intestine (colon). Anal manometry.  This is a test that measures the strength of the anal sphincter. Anal electromyogram (EMG). This is a test that uses small electrodes to check for nerve damage. How is this treated? Treatment for this condition depends on the cause and severity. Treatment may also focus on addressing any underlying causes of this condition. Treatment may include: Medicines. This may include medicines to: Prevent diarrhea. Help with constipation (bulk-forming laxatives). Treat any underlying conditions. Biofeedback therapy. This can help to retrain muscles that are affected. Fiber supplements. These can help manage your bowel movements. Nerve stimulation. Injectable gel to promote tissue growth and better muscle control. Surgery. You may need: Sphincter repair surgery. Diversion surgery. This procedure lets feces pass out of your body through a hole in your abdomen. Follow these instructions at home: Eating and drinking  Follow instructions from your health care provider about any eating or drinking restrictions. Work with a dietitian to come up with a healthy diet that will help you avoid the foods that can make your condition worse. Keep a diet diary to find out which foods or drinks could be making your condition worse. Drink enough fluid to keep your urine pale yellow. Lifestyle Do not use any products that contain nicotine or tobacco, such as cigarettes and e-cigarettes. If you need help quitting, ask your health care provider. This may help your condition. If you are overweight, talk with your health care provider about how to safely lose weight. This may help your condition. Increase your physical activity as told by your health care provider. This may help your condition. Always talk with your health care provider before starting a new  exercise program. Carry a change of clothes and supplies to clean up quickly if you have an episode of fecal incontinence. Consider joining a fecal incontinence  support group. You can find a support group online or in your local community. General instructions  Take over-the-counter and prescription medicines only as told by your health care provider. This includes any supplements. Apply a moisture barrier, such as petroleum jelly, to your rectum. This protects the skin from irritation caused by ongoing leaking or diarrhea. Tell your health care provider if you are upset or depressed about your condition. Keep all follow-up visits as told by your health care provider. This is important. Where to find more information [employer]astrointestinal Disorders: iffgd.org [employer]: patients.gi.org Contact a health care provider if: You have a fever. You have redness, swelling, or pain around your rectum. Your pain is getting worse or you lose feeling in your rectal area. You have blood in your stool. You feel sad or hopeless. You avoid social or work situations. Get help right away if: You stop having bowel movements. You cannot eat or drink without vomiting. You have rectal bleeding that does not stop. You have severe pain that is getting worse. You have symptoms of dehydration, including: Sleepiness or fatigue. Producing little or no urine, tears, or sweat. Dizziness. Dry mouth. Unusual irritability. Headache. Inability to think clearly. Summary Fecal incontinence, also called accidental bowel leakage, is not being able to control your bowels. This condition happens because the nerves or muscles around the anus do not work the way they should. Treatment varies depending on the cause and severity of your condition. Treatment may also focus on addressing any underlying causes of this condition. Follow instructions from your health care provider about any eating or drinking restrictions, lifestyle changes, and skin care. Take over-the-counter and prescription medicines only as told by your  health care provider. This includes any supplements. Tell your health care provider if your symptoms worsen or if you are upset or depressed about your condition. This information is not intended to replace advice given to you by your health care provider. Make sure you discuss any questions you have with your health care provider.  Chronic Constipation Chronic constipation is a condition in which a person has three or fewer bowel movements a week, for 3 months or longer. This condition is especially common in older adults. What are the causes? Causes of chronic constipation may include: Not drinking enough fluid, eating enough food or fiber, or getting enough physical activity. Pregnancy. A tear in the anus (anal fissure). Blockage in the bowel (bowel obstruction). Narrowing of the bowel (bowel stricture). Having a long-term medical condition, such as: Diabetes, hypothyroidism, or iron-deficiency anemia. Stroke or spinal cord injury. Multiple sclerosis or Parkinson's disease. Colon cancer. Dementia. Inflammatory bowel disease (IBD), outward collapse of the rectum (rectal prolapse), or hemorrhoids. Taking certain medicines, including: Narcotics. These are a certain type of prescription pain medicine. Antacids or iron supplements. Water pills (diuretics). Certain blood pressure medicines. Anti-seizure medicines. Antidepressants. Medicines for Parkinson's disease. Other causes of this condition may include: Stress. Problems in the nerves and muscles that control the movement of stool. Weak or impaired pelvic floor muscles. What increases the risk? You may be at higher risk for chronic constipation if: You are older than age 31. You are female. You live in a long-term care facility. You have a long-term disease. You have a mental health disorder or eating disorder. What are the signs  or symptoms? The main symptom of chronic constipation is having three or fewer bowel movements a  week for several weeks. Other signs and symptoms may vary from person to person. These include: Pushing hard (straining) to pass stool, or having hard or lumpy stools. Painful bowel movements. Having lower abdominal discomfort, such as cramps or bloating. Being unable to have a bowel movement when you feel the urge, or feeling like you still need to pass stool after a bowel movement. Feeling that you have something in your rectum that is blocking or preventing bowel movements. Seeing blood on the toilet paper or in your stool. Worsening confusion (in older adults). How is this diagnosed? This condition may be diagnosed based on: Your symptoms and medical history. You will be asked about your symptoms, lifestyle, diet, and any medicines that you are taking. A physical exam. Your abdomen will be examined. A digital rectal exam may be done. For this exam, a health care provider places a lubricated, gloved finger into the rectum. Tests to check for any underlying causes of your constipation. These may be ordered if you have bleeding in your rectum, weight loss, or a family history of colon cancer. In these cases, you may have: Imaging studies of the colon. These may include X-ray, ultrasound, or a CT scan. Blood tests. A procedure to examine the inside of your colon (colonoscopy). More specialized tests to check: Whether your anal sphincter works well. This is a ring-shaped muscle that controls the closing of the anus. How well food moves through your colon. Tests to measure the nerve signal in your pelvic floor muscles (electromyography). How is this treated? Treatment for chronic constipation depends on the cause. Most often, treatment starts with: Being more active and getting regular exercise. Drinking more fluids. Adding fiber to your diet. Sources of fiber include fruits, vegetables, whole grains, and fiber supplements. Using medicines such as stool softeners or medicines that increase  contractions in your digestive system (pro-motility agents). Training your pelvic muscles with biofeedback. Surgery, if there is obstruction. Treatment may also include: Stopping or changing some medicines if they cause constipation. Using a fiber supplement (bulk laxative) or stool softener. Using a prescription laxative. This works by [employer] into your colon (osmotic laxative). You may also need to see a specialist who treats conditions of the digestive system (gastroenterologist). Follow these instructions at home: Medicines Take over-the-counter and prescription medicines only as told by your health care provider. If you are taking a laxative, take it as told by your health care provider. Eating and drinking  Eat a balanced diet that includes enough fiber. Ask your health care provider to recommend a diet that is right for you. Drink clear fluids, especially water. Avoid drinking alcohol, caffeine, and soda. These can make constipation worse. Drink enough fluid to keep your urine pale yellow. General instructions Get some physical activity every day. Ask your health care provider what activities are safe for you. Get colon cancer screenings as told by your health care provider. Keep all follow-up visits as told by your health care provider. This is important. Contact a health care provider if you have: Three or fewer bowel movements a week. Stools that are hard or lumpy. Blood on the toilet paper or in your stool after you have a bowel movement. Unexplained weight loss. Rectum (rectal) pain. Stool leakage. Nausea or vomiting. Get help right away if you have: Rectal bleeding or you pass blood clots. Severe rectal pain. Body tissue that pushes out (  protrudes) from your anus. Severe pain or bloating (distension) in your abdomen. Vomiting that you cannot control. Summary Chronic constipation is a condition in which a person has three or fewer bowel movements a week, for 3  months or longer. You may have a higher risk for this condition if you are an older adult, you are female, or you have a long-term disease. Treatment for this condition depends on the cause. Most treatments for chronic constipation include adding fiber to your diet, drinking more fluids, and getting more physical activity. You may also need to treat any underlying medical conditions or stop or change certain medicines if they cause constipation. If lifestyle changes do not relieve constipation, your health care provider may recommend taking a laxative. This information is not intended to replace advice given to you by your health care provider. Make sure you discuss any questions you have with your health care provider. Document Revised: 02/05/2019 Document Reviewed: 02/05/2019 Elsevier Patient Education  2023 Elsevier Inc.  Document Revised: 09/28/2020 Document Reviewed: 09/29/2020 Elsevier Patient Education  2023 [employer].
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
## 2021-09-14 NOTE — Telephone Encounter (Addendum)
Initiated Prior authorization for: Nurtec 75 mg tab  Via: Covermymeds Case/Key:[id number] Status: approved  as of 09/14/21 Reason:The authorization is effective for a maximum of 6 fills from 09/14/2021 to 03/15/2022 Notified Pt via: Mychart

## 2021-09-15 ENCOUNTER — Other Ambulatory Visit (HOSPITAL_BASED_OUTPATIENT_CLINIC_OR_DEPARTMENT_OTHER): Payer: Self-pay

## 2021-09-23 ENCOUNTER — Other Ambulatory Visit (HOSPITAL_BASED_OUTPATIENT_CLINIC_OR_DEPARTMENT_OTHER): Payer: Self-pay

## 2021-09-28 ENCOUNTER — Other Ambulatory Visit (HOSPITAL_BASED_OUTPATIENT_CLINIC_OR_DEPARTMENT_OTHER): Payer: Self-pay

## 2021-09-28 ENCOUNTER — Telehealth: Payer: Self-pay | Admitting: Neurology

## 2021-09-28 DIAGNOSIS — E079 Disorder of thyroid, unspecified: Secondary | ICD-10-CM

## 2021-09-28 NOTE — Telephone Encounter (Signed)
Received note from pharmacy that they have to change Levothyroxine manufacturer. Okay given per Lesly Rubenstein. Labs ordered for 6 weeks. Patient made aware.

## 2021-10-10 ENCOUNTER — Other Ambulatory Visit: Payer: Self-pay | Admitting: Neurology

## 2021-10-10 ENCOUNTER — Other Ambulatory Visit (HOSPITAL_BASED_OUTPATIENT_CLINIC_OR_DEPARTMENT_OTHER): Payer: Self-pay

## 2021-10-10 DIAGNOSIS — F3181 Bipolar II disorder: Secondary | ICD-10-CM

## 2021-10-10 MED ORDER — CARIPRAZINE HCL 1.5 MG PO CAPS
1.5000 mg | ORAL_CAPSULE | Freq: Every day | ORAL | 1 refills | Status: DC
  Filled 2021-10-10: qty 60, 60d supply, fill #0
  Filled 2021-10-10: qty 30, 30d supply, fill #0
  Filled 2022-01-24: qty 90, 90d supply, fill #1

## 2021-10-11 ENCOUNTER — Other Ambulatory Visit (HOSPITAL_BASED_OUTPATIENT_CLINIC_OR_DEPARTMENT_OTHER): Payer: Self-pay

## 2021-10-14 ENCOUNTER — Other Ambulatory Visit (HOSPITAL_BASED_OUTPATIENT_CLINIC_OR_DEPARTMENT_OTHER): Payer: Self-pay

## 2021-10-14 ENCOUNTER — Other Ambulatory Visit: Payer: Self-pay | Admitting: Medical-Surgical

## 2021-10-14 MED ORDER — CLOTRIMAZOLE-BETAMETHASONE 1-0.05 % EX CREA
1.0000 | TOPICAL_CREAM | Freq: Every day | CUTANEOUS | 0 refills | Status: DC
  Filled 2021-10-14: qty 30, 30d supply, fill #0

## 2021-10-14 NOTE — Progress Notes (Signed)
Pleasant 46 year old female with complaints of a small red, scaly rash to the lower posterior calf on the right leg. Rash is not itchy and has not spread. Thinks her dog may have been exposed to something outside and then came in, cuddling up to her and rubbing her backside on her leg. Sending in Lotrisone BID to the affected area for up to 14 days.  ___________________________________________ Thayer Ohm, DNP, APRN, FNP-BC Primary Care and Sports Medicine [facility] [place]

## 2021-10-17 ENCOUNTER — Other Ambulatory Visit: Payer: Self-pay | Admitting: Physician Assistant

## 2021-10-17 ENCOUNTER — Other Ambulatory Visit (HOSPITAL_BASED_OUTPATIENT_CLINIC_OR_DEPARTMENT_OTHER): Payer: Self-pay

## 2021-10-17 MED ORDER — LEVOCETIRIZINE DIHYDROCHLORIDE 5 MG PO TABS
ORAL_TABLET | Freq: Every evening | ORAL | 1 refills | Status: DC
  Filled 2021-10-17: qty 90, 90d supply, fill #0
  Filled 2021-12-16 – 2022-01-24 (×2): qty 90, 90d supply, fill #1

## 2021-10-17 MED ORDER — RIZATRIPTAN BENZOATE 10 MG PO TABS
ORAL_TABLET | ORAL | 1 refills | Status: DC | PRN
  Filled 2021-10-17: qty 18, 30d supply, fill #0

## 2021-10-24 ENCOUNTER — Other Ambulatory Visit (HOSPITAL_BASED_OUTPATIENT_CLINIC_OR_DEPARTMENT_OTHER): Payer: Self-pay

## 2021-10-28 NOTE — Progress Notes (Unsigned)
   ANNUAL EXAM Patient name: Kathleen Phillips MRN [id number]  Date of birth: 1976/03/19 Chief Complaint:   No chief complaint on file.  History of Present Illness:   Kathleen Phillips is a 46 y.o. G2P0010 female being seen today for a routine annual exam.   Current complaints: ***  No LMP recorded. Patient has had an ablation.  Current birth control: ***  Last pap: 09/09/2020. Results were: NILM w/ HRHPV negative. H/O abnormal pap: yes *** Last MXR: 07/2020  Review of Systems:   Pertinent items are noted in HPI Denies any headaches, blurred vision, fatigue, shortness of breath, chest pain, abdominal pain, abnormal vaginal discharge/itching/odor/irritation, problems with periods, bowel movements, urination, or intercourse unless otherwise stated above. *** Pertinent History Reviewed:  Reviewed past medical,surgical, social and family history.  Reviewed problem list, medications and allergies. Physical Assessment:  There were no vitals filed for this visit.There is no height or weight on file to calculate BMI.   Physical Examination:  General appearance - well appearing, and in no distress Mental status - alert, oriented to person, place, and time Psych:  She has a normal mood and affect Skin - warm and dry, normal color, no suspicious lesions noted Chest - effort normal Heart - normal rate  Breasts - breasts appear normal, no suspicious masses, no skin or nipple changes or axillary nodes Abdomen - soft, nontender, nondistended, no masses or organomegaly Pelvic -  VULVA: normal appearing vulva with no masses, tenderness or lesions  VAGINA: normal appearing vagina with normal color and discharge, no lesions  CERVIX: normal appearing cervix without discharge or lesions, no CMT UTERUS: uterus is felt to be normal size, shape, consistency and nontender  ADNEXA: No adnexal masses or tenderness noted. Extremities:  No swelling or varicosities noted  Chaperone present for exam  No  results found for this or any previous visit (from the past 24 hour(s)).  Assessment & Plan:  Diagnoses and all orders for this visit:  Encounter for annual routine gynecological examination  - Cervical cancer screening: Discussed guidelines. Pap with HPV wnl 09/2020 - Gardasil: {Blank single:19197::"***","has not yet had. Will provide information","completed","has not yet had. Counseling provided and she declines","Has not yet had. Counseling provided and pt accepts"} - STD Testing: {Blank single:19197::"accepts","declines","not indicated"} - Birth Control: Discussed options and their risks, benefits and common side effects; discussed VTE with estrogen containing options. Desires: {Birth control type:23956} - Breast Health: Encouraged self breast awareness/SBE. Teaching provided. Discussed limits of clinical breast exam for detecting breast cancer. Rx given for MXR - F/U 12 months and prn     No orders of the defined types were placed in this encounter.   Meds: No orders of the defined types were placed in this encounter.   Follow-up: No follow-ups on file.  Milas Hock, MD 10/28/2021 10:49 PM

## 2021-10-31 ENCOUNTER — Ambulatory Visit (INDEPENDENT_AMBULATORY_CARE_PROVIDER_SITE_OTHER): Payer: 59 | Admitting: Professional

## 2021-10-31 ENCOUNTER — Encounter: Payer: Self-pay | Admitting: Professional

## 2021-10-31 DIAGNOSIS — F4322 Adjustment disorder with anxiety: Secondary | ICD-10-CM | POA: Diagnosis not present

## 2021-10-31 DIAGNOSIS — F331 Major depressive disorder, recurrent, moderate: Secondary | ICD-10-CM | POA: Diagnosis not present
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
## 2021-11-03 ENCOUNTER — Encounter: Payer: Self-pay | Admitting: Obstetrics and Gynecology

## 2021-11-03 ENCOUNTER — Ambulatory Visit (INDEPENDENT_AMBULATORY_CARE_PROVIDER_SITE_OTHER): Payer: 59 | Admitting: Obstetrics and Gynecology

## 2021-11-03 VITALS — BP 106/72 | HR 66 | Ht 69.0 in | Wt 189.0 lb

## 2021-11-03 DIAGNOSIS — Z01419 Encounter for gynecological examination (general) (routine) without abnormal findings: Secondary | ICD-10-CM | POA: Diagnosis not present

## 2021-11-04 ENCOUNTER — Other Ambulatory Visit (HOSPITAL_BASED_OUTPATIENT_CLINIC_OR_DEPARTMENT_OTHER): Payer: Self-pay

## 2021-11-16 ENCOUNTER — Encounter: Payer: Self-pay | Admitting: Physician Assistant

## 2021-11-16 ENCOUNTER — Encounter: Payer: Self-pay | Admitting: Sports Medicine

## 2021-11-16 ENCOUNTER — Ambulatory Visit (INDEPENDENT_AMBULATORY_CARE_PROVIDER_SITE_OTHER): Payer: 59 | Admitting: Physician Assistant

## 2021-11-16 VITALS — BP 111/73 | HR 71 | Ht 69.0 in | Wt 190.0 lb

## 2021-11-16 DIAGNOSIS — Z131 Encounter for screening for diabetes mellitus: Secondary | ICD-10-CM | POA: Diagnosis not present

## 2021-11-16 DIAGNOSIS — Z13220 Encounter for screening for lipoid disorders: Secondary | ICD-10-CM

## 2021-11-16 DIAGNOSIS — Z0000 Encounter for general adult medical examination without abnormal findings: Secondary | ICD-10-CM

## 2021-11-16 DIAGNOSIS — E079 Disorder of thyroid, unspecified: Secondary | ICD-10-CM | POA: Diagnosis not present

## 2021-11-16 NOTE — Patient Instructions (Signed)
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
## 2021-11-17 LAB — CBC WITH DIFFERENTIAL/PLATELET
Absolute Monocytes: 332 cells/uL (ref 200–950)
Basophils Absolute: 20 cells/uL (ref 0–200)
Basophils Relative: 0.5 %
Eosinophils Absolute: 100 cells/uL (ref 15–500)
Eosinophils Relative: 2.5 %
HCT: 36.7 % (ref 35.0–45.0)
Hemoglobin: 12.1 g/dL (ref 11.7–15.5)
Lymphs Abs: 1460 cells/uL (ref 850–3900)
MCH: 31.8 pg (ref 27.0–33.0)
MCHC: 33 g/dL (ref 32.0–36.0)
MCV: 96.3 fL (ref 80.0–100.0)
MPV: 11.1 fL (ref 7.5–12.5)
Monocytes Relative: 8.3 %
Neutro Abs: 2088 cells/uL (ref 1500–7800)
Neutrophils Relative %: 52.2 %
Platelets: 138 10*3/uL — ABNORMAL LOW (ref 140–400)
RBC: 3.81 10*6/uL (ref 3.80–5.10)
RDW: 12.2 % (ref 11.0–15.0)
Total Lymphocyte: 36.5 %
WBC: 4 10*3/uL (ref 3.8–10.8)

## 2021-11-17 LAB — COMPLETE METABOLIC PANEL WITHOUT GFR
AG Ratio: 2 (calc) (ref 1.0–2.5)
ALT: 11 U/L (ref 6–29)
AST: 18 U/L (ref 10–30)
Albumin: 4.1 g/dL (ref 3.6–5.1)
Alkaline phosphatase (APISO): 54 U/L (ref 31–125)
BUN: 10 mg/dL (ref 7–25)
CO2: 30 mmol/L (ref 20–32)
Calcium: 8.9 mg/dL (ref 8.6–10.2)
Chloride: 105 mmol/L (ref 98–110)
Creat: 0.8 mg/dL (ref 0.50–0.99)
Globulin: 2.1 g/dL (ref 1.9–3.7)
Glucose, Bld: 89 mg/dL (ref 65–99)
Potassium: 4.1 mmol/L (ref 3.5–5.3)
Sodium: 139 mmol/L (ref 135–146)
Total Bilirubin: 0.7 mg/dL (ref 0.2–1.2)
Total Protein: 6.2 g/dL (ref 6.1–8.1)
eGFR: 94 mL/min/1.73m2

## 2021-11-17 LAB — LIPID PANEL W/REFLEX DIRECT LDL
Cholesterol: 146 mg/dL
HDL: 60 mg/dL
LDL Cholesterol (Calc): 71 mg/dL
Non-HDL Cholesterol (Calc): 86 mg/dL
Total CHOL/HDL Ratio: 2.4 (calc)
Triglycerides: 73 mg/dL

## 2021-11-17 LAB — TSH: TSH: 2.56 m[IU]/L

## 2021-11-17 NOTE — Progress Notes (Signed)
Kathleen Phillips,   Kidney, liver, glucose look great.  Cholesterol looks wonderful.  Thyroid normal.  Platelets a little low. Recheck in 4-6 weeks.

## 2021-11-18 ENCOUNTER — Other Ambulatory Visit: Payer: Self-pay | Admitting: Neurology

## 2021-11-18 DIAGNOSIS — D696 Thrombocytopenia, unspecified: Secondary | ICD-10-CM

## 2021-11-21 ENCOUNTER — Ambulatory Visit: Payer: 59 | Admitting: Professional

## 2021-11-25 ENCOUNTER — Encounter: Payer: Self-pay | Admitting: Professional

## 2021-11-25 ENCOUNTER — Ambulatory Visit (INDEPENDENT_AMBULATORY_CARE_PROVIDER_SITE_OTHER): Payer: 59 | Admitting: Professional

## 2021-11-25 DIAGNOSIS — F3181 Bipolar II disorder: Secondary | ICD-10-CM | POA: Diagnosis not present

## 2021-11-25 DIAGNOSIS — F4322 Adjustment disorder with anxiety: Secondary | ICD-10-CM | POA: Diagnosis not present

## 2021-11-25 NOTE — Progress Notes (Signed)
[facility] Counselor/Therapist Progress Note  Patient ID: Kathleen Phillips, MRN: [id number],    Date: 11/25/2021  Time Spent: 49 minutes 1155-1243pm  Treatment Type: Individual Therapy  Risk Assessment: Danger to Self:  No Self-injurious Behavior: No Danger to Others: No  Subjective: The patient arrived early for her in-person session.  Issues addressed: 1-marital -[name] can no longer get I/out of their home due to steps -they looked at a town home and placed  -they have been approved for the purchase of a town home in [place] -pt reports her spouse did not balk at moving -he is being less difficult in his interactions with her -she provided him a Airline pilot and he does his devotionals everyday -relying on God to see it through 2-back to school -taking geography -it is not traditional geography   -taking more of a cultural approach 3-downside to moving -they cannot bring the two dogs   -he got very quiet when she shared   -pt talked with his daughter about her taking the dogs     -she agreed to take the dogs 3-daughter -she talked to her but her daughter told her the phone was breaking up and she hung up -she still has not met her fiance -husband -has good days and bad days -she drives his around when they are going somewhere together 4-reviewed objectives with pt -pt improved on 16/17 objectives and stayed the same on one -pt feels prepared to go to as needed sessions -discussed with pt how to secure a session when needed  Problems Addressed  Anxiety, Grief / Loss Unresolved, Unipolar Depression Goals 1. Alleviate depressive symptoms and return to previous level of effective functioning. 2. Begin a healthy grieving process around the loss. Objective Reengage in activities with family, friends, coworkers, and others. Target Date: 2022-02-22 Frequency: as needed  Progress: 100 Modality: individual  Related Interventions Assist the client in  recommitting and reengaging in the primary social positive roles in which he/she has functioned prior to the loss. Objective Begin verbalizing feelings associated with the loss. Target Date: 2022-02-22 Frequency: as needed  Progress: 50 Modality: individual  Related Interventions Assist the client in identifying and expressing feelings connected with his/her loss. 3. Develop healthy interpersonal relationships that lead to the alleviation and help prevent the relapse of depression. Objective Increasingly verbalize hopeful and positive statements regarding self, others, and the future. Target Date: 2022-02-22 Frequency: as needed  Progress: 49 Modality: individual  Related Interventions Teach the client more about depression and how to recognize and accept some sadness as a normal variation in feeling. Objective Learn and implement relapse prevention skills. Target Date: 2022-02-22 Frequency: as needed  Progress: 50 Modality: individual  Related Interventions Discuss with the client the distinction between a lapse and relapse, associating a lapse with a rather common, temporary setback that may involve, for example, re-experiencing a depressive thought and/or urge to withdraw or avoid (perhaps as related to some loss or conflict) and a relapse as a sustained return to a pattern of depressive thinking and feeling usually accompanied by interpersonal withdrawal and/or avoidance. Objective Implement mindfulness techniques for relapse prevention. Target Date: 2022-02-22 Frequency: as needed  Progress: 50 Modality: individual  Related Interventions Use mindfulness meditation and cognitive therapy techniques to help the client learn to recognize and regulate the negative thought processes associated with depression and to change his/her relationship with these thoughts (see Mindfulness-Based Cognitive Therapy for Depression by Walden Field, and Clyde Canterbury). Objective Learn and implement  behavioral strategies to  overcome depression. Target Date: 2022-02-22 Frequency: as needed  Progress: 50 Modality: individual  Related Interventions Assist the client in developing skills that increase the likelihood of deriving pleasure from behavioral activation (e.g., assertiveness skills, developing an exercise plan, less internal/more external focus, increased social involvement); reinforce success. Objective Identify and replace thoughts and beliefs that support depression. Target Date: 2022-02-22 Frequency: as needed  Progress: 50 Modality: individual  Related Interventions Facilitate and reinforce the client's shift from biased depressive self-talk and beliefs to reality-based cognitive messages that enhance self-confidence and increase adaptive actions (see "Positive Self-Talk" in the Adult Psychotherapy Homework Planner by Bryn Gulling). Objective Verbalize an accurate understanding of depression. Target Date: 2022-02-22 Frequency: as needed  Progress: 4 Modality: individual  Related Interventions Consistent with the treatment model, discuss how cognitive, behavioral, interpersonal, and/or other factors (e.g., family history) contribute to depression. 4. Develop healthy thinking patterns and beliefs about self, others, and the world that lead to the alleviation and help prevent the relapse of depression. 5. Learn and implement coping skills that result in a reduction of anxiety and worry, and improved daily functioning. Objective Learn and implement relapse prevention strategies for managing possible future anxiety symptoms. Target Date: 2022-02-22 Frequency: as needed  Progress: 50 Modality: individual  Related Interventions Discuss with the client the distinction between a lapse and relapse, associating a lapse with an initial and reversible return of worry, anxiety symptoms, or urges to avoid, and relapse with the decision to continue the fearful and avoidant  patterns. Objective Learn and implement personal and interpersonal skills to reduce anxiety and improve interpersonal relationships. Target Date: 2022-02-22 Frequency: as needed  Progress: 50 Modality: individual  Related Interventions Use instruction, modeling, and role-playing to build the client's general social, communication, and/or conflict resolution skills. Objective Identify and engage in pleasant activities on a daily basis. Target Date: 2022-02-22 Frequency: as needed  Progress: 100 Modality: individual  Related Interventions Engage the client in behavioral activation, increasing the client's contact with sources of reward, identifying processes that inhibit activation, and teaching skills to solve life problems (or assign "Identify and Schedule Pleasant Activities" in the Adult Psychotherapy Homework Planner by Jongsma); use behavioral techniques such as instruction, rehearsal, role-playing, role reversal as needed to assist adoption into the client's daily life; reinforce success. Objective Learn and implement problem-solving strategies for realistically addressing worries. Target Date: 2022-02-22 Frequency: as needed  Progress: 100 Modality: individual  Related Interventions Teach the client problem-solving strategies involving specifically defining a problem, generating options for addressing it, evaluating the pros and cons of each option, selecting and implementing an optional action, and reevaluating and refining the action (or assign "Applying Problem-Solving to Interpersonal Conflict" in the Adult Psychotherapy Homework Planner by Bryn Gulling). Objective Identify, challenge, and replace biased, fearful self-talk with positive, realistic, and empowering self-talk. Target Date: 2022-02-22 Frequency: as needed  Progress: 67 Modality: individual  Related Interventions Explore the client's schema and self-talk that mediate his/her fear response; assist him/her in challenging the  biases; replace the distorted messages with reality-based alternatives and positive, realistic self-talk that will increase his/her self-confidence in coping with irrational fears (see Cognitive Therapy of Anxiety Disorders by Alison Stalling). Objective Verbalize an understanding of the role that cognitive biases play in excessive irrational worry and persistent anxiety symptoms. Target Date: 2022-02-22 Frequency: as needed  Progress: 100 Modality: individual  Related Interventions Assist the client in analyzing his/her worries by examining potential biases such as the probability of the negative expectation occurring, the real consequences of it  occurring, his/her ability to control the outcome, the worst possible outcome, and his/her ability to accept it (see "Analyze the Probability of a Feared Event" in the Adult Psychotherapy Homework Planner by Bryn Gulling; Cognitive Therapy of Anxiety Disorders by Alison Stalling). Objective Learn and implement a strategy to limit the association between various environmental settings and worry, delaying the worry until a designated "worry time." Target Date: 2022-02-22 Frequency: as needed  Progress: 80 Modality: individual  Related Interventions Teach the client how to recognize, stop, and postpone worry to the agreed upon worry time using skills such as thought stopping, relaxation, and redirecting attention (or assign "Making Use of the Thought-Stopping Technique" and/or "Worry Time" in the Adult Psychotherapy Homework Planner by Jongsma to assist skill development); encourage use in daily life; review and reinforce success while providing corrective feedback toward improvement. Explain the rationale for using a worry time as well as how it is to be used; agree upon and implement a worry time with the client. Objective Learn and implement calming skills to reduce overall anxiety and manage anxiety symptoms. Target Date: 2022-02-22 Frequency: as needed   Progress: 14 Modality: individual  Related Interventions Teach the client calming/relaxation skills (e.g., applied relaxation, progressive muscle relaxation, cue-controlled relaxation; mindful breathing; biofeedback) and how to discriminate better between relaxation and tension; teach the client how to apply these skills to his/her daily life (e.g., New Directions in Progressive Muscle Relaxation by Casper Harrison, and Hazlett-Stevens; Treating Generalized Anxiety Disorder by Rygh and Amparo Bristol). Objective Verbalize an understanding of the cognitive, physiological, and behavioral components of anxiety and its treatment. Target Date: 2022-02-22 Frequency: as needed  Progress: 100 Modality: individual  Related Interventions Discuss how generalized anxiety typically involves excessive worry about unrealistic threats, various bodily expressions of tension, overarousal, and hypervigilance, and avoidance of what is threatening that interact to maintain the problem (see Mastery of Your Anxiety and Worry: Therapist Guide by Corky Mull, and Barlow; Treating Generalized Anxiety Disorder by Rygh and Amparo Bristol). 6. Recognize, accept, and cope with feelings of depression. 7. Reduce overall frequency, intensity, and duration of the anxiety so that daily functioning is not impaired.  Diagnosis:Bipolar 2 disorder, major depressive episode ([place])  Adjustment disorder with anxiety  Plan:  -meet as needed  Francie Massing, [facility]

## 2021-12-06 ENCOUNTER — Ambulatory Visit (INDEPENDENT_AMBULATORY_CARE_PROVIDER_SITE_OTHER): Payer: 59 | Admitting: Sports Medicine

## 2021-12-06 ENCOUNTER — Other Ambulatory Visit: Payer: Self-pay | Admitting: Sports Medicine

## 2021-12-06 ENCOUNTER — Ambulatory Visit (INDEPENDENT_AMBULATORY_CARE_PROVIDER_SITE_OTHER): Payer: 59

## 2021-12-06 DIAGNOSIS — M25561 Pain in right knee: Secondary | ICD-10-CM

## 2021-12-06 DIAGNOSIS — G8929 Other chronic pain: Secondary | ICD-10-CM

## 2021-12-06 DIAGNOSIS — M1711 Unilateral primary osteoarthritis, right knee: Secondary | ICD-10-CM | POA: Diagnosis not present

## 2021-12-06 DIAGNOSIS — Z09 Encounter for follow-up examination after completed treatment for conditions other than malignant neoplasm: Secondary | ICD-10-CM | POA: Diagnosis not present

## 2021-12-06 DIAGNOSIS — M1712 Unilateral primary osteoarthritis, left knee: Secondary | ICD-10-CM | POA: Diagnosis not present

## 2021-12-06 HISTORY — DX: Other chronic pain: G89.29

## 2021-12-06 NOTE — Progress Notes (Signed)
    Procedures performed today:    Procedure: Real-time Ultrasound Guided injection of the right knee Device: Samsung HS60  Verbal informed consent obtained.  Time-out conducted.  Noted no overlying erythema, induration, or other signs of local infection.  Skin prepped in a sterile fashion.  Local anesthesia: Topical Ethyl chloride.  With sterile technique and under real time ultrasound guidance: Trace effusion noted, 1 cc Kenalog 40, 2 cc lidocaine, 2 cc bupivacaine injected easily Completed without difficulty  Advised to call if fevers/chills, erythema, induration, drainage, or persistent bleeding.  Images permanently stored and available for review in PACS.  Impression: Technically successful ultrasound guided injection.  Independent interpretation of notes and tests performed by another provider:   X-rays personally reviewed with the patient, very mild degenerative changes, hardware noted right medial femoral condyle, unclear as to whether this protrudes into the articular surface.  Brief History, Exam, Impression, and Recommendations:    Chronic pain of right knee Kathleen Phillips 46 year old female, chronic right knee pain, medial femoral condyle, history of femoral fracture with retained hardware. I did personally review the x-rays, very mild degenerative changes. Unsure as to whether there is a chondral defect at the tip of the retained hardware. Considering pain medially, failure of conservative treatment in the form of meloxicam, conditioning and icing, we proceed with an injection today. Continue conservative treatment and revisit in 6 weeks with an MRI if not better.    ____________________________________________ Ihor Austin. Benjamin Stain, M.D., ABFM., CAQSM., AME. Primary Care and Sports Medicine [facility]  Adjunct Professor of Family Medicine  [employer]  Restaurant manager, fast food

## 2021-12-06 NOTE — Assessment & Plan Note (Signed)
Pleasant 46 year old female, chronic right knee pain, medial femoral condyle, history of femoral fracture with retained hardware. I did personally review the x-rays, very mild degenerative changes. Unsure as to whether there is a chondral defect at the tip of the retained hardware. Considering pain medially, failure of conservative treatment in the form of meloxicam, conditioning and icing, we proceed with an injection today. Continue conservative treatment and revisit in 6 weeks with an MRI if not better.

## 2021-12-12 ENCOUNTER — Ambulatory Visit: Payer: 59 | Admitting: Professional

## 2021-12-15 ENCOUNTER — Ambulatory Visit: Payer: 59

## 2021-12-16 ENCOUNTER — Other Ambulatory Visit (HOSPITAL_BASED_OUTPATIENT_CLINIC_OR_DEPARTMENT_OTHER): Payer: Self-pay

## 2021-12-19 ENCOUNTER — Other Ambulatory Visit (HOSPITAL_BASED_OUTPATIENT_CLINIC_OR_DEPARTMENT_OTHER): Payer: Self-pay

## 2022-01-02 ENCOUNTER — Ambulatory Visit: Payer: 59 | Admitting: Professional

## 2022-01-23 ENCOUNTER — Ambulatory Visit: Payer: 59 | Admitting: Professional

## 2022-01-24 ENCOUNTER — Other Ambulatory Visit (HOSPITAL_BASED_OUTPATIENT_CLINIC_OR_DEPARTMENT_OTHER): Payer: Self-pay

## 2022-01-24 ENCOUNTER — Other Ambulatory Visit: Payer: Self-pay | Admitting: Physician Assistant

## 2022-01-24 DIAGNOSIS — G43009 Migraine without aura, not intractable, without status migrainosus: Secondary | ICD-10-CM

## 2022-01-24 MED ORDER — NURTEC 75 MG PO TBDP
75.0000 mg | ORAL_TABLET | ORAL | 2 refills | Status: DC
  Filled 2022-01-24: qty 16, 30d supply, fill #0
  Filled 2022-03-01: qty 16, 30d supply, fill #1
  Filled 2022-03-16 – 2022-04-28 (×2): qty 16, 30d supply, fill #2

## 2022-01-25 ENCOUNTER — Other Ambulatory Visit (HOSPITAL_BASED_OUTPATIENT_CLINIC_OR_DEPARTMENT_OTHER): Payer: Self-pay

## 2022-02-01 IMAGING — DX DG HAND COMPLETE 3+V*L*
3 series · 3 of 3 positions shown · non-contrast
Comparison: None.

CLINICAL DATA: Pain in both hands, especially when opening and
closing the fingers or making a fist. Trauma history not specified.

EXAM:
LEFT HAND - COMPLETE 3+ VIEW

[hand pa]
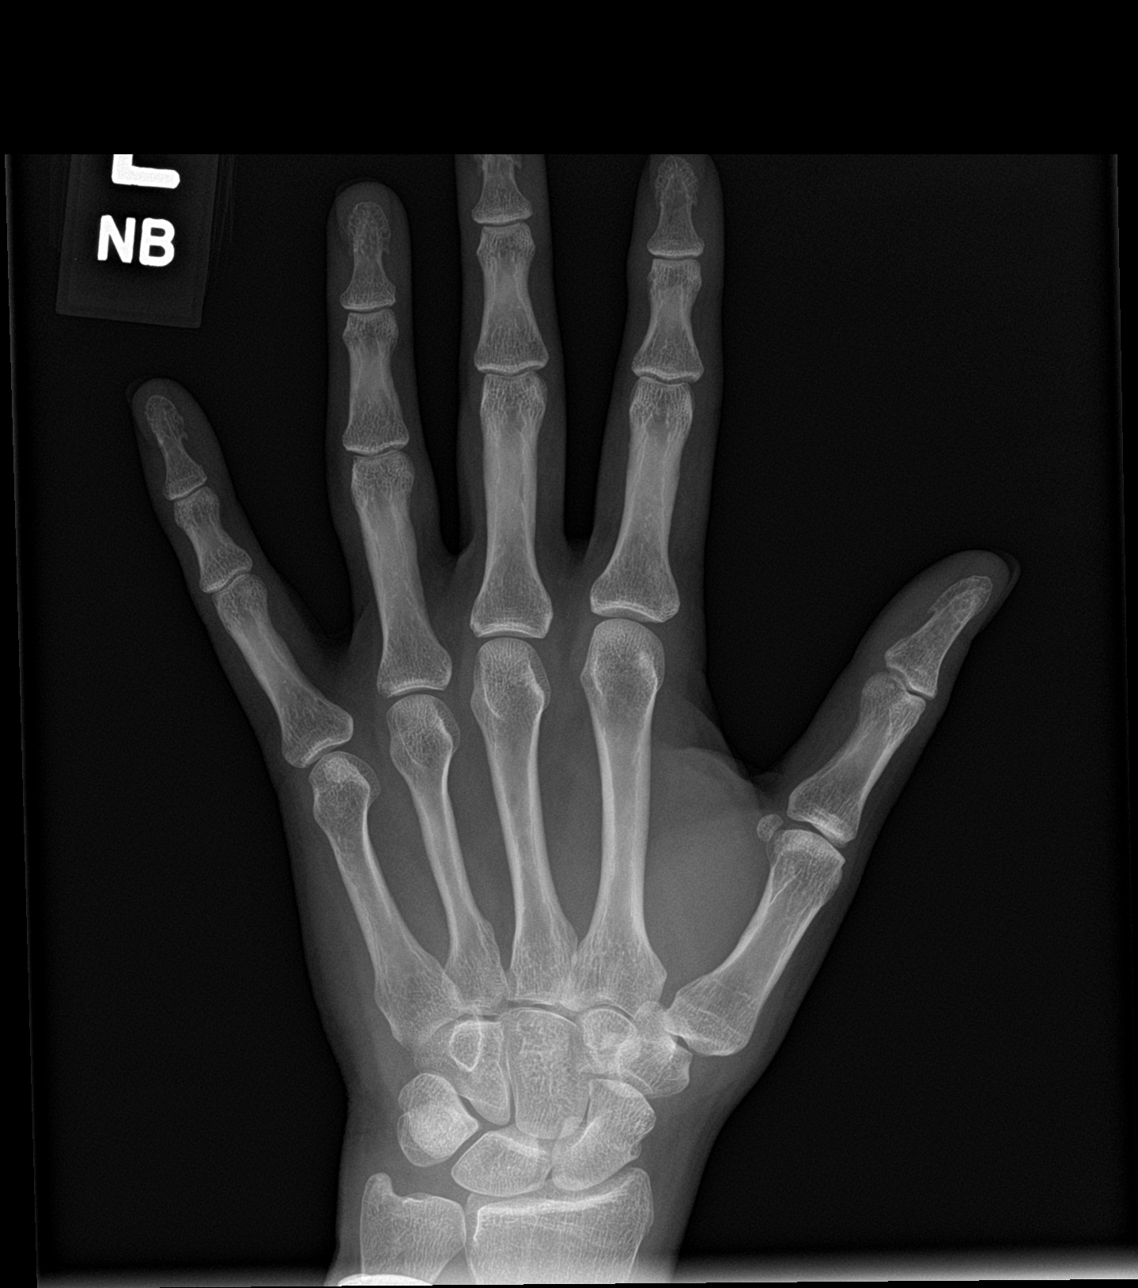

[hand obl]
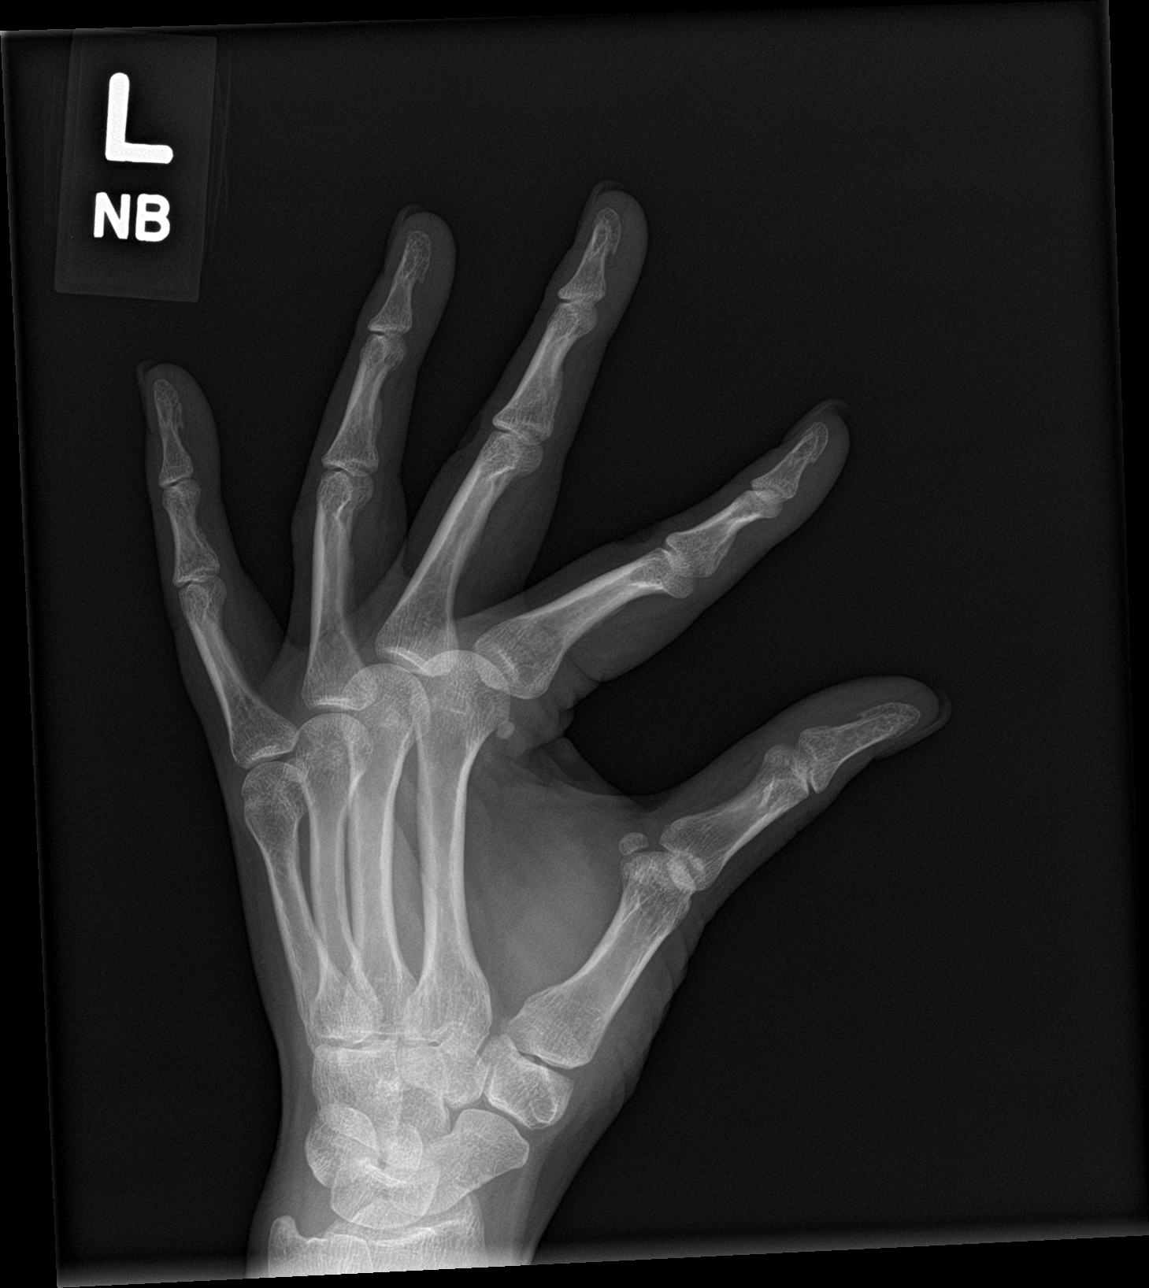

[hand lat]
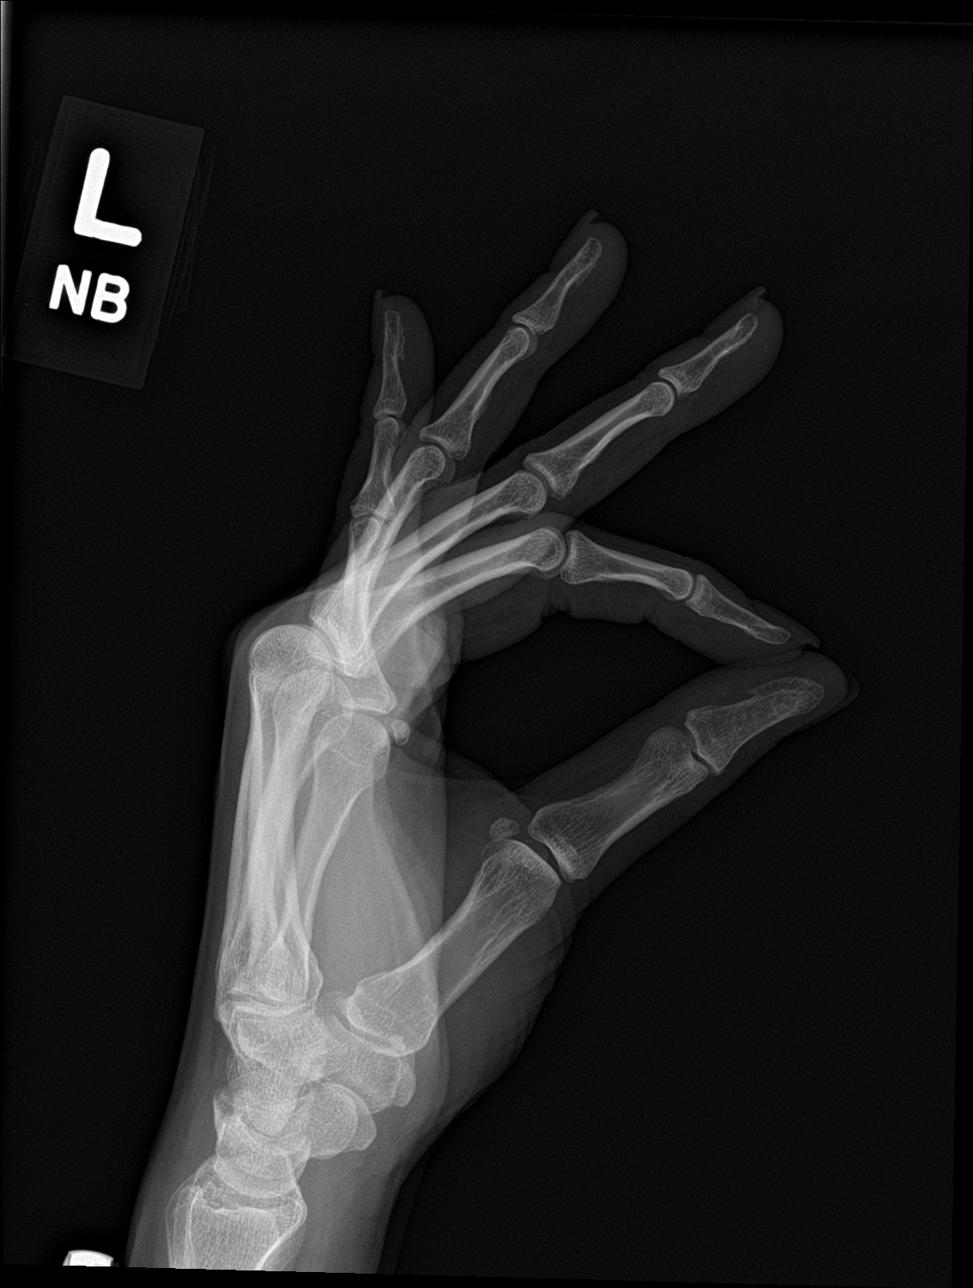

[3 of 3 positions shown; findings below may reference images not displayed]

FINDINGS: There is no evidence of fracture or dislocation. There is no
evidence of arthropathy or other focal bone abnormality. Soft
tissues are unremarkable.
IMPRESSION: Negative.

## 2022-02-01 IMAGING — DX DG HAND COMPLETE 3+V*R*
3 series · 3 of 3 positions shown · non-contrast
Comparison: None.

CLINICAL DATA: Bilateral hand pain, especially with opening and
closing the fingers were making a fist. Trauma history not specified

EXAM:
RIGHT HAND - COMPLETE 3+ VIEW

[hand pa]
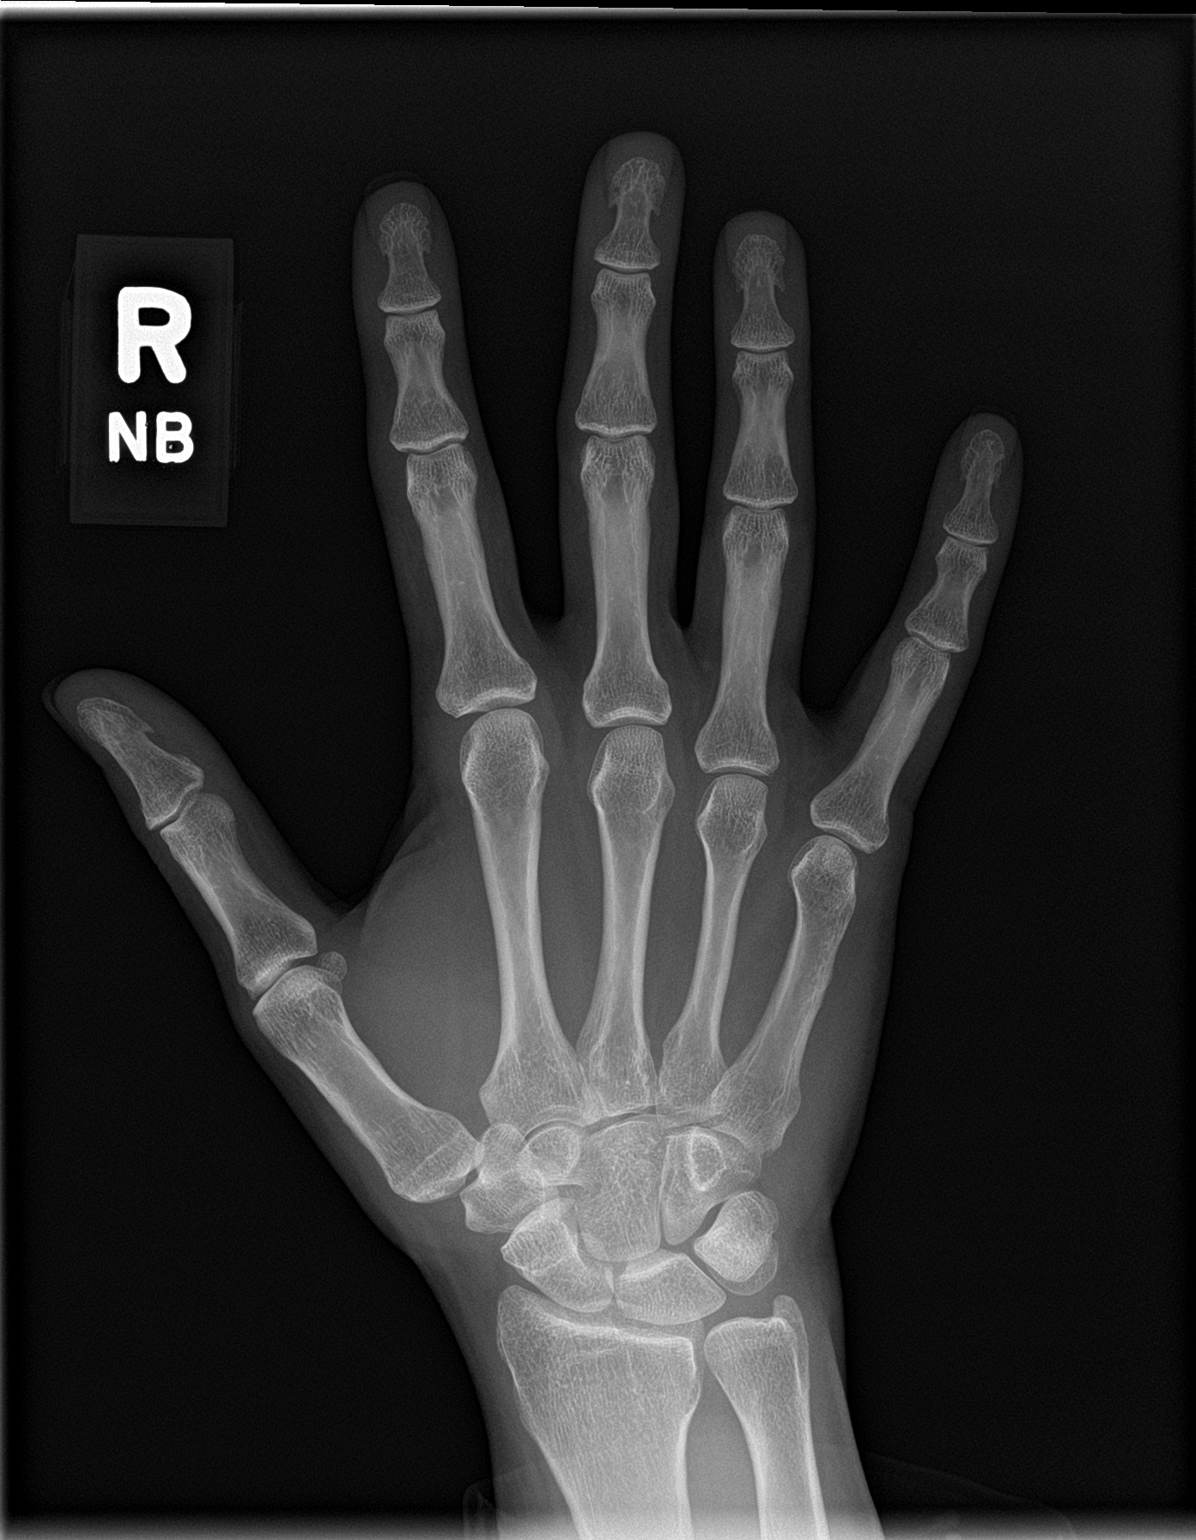

[hand obl]
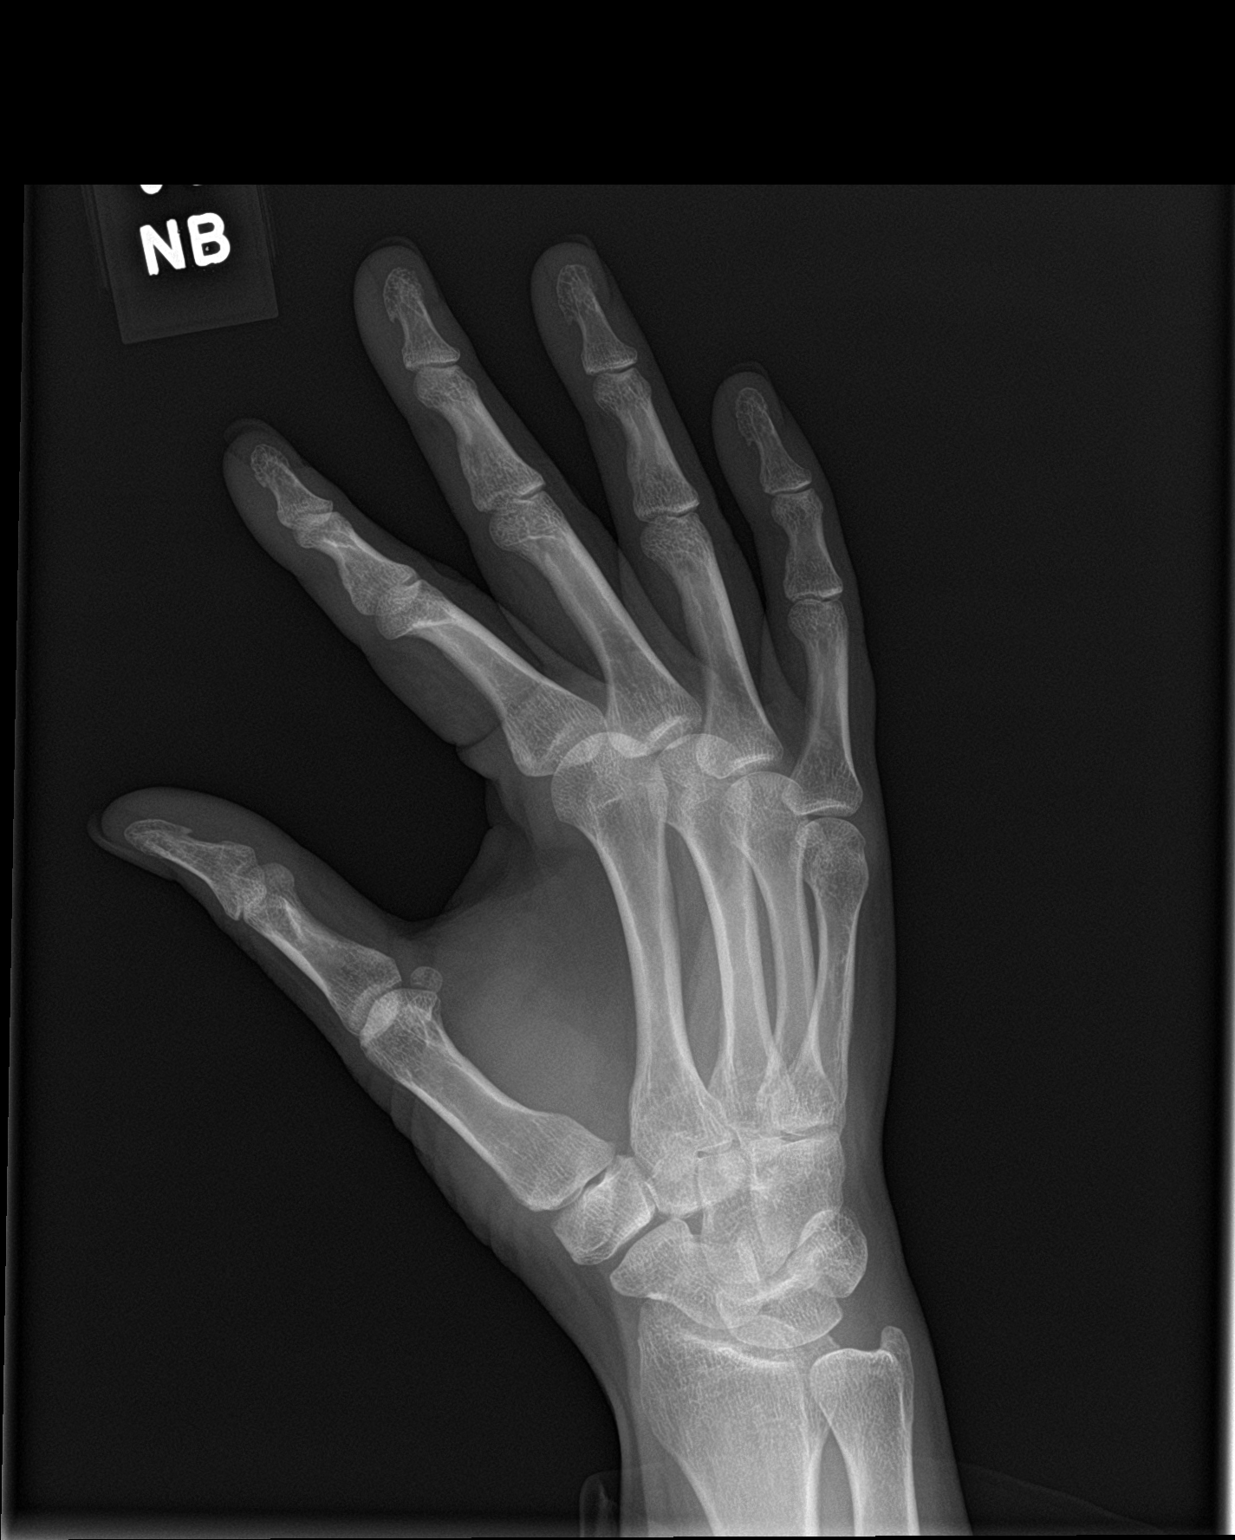

[hand lat]
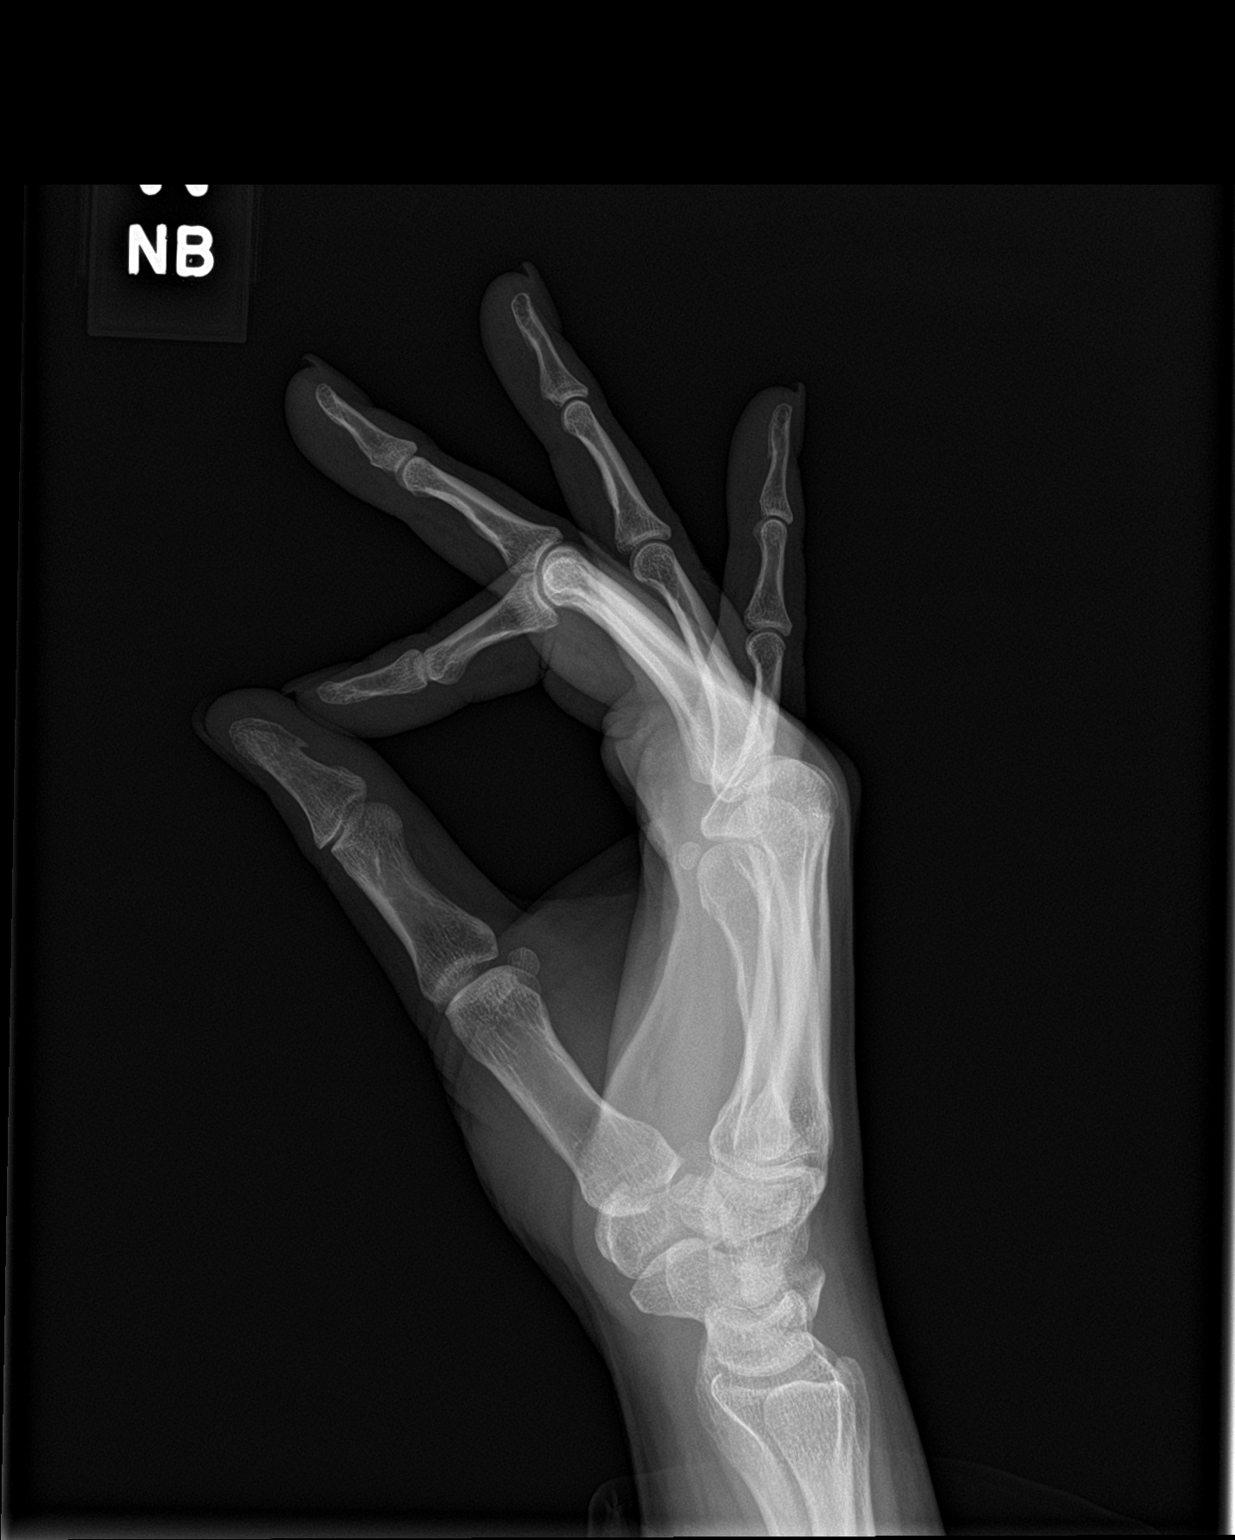

[3 of 3 positions shown; findings below may reference images not displayed]

FINDINGS: There is no evidence of fracture or dislocation. There is mild
narrowing and nonerosive spurring of the first CMC joint, slight
narrowing and trace spurring of the thumb MCP and IP joints,
otherwise no further evidence of arthropathy or other focal bone
abnormality. Soft tissues are unremarkable.
IMPRESSION: Mild features of nonerosive degenerative arthrosis along the first
ray. No evidence of erosive arthropathy , fractures or other acute
findings. No abnormal calcifications.

## 2022-02-07 ENCOUNTER — Other Ambulatory Visit: Payer: Self-pay | Admitting: Physician Assistant

## 2022-02-07 DIAGNOSIS — E079 Disorder of thyroid, unspecified: Secondary | ICD-10-CM

## 2022-02-08 ENCOUNTER — Other Ambulatory Visit (HOSPITAL_BASED_OUTPATIENT_CLINIC_OR_DEPARTMENT_OTHER): Payer: Self-pay

## 2022-02-08 MED ORDER — LEVOTHYROXINE SODIUM 50 MCG PO TABS
50.0000 ug | ORAL_TABLET | Freq: Every day | ORAL | 3 refills | Status: DC
  Filled 2022-02-08 – 2022-03-01 (×2): qty 90, 90d supply, fill #0
  Filled 2022-06-02: qty 90, 90d supply, fill #1
  Filled 2022-06-13: qty 90, 90d supply, fill #2
  Filled 2022-06-15: qty 60, 60d supply, fill #2
  Filled 2022-07-18: qty 90, 90d supply, fill #2

## 2022-02-13 ENCOUNTER — Ambulatory Visit: Payer: 59 | Admitting: Professional

## 2022-02-16 ENCOUNTER — Other Ambulatory Visit (HOSPITAL_BASED_OUTPATIENT_CLINIC_OR_DEPARTMENT_OTHER): Payer: Self-pay

## 2022-02-28 ENCOUNTER — Ambulatory Visit (INDEPENDENT_AMBULATORY_CARE_PROVIDER_SITE_OTHER): Payer: 59 | Admitting: Family Medicine

## 2022-02-28 ENCOUNTER — Other Ambulatory Visit (HOSPITAL_BASED_OUTPATIENT_CLINIC_OR_DEPARTMENT_OTHER): Payer: Self-pay

## 2022-02-28 VITALS — BP 110/80 | HR 94 | Ht 69.0 in | Wt 190.0 lb

## 2022-02-28 DIAGNOSIS — F5101 Primary insomnia: Secondary | ICD-10-CM

## 2022-02-28 DIAGNOSIS — F411 Generalized anxiety disorder: Secondary | ICD-10-CM

## 2022-02-28 DIAGNOSIS — F3181 Bipolar II disorder: Secondary | ICD-10-CM | POA: Diagnosis not present

## 2022-02-28 HISTORY — DX: Generalized anxiety disorder: F41.1

## 2022-02-28 MED ORDER — HYDROXYZINE PAMOATE 25 MG PO CAPS
25.0000 mg | ORAL_CAPSULE | Freq: Every evening | ORAL | 0 refills | Status: DC
  Filled 2022-02-28: qty 30, 30d supply, fill #0

## 2022-02-28 MED ORDER — CARIPRAZINE HCL 3 MG PO CAPS
3.0000 mg | ORAL_CAPSULE | Freq: Every day | ORAL | 3 refills | Status: DC
  Filled 2022-02-28: qty 90, 90d supply, fill #0
  Filled 2022-08-14: qty 90, 90d supply, fill #1

## 2022-02-28 MED ORDER — BUSPIRONE HCL 15 MG PO TABS
15.0000 mg | ORAL_TABLET | Freq: Three times a day (TID) | ORAL | 0 refills | Status: DC | PRN
  Filled 2022-02-28: qty 10, 4d supply, fill #0

## 2022-02-28 NOTE — Assessment & Plan Note (Signed)
-   have added buspar to patient regimen and gave 15mg  tablets so she can split it by 5mg , 7.5mg , 10mg , 15mg   - gave 10 tablets to see if patient would benefit from this

## 2022-02-28 NOTE — Assessment & Plan Note (Signed)
-   increased vraylar to 3mg  to see if we can get better control of symptoms - also recommended she schedule a few therapy sessions with Ms. to help cope during this difficult time

## 2022-02-28 NOTE — Progress Notes (Signed)
Established patient visit   Patient: Kathleen Phillips   DOB: September 07, 1975   46 y.o. Female  MRN: [id number] Visit Date: 02/28/2022  Today's healthcare provider: Charlton Amor, DO   Chief Complaint  Patient presents with   Follow-up    SUBJECTIVE    Chief Complaint  Patient presents with   Follow-up   HPI  Pt presents for follow up. She notes worsening bipolar symptoms and would like to increase her vraylar. The holiday weekend was difficult for her. She admits to feeling overwhelmed.   Insomnia  Pt has not been able to sleep since yesterday morning at 7am. Events at work made her remember her mom's passing and then with her husband being sick this has been a difficult time for her. She has been tossing and turning and has trouble relaxing her mind to sleep.   Review of Systems  Constitutional:  Negative for activity change, fatigue and fever.  Respiratory:  Negative for cough and shortness of breath.   Cardiovascular:  Negative for chest pain.  Gastrointestinal:  Negative for abdominal pain.  Genitourinary:  Negative for difficulty urinating.       Current Meds  Medication Sig   busPIRone (BUSPAR) 15 MG tablet Take 1 tablet (15 mg total) by mouth 3 (three) times daily as needed.   cariprazine (VRAYLAR) 3 MG capsule Take 1 capsule (3 mg total) by mouth daily.   Cholecalciferol (VITAMIN D) 50 MCG (2000 UT) tablet Take 2,000 Units by mouth daily.   clotrimazole-betamethasone (LOTRISONE) cream Apply 1 application on to the skin daily.   EPINEPHrine 0.3 mg/0.3 mL IJ SOAJ injection Inject 0.3 mg into the muscle as needed for anaphylaxis.   hydrOXYzine (VISTARIL) 25 MG capsule Take 1 capsule (25 mg total) by mouth at bedtime.   Insulin Pen Needle 32G X 6 MM MISC Use as directed with Saxenda   Insulin Pen Needle 32G X 6 MM MISC Use as directed with Saxenda   levothyroxine (SYNTHROID) 50 MCG tablet Take 1 tablet (50 mcg total) by mouth daily.   Liraglutide -Weight Management  (SAXENDA) 18 MG/3ML SOPN Inject 3 mg into the skin daily.   meloxicam (MOBIC) 15 MG tablet Take 1 tablet (15 mg total) by mouth daily.   Multiple Vitamins-Minerals (MULTIVITAMIN WITH MINERALS) tablet Take 1 tablet by mouth daily.   Rimegepant Sulfate (NURTEC) 75 MG TBDP Take 1 tablet (75 mg total) by mouth every other day for migraine prevention.   rizatriptan (MAXALT) 10 MG tablet TAKE 1 TABLET (10 MG TOTAL) BY MOUTH AS NEEDED FOR MIGRAINE. MAY REPEAT IN 2 HOURS IF NEEDED   Suvorexant (BELSOMRA) 20 MG TABS Take 20 mg by mouth at bedtime.   traMADol (ULTRAM) 50 MG tablet Take 1-2 tablets (50-100 mg total) by mouth every 8 (eight) hours as needed for moderate pain. Maximum 6 tabs per day.   UNABLE TO FIND CBD oil   valACYclovir (VALTREX) 500 MG tablet Take one tablet every 12 hours for 3 days for outbreaks.   Vilazodone HCl (VIIBRYD) 40 MG TABS TAKE 1 TABLET (40 MG) BY MOUTH DAILY.   [DISCONTINUED] cariprazine (VRAYLAR) 1.5 MG capsule Take 1 capsule (1.5 mg total) by mouth daily.   [DISCONTINUED] levocetirizine (XYZAL) 5 MG tablet TAKE 1 TABLET (5 MG TOTAL) BY MOUTH EVERY EVENING.    OBJECTIVE    BP 110/80   Pulse 94   Ht 5\' 9"  (1.753 m)   Wt 190 lb (86.2 kg)   SpO2 99%  BMI 28.06 kg/m   Physical Exam Vitals reviewed.  Constitutional:      Appearance: She is well-developed.  HENT:     Head: Normocephalic and atraumatic.  Eyes:     Conjunctiva/sclera: Conjunctivae normal.  Cardiovascular:     Rate and Rhythm: Normal rate.  Pulmonary:     Effort: Pulmonary effort is normal.  Skin:    General: Skin is dry.     Coloration: Skin is not pale.  Neurological:     Mental Status: She is alert and oriented to person, place, and time.  Psychiatric:        Behavior: Behavior normal.          ASSESSMENT & PLAN    Problem List Items Addressed This Visit       Other   Mixed bipolar II disorder (HCC) - Primary    - increased vraylar to 3mg  to see if we can get better control of  symptoms - also recommended she schedule a few therapy sessions with Ms. to help cope during this difficult time      Relevant Medications   cariprazine (VRAYLAR) 3 MG capsule   Generalized anxiety disorder    - have added buspar to patient regimen and gave 15mg  tablets so she can split it by 5mg , 7.5mg , 10mg , 15mg   - gave 10 tablets to see if patient would benefit from this      Relevant Medications   hydrOXYzine (VISTARIL) 25 MG capsule   busPIRone (BUSPAR) 15 MG tablet   Primary insomnia   Relevant Medications   hydrOXYzine (VISTARIL) 25 MG capsule    Return if symptoms worsen or fail to improve.      Meds ordered this encounter  Medications   cariprazine (VRAYLAR) 3 MG capsule    Sig: Take 1 capsule (3 mg total) by mouth daily.    Dispense:  90 capsule    Refill:  3   hydrOXYzine (VISTARIL) 25 MG capsule    Sig: Take 1 capsule (25 mg total) by mouth at bedtime.    Dispense:  30 capsule    Refill:  0   busPIRone (BUSPAR) 15 MG tablet    Sig: Take 1 tablet (15 mg total) by mouth 3 (three) times daily as needed.    Dispense:  10 tablet    Refill:  0    No orders of the defined types were placed in this encounter.    Olegario Messier, DO  [facility] [phone] (phone) [phone] (fax)  [facility]

## 2022-03-01 ENCOUNTER — Other Ambulatory Visit (HOSPITAL_BASED_OUTPATIENT_CLINIC_OR_DEPARTMENT_OTHER): Payer: Self-pay

## 2022-03-01 ENCOUNTER — Other Ambulatory Visit: Payer: Self-pay | Admitting: Physician Assistant

## 2022-03-01 ENCOUNTER — Ambulatory Visit: Payer: 59 | Admitting: Physician Assistant

## 2022-03-01 MED ORDER — MELOXICAM 15 MG PO TABS
15.0000 mg | ORAL_TABLET | Freq: Every day | ORAL | 1 refills | Status: DC
  Filled 2022-03-01: qty 90, 90d supply, fill #0
  Filled 2022-06-19: qty 90, 90d supply, fill #1

## 2022-03-16 ENCOUNTER — Other Ambulatory Visit (HOSPITAL_BASED_OUTPATIENT_CLINIC_OR_DEPARTMENT_OTHER): Payer: Self-pay

## 2022-03-16 ENCOUNTER — Other Ambulatory Visit: Payer: Self-pay | Admitting: Physician Assistant

## 2022-03-16 DIAGNOSIS — F3181 Bipolar II disorder: Secondary | ICD-10-CM

## 2022-03-16 DIAGNOSIS — E663 Overweight: Secondary | ICD-10-CM

## 2022-03-16 DIAGNOSIS — F332 Major depressive disorder, recurrent severe without psychotic features: Secondary | ICD-10-CM

## 2022-03-16 MED ORDER — SAXENDA 18 MG/3ML ~~LOC~~ SOPN
3.0000 mg | PEN_INJECTOR | Freq: Every day | SUBCUTANEOUS | 2 refills | Status: DC
  Filled 2022-03-16: qty 15, 30d supply, fill #0

## 2022-03-16 MED ORDER — VILAZODONE HCL 40 MG PO TABS
40.0000 mg | ORAL_TABLET | Freq: Every day | ORAL | 11 refills | Status: DC
  Filled 2022-03-16: qty 30, 30d supply, fill #0
  Filled 2022-04-28: qty 30, 30d supply, fill #1
  Filled 2022-06-02: qty 30, 30d supply, fill #2
  Filled 2022-06-13 – 2022-07-16 (×3): qty 30, 30d supply, fill #3
  Filled 2022-08-14: qty 30, 30d supply, fill #4
  Filled 2022-09-19: qty 30, 30d supply, fill #5

## 2022-03-20 ENCOUNTER — Encounter: Payer: Self-pay | Admitting: Medical-Surgical

## 2022-03-20 ENCOUNTER — Other Ambulatory Visit (HOSPITAL_BASED_OUTPATIENT_CLINIC_OR_DEPARTMENT_OTHER): Payer: Self-pay

## 2022-03-20 ENCOUNTER — Telehealth (INDEPENDENT_AMBULATORY_CARE_PROVIDER_SITE_OTHER): Payer: 59 | Admitting: Medical-Surgical

## 2022-03-20 DIAGNOSIS — H8110 Benign paroxysmal vertigo, unspecified ear: Secondary | ICD-10-CM | POA: Diagnosis not present

## 2022-03-20 MED ORDER — ONDANSETRON 8 MG PO TBDP
8.0000 mg | ORAL_TABLET | Freq: Three times a day (TID) | ORAL | 3 refills | Status: AC | PRN
  Filled 2022-03-20: qty 20, 7d supply, fill #0
  Filled 2023-01-04: qty 20, 7d supply, fill #1

## 2022-03-20 MED ORDER — MECLIZINE HCL 25 MG PO TABS
25.0000 mg | ORAL_TABLET | Freq: Three times a day (TID) | ORAL | 0 refills | Status: DC | PRN
  Filled 2022-03-20: qty 30, 10d supply, fill #0

## 2022-03-20 NOTE — Progress Notes (Signed)
Virtual Visit via Video Note  I connected with Kathleen Kuras on 03/20/22 at  9:30 AM EST by a video enabled telemedicine application and verified that I am speaking with the correct person using two identifiers.   I discussed the limitations of evaluation and management by telemedicine and the availability of in person appointments. The patient expressed understanding and agreed to proceed.  Patient location: home Provider locations: office  Subjective:    CC: dizzy/nausea  HPI: Pleasant 46 year old female presenting today via MyChart video visit with complaints of sudden onset of dizziness with nausea and vomiting. No fevers, chills, ringing in the ears, or hearing loss. Denies headache. No history of vertigo. Was a little nauseated yesterday but  had  no additional symptoms. Took a dose of Compazine this morning which has helped to hold down fluids. History of labile blood pressure with hypotensive episodes. Able to check BP while on visit with result of 118/76 and pulse of 76.    Past medical history, Surgical history, Family history not pertinant except as noted below, Social history, Allergies, and medications have been entered into the medical record, reviewed, and corrections made.   Review of Systems: See HPI for pertinent positives and negatives.   Objective:    General: Speaking clearly in complete sentences without any shortness of breath.  Alert and oriented x3.  Normal judgment. No apparent acute distress.  Impression and Recommendations:    1. Benign paroxysmal positional vertigo, unspecified laterality Symptoms consistent with vertigo however, unable to perform an exam for confirmation. Start meclizine 25mg  TID PRN dizziness/nausea. Refilling Zofran for prn use. Stay well hydrated. Recommend Epley maneuver exercises at home. MyChart message sent with instructions on how to complete these.   I discussed the assessment and treatment plan with the patient. The patient was  provided an opportunity to ask questions and all were answered. The patient agreed with the plan and demonstrated an understanding of the instructions.   The patient was advised to call back or seek an in-person evaluation if the symptoms worsen or if the condition fails to improve as anticipated.  20 minutes of non-face-to-face time was provided during this encounter.  Return if symptoms worsen or fail to improve.  , DNP, APRN, FNP-BC [facility] and Sports Medicine

## 2022-03-29 ENCOUNTER — Ambulatory Visit (INDEPENDENT_AMBULATORY_CARE_PROVIDER_SITE_OTHER): Payer: 59

## 2022-03-29 DIAGNOSIS — Z1231 Encounter for screening mammogram for malignant neoplasm of breast: Secondary | ICD-10-CM | POA: Diagnosis not present

## 2022-03-29 DIAGNOSIS — Z01419 Encounter for gynecological examination (general) (routine) without abnormal findings: Secondary | ICD-10-CM

## 2022-03-30 ENCOUNTER — Telehealth: Payer: Self-pay

## 2022-03-30 NOTE — Telephone Encounter (Signed)
Initiated Prior authorization [id number] 18MG /3ML pen-injectors  Via: Covermymeds Case/Key:[id number] Status: n/a as of 03/30/22 Reason:Member has an active PA on file which is expiring on 04/11/2022 and has 8 no. of fills remaining. Notified Pt via: Mychart

## 2022-04-11 ENCOUNTER — Other Ambulatory Visit (HOSPITAL_BASED_OUTPATIENT_CLINIC_OR_DEPARTMENT_OTHER): Payer: Self-pay

## 2022-04-11 ENCOUNTER — Telehealth: Payer: Self-pay

## 2022-04-11 DIAGNOSIS — Z20828 Contact with and (suspected) exposure to other viral communicable diseases: Secondary | ICD-10-CM

## 2022-04-11 MED ORDER — OSELTAMIVIR PHOSPHATE 75 MG PO CAPS
75.0000 mg | ORAL_CAPSULE | Freq: Every day | ORAL | 0 refills | Status: DC
  Filled 2022-04-11: qty 10, 10d supply, fill #0

## 2022-04-11 NOTE — Telephone Encounter (Signed)
[facility] to [facility].

## 2022-04-11 NOTE — Telephone Encounter (Signed)
Kathleen Phillips's husband tested positive for the flu. She is requesting Tamiflu. She has no symptoms.

## 2022-04-11 NOTE — Telephone Encounter (Signed)
Husband tested positive for flu. Requesting tamiflu for prevention. Sent to pharmacy.

## 2022-04-12 NOTE — Telephone Encounter (Signed)
Patient informed. 

## 2022-04-28 ENCOUNTER — Other Ambulatory Visit (HOSPITAL_COMMUNITY): Payer: Self-pay

## 2022-04-28 ENCOUNTER — Other Ambulatory Visit (HOSPITAL_BASED_OUTPATIENT_CLINIC_OR_DEPARTMENT_OTHER): Payer: Self-pay

## 2022-05-01 ENCOUNTER — Other Ambulatory Visit (HOSPITAL_BASED_OUTPATIENT_CLINIC_OR_DEPARTMENT_OTHER): Payer: Self-pay

## 2022-05-02 ENCOUNTER — Other Ambulatory Visit (HOSPITAL_BASED_OUTPATIENT_CLINIC_OR_DEPARTMENT_OTHER): Payer: Self-pay

## 2022-05-03 ENCOUNTER — Ambulatory Visit: Payer: Commercial Managed Care - PPO | Admitting: Physician Assistant

## 2022-05-04 ENCOUNTER — Other Ambulatory Visit (HOSPITAL_BASED_OUTPATIENT_CLINIC_OR_DEPARTMENT_OTHER): Payer: Self-pay

## 2022-05-05 ENCOUNTER — Other Ambulatory Visit (HOSPITAL_BASED_OUTPATIENT_CLINIC_OR_DEPARTMENT_OTHER): Payer: Self-pay

## 2022-05-08 ENCOUNTER — Encounter: Payer: Self-pay | Admitting: Physician Assistant

## 2022-05-09 ENCOUNTER — Other Ambulatory Visit (HOSPITAL_BASED_OUTPATIENT_CLINIC_OR_DEPARTMENT_OTHER): Payer: Self-pay

## 2022-05-10 ENCOUNTER — Other Ambulatory Visit (HOSPITAL_BASED_OUTPATIENT_CLINIC_OR_DEPARTMENT_OTHER): Payer: Self-pay

## 2022-05-11 ENCOUNTER — Other Ambulatory Visit (HOSPITAL_BASED_OUTPATIENT_CLINIC_OR_DEPARTMENT_OTHER): Payer: Self-pay

## 2022-05-12 ENCOUNTER — Encounter: Payer: Self-pay | Admitting: Physician Assistant

## 2022-05-12 ENCOUNTER — Telehealth: Payer: Self-pay

## 2022-05-12 ENCOUNTER — Other Ambulatory Visit (HOSPITAL_BASED_OUTPATIENT_CLINIC_OR_DEPARTMENT_OTHER): Payer: Self-pay

## 2022-05-12 MED ORDER — AIMOVIG 70 MG/ML ~~LOC~~ SOAJ
70.0000 mg | SUBCUTANEOUS | 5 refills | Status: DC
  Filled 2022-05-12: qty 1, 30d supply, fill #0

## 2022-05-12 NOTE — Telephone Encounter (Signed)
Nurtec not covered Failed topamax Maxalt is for rescue

## 2022-05-12 NOTE — Telephone Encounter (Addendum)
Initiated Prior authorization for: Nurtec 75 mg tab Via: Covermymeds Case/Key:[id number] Status: denied  as of 05/12/22 Reason:his drug/product is not covered under the pharmacy benefit. Prior Authorization is not available. Notified Pt via: Mychart

## 2022-05-12 NOTE — Addendum Note (Signed)
Addended by: Donella Stade on: 05/12/2022 04:27 PM   Modules accepted: Orders

## 2022-05-15 ENCOUNTER — Other Ambulatory Visit (HOSPITAL_BASED_OUTPATIENT_CLINIC_OR_DEPARTMENT_OTHER): Payer: Self-pay

## 2022-05-16 ENCOUNTER — Other Ambulatory Visit (HOSPITAL_BASED_OUTPATIENT_CLINIC_OR_DEPARTMENT_OTHER): Payer: Self-pay

## 2022-05-17 ENCOUNTER — Ambulatory Visit: Payer: Commercial Managed Care - PPO | Admitting: Physician Assistant

## 2022-05-17 ENCOUNTER — Other Ambulatory Visit (HOSPITAL_BASED_OUTPATIENT_CLINIC_OR_DEPARTMENT_OTHER): Payer: Self-pay

## 2022-05-31 ENCOUNTER — Encounter: Payer: Self-pay | Admitting: Physician Assistant

## 2022-05-31 ENCOUNTER — Ambulatory Visit (INDEPENDENT_AMBULATORY_CARE_PROVIDER_SITE_OTHER): Payer: Commercial Managed Care - PPO | Admitting: Physician Assistant

## 2022-05-31 ENCOUNTER — Other Ambulatory Visit (HOSPITAL_BASED_OUTPATIENT_CLINIC_OR_DEPARTMENT_OTHER): Payer: Self-pay

## 2022-05-31 ENCOUNTER — Telehealth: Payer: Self-pay

## 2022-05-31 VITALS — BP 126/74 | HR 80 | Ht 69.0 in | Wt 217.0 lb

## 2022-05-31 DIAGNOSIS — E6609 Other obesity due to excess calories: Secondary | ICD-10-CM | POA: Diagnosis not present

## 2022-05-31 DIAGNOSIS — R41840 Attention and concentration deficit: Secondary | ICD-10-CM

## 2022-05-31 DIAGNOSIS — F3181 Bipolar II disorder: Secondary | ICD-10-CM

## 2022-05-31 DIAGNOSIS — Z6832 Body mass index (BMI) 32.0-32.9, adult: Secondary | ICD-10-CM

## 2022-05-31 DIAGNOSIS — F411 Generalized anxiety disorder: Secondary | ICD-10-CM | POA: Diagnosis not present

## 2022-05-31 DIAGNOSIS — F5101 Primary insomnia: Secondary | ICD-10-CM | POA: Diagnosis not present

## 2022-05-31 MED ORDER — ZEPBOUND 5 MG/0.5ML ~~LOC~~ SOAJ
5.0000 mg | SUBCUTANEOUS | 1 refills | Status: DC
  Filled 2022-05-31: qty 2, 28d supply, fill #0

## 2022-05-31 MED ORDER — ZEPBOUND 2.5 MG/0.5ML ~~LOC~~ SOAJ
2.5000 mg | SUBCUTANEOUS | 0 refills | Status: DC
  Filled 2022-05-31: qty 2, 28d supply, fill #0
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
## 2022-05-31 NOTE — Telephone Encounter (Signed)
Initiated Prior authorization [id number] 2.'5MG'$ /0.5ML pen-injectors Via: Covermymeds Case/Key:[id number] Status: Pending as of 05/31/22 Reason: Notified Pt via: Mychart

## 2022-05-31 NOTE — Patient Instructions (Signed)
Start zepbound.  Consider viibryd to trintellix switch  Vortioxetine Tablets What is this medication? Vortioxetine (vor tee [employer] e teen) treats depression. It increases the amount of serotonin in the brain, a substance that helps regulate mood. This medicine may be used for other purposes; ask your health care provider or pharmacist if you have questions. COMMON BRAND NAME(S): BRINTELLIX, Trintellix What should I tell my care team before I take this medication? They need to know if you have any of these conditions: Bipolar disorder or a family history of bipolar disorder Bleeding disorder Glaucoma Liver disease Low levels of sodium in the blood Seizures Suicidal thoughts, plans, or attempt; a previous suicide attempt by you or a family member Take medications that treat or prevent blood clots Taken an MAOI such as Marplan, Nardil, or Parnate in the last 14 days An unusual or allergic reaction to vortioxetine, other medications, foods, dyes, or preservatives Pregnant or trying to get pregnant Breast-feeding How should I use this medication? Take this medication by mouth with water. Take it as directed on the prescription label at the same time every day. You can take it with or without food. If it upsets your stomach, take it with food. Keep taking it unless your care team tells you to stop. A special MedGuide will be given to you by the pharmacist with each prescription and refill. Be sure to read this information carefully each time. Talk to your care team about the use of this medication in children. Special care may be needed. Overdosage: If you think you have taken too much of this medicine contact a poison control center or emergency room at once. NOTE: This medicine is only for you. Do not share this medicine with others. What if I miss a dose? If you miss a dose, take it as soon as you can. If it is almost time for your next dose, take only that dose. Do not take double or extra  doses. What may interact with this medication? Do not take this medication with any of the following: Linezolid MAOIs like Carbex, Eldepryl, Marplan, Nardil, and Parnate Methylene blue (injected into a vein) This medication may also interact with the following: Aspirin and aspirin-like medications Carbamazepine Certain medications for depression, anxiety, or psychotic disturbances Certain medications for migraine headache like almotriptan, eletriptan, frovatriptan, naratriptan, rizatriptan, sumatriptan, zolmitriptan Diuretics Fentanyl Furazolidone Isoniazid Lithium Medications that treat or prevent blood clots like warfarin, enoxaparin, and dalteparin NSAIDs, medications for pain and inflammation, like ibuprofen or naproxen Phenytoin Procarbazine Quinidine Rasagiline Rifampin Safinamide Supplements like St. John's Wort, kava kava, valerian Tramadol Tryptophan This list may not describe all possible interactions. Give your health care provider a list of all the medicines, herbs, non-prescription drugs, or dietary supplements you use. Also tell them if you smoke, drink alcohol, or use illegal drugs. Some items may interact with your medicine. What should I watch for while using this medication? Visit your care team for regular checks on your progress. Tell your care team if your symptoms do not start to get better or if they get worse. Patients and their families should watch out for new or worsening depression or thoughts of suicide. Also watch out for sudden changes in feelings such as feeling anxious, agitated, panicky, irritable, hostile, aggressive, impulsive, severely restless, overly excited and hyperactive, or not being able to sleep. If this happens, especially at the beginning of treatment or after a change in dose, call your care team. You may get drowsy or dizzy. Do  not drive, use machinery, or do anything that needs mental alertness until you know how this medication affects  you. Do not stand or sit up quickly, especially if you are an older patient. This reduces the risk of dizzy or fainting spells. What side effects may I notice from receiving this medication? Side effects that you should report to your care team as soon as possible: Allergic reactions--skin rash, itching, hives, swelling of the face, lips, tongue, or throat Bleeding--bloody or black, tar-like stools, vomiting blood or brown material that looks like coffee grounds, red or dark brown urine, small red or purple spots on skin, unusual bruising or bleeding Irritability, confusion, fast or irregular heartbeat, muscle stiffness, twitching muscles, sweating, high fever, seizure, chills, vomiting, diarrhea, which may be signs of serotonin syndrome Low sodium level--muscle weakness, fatigue, dizziness, headache, confusion Sudden eye pain or change in vision such as blurry vision, seeing halos around lights, vision loss Thoughts of suicide or self-harm, worsening mood, feelings of depression Side effects that usually do not require medical attention (report to your care team if they continue or are bothersome): Change in sex drive or performance Constipation Nausea Vomiting This list may not describe all possible side effects. Call your doctor for medical advice about side effects. You may report side effects to FDA at 1-800-FDA-1088. Where should I keep my medication? Keep out of the reach of children and pets. Store at room temperature between 20 and 25 degrees C (68 and 77 degrees F). Throw away any unused medication after the expiration date. NOTE: This sheet is a summary. It may not cover all possible information. If you have questions about this medicine, talk to your doctor, pharmacist, or health care provider.  2023 Elsevier/Gold Standard (2012-01-04 00:00:00)

## 2022-06-01 ENCOUNTER — Other Ambulatory Visit: Payer: Self-pay | Admitting: Physician Assistant

## 2022-06-01 ENCOUNTER — Other Ambulatory Visit (HOSPITAL_BASED_OUTPATIENT_CLINIC_OR_DEPARTMENT_OTHER): Payer: Self-pay

## 2022-06-01 DIAGNOSIS — E6609 Other obesity due to excess calories: Secondary | ICD-10-CM

## 2022-06-02 ENCOUNTER — Other Ambulatory Visit (HOSPITAL_BASED_OUTPATIENT_CLINIC_OR_DEPARTMENT_OTHER): Payer: Self-pay

## 2022-06-02 ENCOUNTER — Other Ambulatory Visit: Payer: Self-pay

## 2022-06-02 MED ORDER — PHENTERMINE-TOPIRAMATE ER 7.5-46 MG PO CP24
1.0000 | ORAL_CAPSULE | Freq: Every morning | ORAL | 0 refills | Status: DC
  Filled 2022-06-02 – 2022-06-19 (×3): qty 30, 30d supply, fill #0

## 2022-06-02 NOTE — Telephone Encounter (Signed)
The request has been approved. The authorization is effective from 06/01/2022 to 11/28/2022, as long as the member is enrolled in their current health plan.

## 2022-06-02 NOTE — Telephone Encounter (Signed)
Please let pt know and see if sher would like to start it?

## 2022-06-05 ENCOUNTER — Other Ambulatory Visit (HOSPITAL_BASED_OUTPATIENT_CLINIC_OR_DEPARTMENT_OTHER): Payer: Self-pay

## 2022-06-06 ENCOUNTER — Telehealth: Payer: Self-pay

## 2022-06-06 ENCOUNTER — Other Ambulatory Visit (HOSPITAL_BASED_OUTPATIENT_CLINIC_OR_DEPARTMENT_OTHER): Payer: Self-pay

## 2022-06-06 NOTE — Telephone Encounter (Addendum)
Initiated Prior authorization [id number] 7.5-46MG  er capsules Via: Covermymeds Case/Key:[id number]  Status: approved  as of 06/06/22 Reason:This authorization is good from 06/16/22- 09/15/22  Notified Pt via: Mychart

## 2022-06-12 ENCOUNTER — Other Ambulatory Visit (HOSPITAL_BASED_OUTPATIENT_CLINIC_OR_DEPARTMENT_OTHER): Payer: Self-pay

## 2022-06-13 ENCOUNTER — Encounter: Payer: Self-pay | Admitting: Physician Assistant

## 2022-06-13 ENCOUNTER — Other Ambulatory Visit (HOSPITAL_BASED_OUTPATIENT_CLINIC_OR_DEPARTMENT_OTHER): Payer: Self-pay

## 2022-06-14 ENCOUNTER — Other Ambulatory Visit (HOSPITAL_BASED_OUTPATIENT_CLINIC_OR_DEPARTMENT_OTHER): Payer: Self-pay

## 2022-06-15 ENCOUNTER — Other Ambulatory Visit (HOSPITAL_BASED_OUTPATIENT_CLINIC_OR_DEPARTMENT_OTHER): Payer: Self-pay

## 2022-06-16 ENCOUNTER — Other Ambulatory Visit (HOSPITAL_BASED_OUTPATIENT_CLINIC_OR_DEPARTMENT_OTHER): Payer: Self-pay

## 2022-06-19 ENCOUNTER — Other Ambulatory Visit (HOSPITAL_BASED_OUTPATIENT_CLINIC_OR_DEPARTMENT_OTHER): Payer: Self-pay

## 2022-06-19 ENCOUNTER — Other Ambulatory Visit: Payer: Self-pay

## 2022-06-20 ENCOUNTER — Other Ambulatory Visit (HOSPITAL_BASED_OUTPATIENT_CLINIC_OR_DEPARTMENT_OTHER): Payer: Self-pay

## 2022-06-26 ENCOUNTER — Encounter: Payer: Self-pay | Admitting: Nurse Practitioner

## 2022-06-26 ENCOUNTER — Ambulatory Visit (INDEPENDENT_AMBULATORY_CARE_PROVIDER_SITE_OTHER): Payer: Commercial Managed Care - PPO | Admitting: Nurse Practitioner

## 2022-06-26 VITALS — BP 121/81 | HR 70 | Temp 98.4°F | Ht 69.0 in | Wt 214.0 lb

## 2022-06-26 DIAGNOSIS — Z6831 Body mass index (BMI) 31.0-31.9, adult: Secondary | ICD-10-CM | POA: Diagnosis not present

## 2022-06-26 DIAGNOSIS — Z0289 Encounter for other administrative examinations: Secondary | ICD-10-CM

## 2022-06-26 DIAGNOSIS — E669 Obesity, unspecified: Secondary | ICD-10-CM

## 2022-06-26 DIAGNOSIS — E039 Hypothyroidism, unspecified: Secondary | ICD-10-CM | POA: Diagnosis not present

## 2022-06-26 NOTE — Progress Notes (Signed)
Office: [phone]  /  Fax: [phone]   Initial Visit  Kathleen Phillips was seen in clinic today to evaluate for obesity. She is interested in losing weight to improve overall health and reduce the risk of weight related complications. She presents today to review program treatment options, initial physical assessment, and evaluation.     She was referred by: PCP  When asked what else they would like to accomplish? She states: Improve energy levels and physical activity, Improve existing medical conditions, Improve quality of life, and Lose a target amount of weight : Goal weight 160 lbs  Weight history:  She started gaining weight after having her daughter when she was 47 yo.  She lost the weight and got down to 110 lbs. She started gaining excess weight after starting Remeron and other meds.   Some associated conditions: Back pain, anxiety, chronic knee pain, depression, bipolar 2 disorder, insomnia.    Contributing factors: Family history, Medications, and Pregnancy  Weight promoting medications identified: Psychotropic medications  Current nutrition plan: None  Current level of physical activity: None  Current or previous pharmacotherapy: Wegovy, Qsymia, Phentermine, Saxenda.  She started Qsymia 7.5-46mg  today.  Denies history of kidney stones.  Took Topamax in the past for migraines.  She has a uterine ablation and her husband had a vasectomy.    Response to medication: Reports side effects of nausea with [id number]    Past medical history includes:   Past Medical History:  Diagnosis Date   Anxiety    Depression    Migraines    Neurocardiogenic syncope    Thyroid disease    Vaginal Pap smear, abnormal      Objective:   BP 121/81   Pulse 70   Temp 98.4 F (36.9 C)   Ht 5\' 9"  (1.753 m)   Wt 214 lb (97.1 kg)   LMP  (LMP Unknown)   SpO2 100%   BMI 31.60 kg/m  She was weighed on the bioimpedance scale: Body mass index is 31.6 kg/m.  Peak Weight:214 lbs , Body  Fat%:41.1, Visceral Fat Rating:9, Weight trend over the last 12 months: Increasing  General:  Alert, oriented and cooperative. Patient is in no acute distress.  Respiratory: Normal respiratory effort, no problems with respiration noted   Gait: able to ambulate independently  Mental Status: Normal mood and affect. Normal behavior. Normal judgment and thought content.   DIAGNOSTIC DATA REVIEWED:  BMET    Component Value Date/Time   NA 139 11/16/2021 0000   K 4.1 11/16/2021 0000   CL 105 11/16/2021 0000   CO2 30 11/16/2021 0000   GLUCOSE 89 11/16/2021 0000   BUN 10 11/16/2021 0000   CREATININE 0.80 11/16/2021 0000   CALCIUM 8.9 11/16/2021 0000   GFRNONAA 90 08/04/2020 0806   GFRAA 104 08/04/2020 0806   No results found for: "HGBA1C" No results found for: "INSULIN" CBC    Component Value Date/Time   WBC 4.0 11/16/2021 0000   RBC 3.81 11/16/2021 0000   HGB 12.1 11/16/2021 0000   HCT 36.7 11/16/2021 0000   PLT 138 (L) 11/16/2021 0000   MCV 96.3 11/16/2021 0000   MCH 31.8 11/16/2021 0000   MCHC 33.0 11/16/2021 0000   RDW 12.2 11/16/2021 0000   Iron/TIBC/Ferritin/ %Sat No results found for: "IRON", "TIBC", "FERRITIN", "IRONPCTSAT" Lipid Panel     Component Value Date/Time   CHOL 146 11/16/2021 0000   TRIG 73 11/16/2021 0000   HDL 60 11/16/2021 0000   CHOLHDL 2.4 11/16/2021  0000   LDLCALC 71 11/16/2021 0000   Hepatic Function Panel     Component Value Date/Time   PROT 6.2 11/16/2021 0000   AST 18 11/16/2021 0000   ALT 11 11/16/2021 0000   BILITOT 0.7 11/16/2021 0000      Component Value Date/Time   TSH 2.56 11/16/2021 0000     Assessment and Plan:   Hypothyroidism, unspecified type Continue to follow-up with PCP.  Continue medications as directed.  Generalized obesity  BMI 31.0-31.9,adult   Currently taking Qsymia prescribed by PCP.       Obesity Treatment / Action Plan:  Patient will work on garnering support from family and friends to begin  weight loss journey. Will work on eliminating or reducing the presence of highly palatable, calorie dense foods in the home. Will complete provided nutritional and psychosocial assessment questionnaire before the next appointment. Will be scheduled for indirect calorimetry to determine resting energy expenditure in a fasting state.  This will allow [place] to create a reduced calorie, high-protein meal plan to promote loss of fat mass while preserving muscle mass.  Obesity Education Performed Today:  She was weighed on the bioimpedance scale and results were discussed and documented in the synopsis.  We discussed obesity as a disease and the importance of a more detailed evaluation of all the factors contributing to the disease.  We discussed the importance of long term lifestyle changes which include nutrition, exercise and behavioral modifications as well as the importance of customizing this to her specific health and social needs.  We discussed the benefits of reaching a healthier weight to alleviate the symptoms of existing conditions and reduce the risks of the biomechanical, metabolic and psychological effects of obesity.  Kathleen Phillips appears to be in the action stage of change and states they are ready to start intensive lifestyle modifications and behavioral modifications.  30 minutes was spent today on this visit including the above counseling, pre-visit chart review, and post-visit documentation.  Reviewed by clinician on day of visit: allergies, medications, problem list, medical history, surgical history, family history, social history, and previous encounter notes pertinent to obesity diagnosis.    Ailene Rud Carinna Newhart FNP-C

## 2022-07-05 ENCOUNTER — Other Ambulatory Visit (HOSPITAL_BASED_OUTPATIENT_CLINIC_OR_DEPARTMENT_OTHER): Payer: Self-pay

## 2022-07-05 ENCOUNTER — Encounter: Payer: Self-pay | Admitting: Physician Assistant

## 2022-07-05 MED ORDER — LEVOCETIRIZINE DIHYDROCHLORIDE 5 MG PO TABS
ORAL_TABLET | Freq: Every evening | ORAL | 3 refills | Status: DC
  Filled 2022-07-05: qty 90, 90d supply, fill #0
  Filled 2023-01-24: qty 90, 90d supply, fill #1
  Filled 2023-04-30: qty 90, 90d supply, fill #2

## 2022-07-17 ENCOUNTER — Ambulatory Visit: Payer: Commercial Managed Care - PPO | Admitting: Bariatrics

## 2022-07-18 ENCOUNTER — Other Ambulatory Visit (HOSPITAL_BASED_OUTPATIENT_CLINIC_OR_DEPARTMENT_OTHER): Payer: Self-pay

## 2022-07-31 ENCOUNTER — Ambulatory Visit: Payer: Commercial Managed Care - PPO | Admitting: Bariatrics

## 2022-08-14 ENCOUNTER — Encounter: Payer: Self-pay | Admitting: Bariatrics

## 2022-08-14 ENCOUNTER — Ambulatory Visit (INDEPENDENT_AMBULATORY_CARE_PROVIDER_SITE_OTHER): Payer: Commercial Managed Care - PPO | Admitting: Bariatrics

## 2022-08-14 ENCOUNTER — Other Ambulatory Visit (HOSPITAL_BASED_OUTPATIENT_CLINIC_OR_DEPARTMENT_OTHER): Payer: Self-pay

## 2022-08-14 VITALS — BP 107/70 | HR 70 | Temp 98.3°F | Ht 69.0 in | Wt 216.0 lb

## 2022-08-14 DIAGNOSIS — R0602 Shortness of breath: Secondary | ICD-10-CM | POA: Diagnosis not present

## 2022-08-14 DIAGNOSIS — Z6831 Body mass index (BMI) 31.0-31.9, adult: Secondary | ICD-10-CM | POA: Diagnosis not present

## 2022-08-14 DIAGNOSIS — F32A Depression, unspecified: Secondary | ICD-10-CM | POA: Diagnosis not present

## 2022-08-14 DIAGNOSIS — E038 Other specified hypothyroidism: Secondary | ICD-10-CM

## 2022-08-14 DIAGNOSIS — E039 Hypothyroidism, unspecified: Secondary | ICD-10-CM | POA: Insufficient documentation

## 2022-08-14 DIAGNOSIS — Z636 Dependent relative needing care at home: Secondary | ICD-10-CM

## 2022-08-14 DIAGNOSIS — D649 Anemia, unspecified: Secondary | ICD-10-CM | POA: Insufficient documentation

## 2022-08-14 DIAGNOSIS — Z0000 Encounter for general adult medical examination without abnormal findings: Secondary | ICD-10-CM | POA: Insufficient documentation

## 2022-08-14 DIAGNOSIS — Z1331 Encounter for screening for depression: Secondary | ICD-10-CM

## 2022-08-14 DIAGNOSIS — F5089 Other specified eating disorder: Secondary | ICD-10-CM | POA: Diagnosis not present

## 2022-08-14 DIAGNOSIS — E669 Obesity, unspecified: Secondary | ICD-10-CM

## 2022-08-14 DIAGNOSIS — R5383 Other fatigue: Secondary | ICD-10-CM | POA: Diagnosis not present

## 2022-08-14 DIAGNOSIS — D508 Other iron deficiency anemias: Secondary | ICD-10-CM

## 2022-08-14 DIAGNOSIS — F509 Eating disorder, unspecified: Secondary | ICD-10-CM

## 2022-08-14 DIAGNOSIS — R0609 Other forms of dyspnea: Secondary | ICD-10-CM | POA: Insufficient documentation

## 2022-08-14 HISTORY — DX: Shortness of breath: R06.02

## 2022-08-14 HISTORY — DX: Eating disorder, unspecified: F50.9

## 2022-08-14 HISTORY — DX: Body mass index (BMI) 31.0-31.9, adult: Z68.31

## 2022-08-15 LAB — TSH+T4F+T3FREE
Free T4: 0.95 ng/dL (ref 0.82–1.77)
T3, Free: 2.3 pg/mL (ref 2.0–4.4)
TSH: 6.17 u[IU]/mL — ABNORMAL HIGH (ref 0.450–4.500)

## 2022-08-15 LAB — ANEMIA PANEL
Ferritin: 15 ng/mL (ref 15–150)
Folate, Hemolysate: 452 ng/mL
Folate, RBC: 1119 ng/mL (ref >498)
Hematocrit: 40.4 % (ref 34.0–46.6)
Iron Saturation: 29 % (ref 15–55)
Iron: 96 ug/dL (ref 27–159)
Retic Ct Pct: 1.1 % (ref 0.6–2.6)
Total Iron Binding Capacity: 336 ug/dL (ref 250–450)
UIBC: 240 ug/dL (ref 131–425)
Vitamin B-12: 356 pg/mL (ref 232–1245)

## 2022-08-15 LAB — LIPID PANEL WITH LDL/HDL RATIO
Cholesterol, Total: 147 mg/dL (ref 100–199)
HDL: 50 mg/dL (ref >39)
LDL Chol Calc (NIH): 77 mg/dL (ref 0–99)
LDL/HDL Ratio: 1.5 ratio (ref 0.0–3.2)
Triglycerides: 111 mg/dL (ref 0–149)
VLDL Cholesterol Cal: 20 mg/dL (ref 5–40)

## 2022-08-15 LAB — VITAMIN D 25 HYDROXY (VIT D DEFICIENCY, FRACTURES): Vit D, 25-Hydroxy: 27.5 ng/mL — ABNORMAL LOW (ref 30.0–100.0)

## 2022-08-15 LAB — HEMOGLOBIN A1C
Est. average glucose Bld gHb Est-mCnc: 103 mg/dL
Hgb A1c MFr Bld: 5.2 % (ref 4.8–5.6)

## 2022-08-15 LAB — INSULIN, RANDOM: INSULIN: 11.2 u[IU]/mL (ref 2.6–24.9)

## 2022-08-15 NOTE — Progress Notes (Unsigned)
Chief Complaint:   OBESITY Kathleen Phillips (MR# [id number]) is a 47 y.o. female who presents for evaluation and treatment of obesity and related comorbidities. Current BMI is Body mass index is 31.9 kg/m. Amberlin has been struggling with her weight for many years and has been unsuccessful in either losing weight, maintaining weight loss, or reaching her healthy weight goal.  Lorana is currently in the action stage of change and ready to dedicate time achieving and maintaining a healthier weight. Celsey is interested in becoming our patient and working on intensive lifestyle modifications including (but not limited to) diet and exercise for weight loss.  Mckenze met with Judeth Cornfield on 06/26/2022.  She states that she likes to cook, but she does not feel like she is good at it.  She states that she has binge eating behaviors.  Landi's habits were reviewed today and are as follows: Her family eats meals together, she thinks her family will eat healthier with her, she struggles with family and or coworkers weight loss sabotage, her desired weight loss is 56 lbs, she started gaining weight in her mid 20's, her heaviest weight ever was 214 pounds, she has significant food cravings issues, she snacks frequently in the evenings, she is frequently drinking liquids with calories, she frequently makes poor food choices, she frequently eats larger portions than normal, she has binge eating behaviors, and she struggles with emotional eating.  Depression Screen Dorraine's Food and Mood (modified PHQ-9) score was 22.  Subjective:   1. Other fatigue Latyra admits to daytime somnolence and admits to waking up still tired. Patient has a history of symptoms of daytime fatigue, morning fatigue, and morning headache. Josceline generally gets 6 hours of sleep per night, and states that she has nightime awakenings. Snoring is present. Apneic episodes are present. Epworth Sleepiness Score is 7.   2.  SOB (shortness of breath) on exertion Nalea notes increasing shortness of breath with exercising and seems to be worsening over time with weight gain. She notes getting out of breath sooner with activity than she used to. This has not gotten worse recently. Raquelle denies shortness of breath at rest or orthopnea.  3. Other specified hypothyroidism Zuriel is taking levothyroxine.  4. Caregiver stress Syvanna's husband has neuromuscular and cirrhosis.  5. Health care maintenance Given obesity.   6. Other iron deficiency anemia Camren is not on iron infusions.  7. Other disorder of eating Orit had taking Qsymia 7.5-46 mg.  She notes boredom eating behavior.  She is not on Qsymia or any medications at this time.  Assessment/Plan:   1. Other fatigue Alyssha does feel that her weight is causing her energy to be lower than it should be. Fatigue may be related to obesity, depression or many other causes. Labs will be ordered, and in the meanwhile, Conchita will focus on self care including making healthy food choices, increasing physical activity and focusing on stress reduction.  - EKG 12-Lead - TSH+T4F+T3Free - Ferritin  2. SOB (shortness of breath) on exertion Korine does feel that she gets out of breath more easily that she used to when she exercises. Marie's shortness of breath appears to be obesity related and exercise induced. She has agreed to work on weight loss and gradually increase exercise to treat her exercise induced shortness of breath. Will continue to monitor closely.  - TSH+T4F+T3Free - Ferritin  3. Other specified hypothyroidism We will check labs today, and Juliah will continue her medications as directed.  -  TSH+T4F+T3Free  4. Caregiver stress Discussed stress reduction strategies with the patient.  5. Health care maintenance We will check labs today. EKG and IC were done today, and results were reviewed with the patient.   - Lipid Panel  With LDL/HDL Ratio - Insulin, random - Hemoglobin A1c - TSH+T4F+T3Free - VITAMIN D 25 Hydroxy (Vit-D Deficiency, Fractures) - Anemia panel - Ferritin  6. Other iron deficiency anemia We will check labs today, we will follow-up at Josclyn's next visit.  - Anemia panel - Ferritin  7. Other disorder of eating We will follow-up at her next visit, and patient was referred to Dr. Dewaine Conger, our Bariatric Psychologist, for evaluation.  8. Depression screening Ranika had a positive depression screening. Depression is commonly associated with obesity and often results in emotional eating behaviors. We will monitor this closely and work on CBT to help improve the non-hunger eating patterns. Referral to Psychology may be required if no improvement is seen as she continues in our clinic.  9. Generalized obesity - Lipid Panel With LDL/HDL Ratio - Insulin, random - Hemoglobin A1c - TSH+T4F+T3Free - VITAMIN D 25 Hydroxy (Vit-D Deficiency, Fractures) - Anemia panel - Ferritin  10. BMI 31.0-31.9,adult We will check labs today, and we will follow-up at Edlin's next visit.   - Lipid Panel With LDL/HDL Ratio - Insulin, random - Hemoglobin A1c - TSH+T4F+T3Free - VITAMIN D 25 Hydroxy (Vit-D Deficiency, Fractures) - Anemia panel - Ferritin  Kimber is currently in the action stage of change and her goal is to continue with weight loss efforts. I recommend Legend begin the structured treatment plan as follows:  She has agreed to the Category 2 Plan.  Meal planning was discussed.  Review labs with the patient from 11/16/2021, CMP, lipids, ALK Phos, CBC, glucose, and TSH.  No avocado or berries.  Exercise goals: No exercise has been prescribed at this time.   Behavioral modification strategies: increasing lean protein intake, decreasing simple carbohydrates, increasing vegetables, increasing water intake, decreasing eating out, no skipping meals, meal planning and cooking strategies,  keeping healthy foods in the home, and planning for success.  She was informed of the importance of frequent follow-up visits to maximize her success with intensive lifestyle modifications for her multiple health conditions. She was informed we would discuss her lab results at her next visit unless there is a critical issue that needs to be addressed sooner. Sahily agreed to keep her next visit at the agreed upon time to discuss these results.  Objective:   Blood pressure 107/70, pulse 70, temperature 98.3 F (36.8 C), height 5\' 9"  (1.753 m), weight 216 lb (98 kg), SpO2 100 %. Body mass index is 31.9 kg/m.  EKG: Normal sinus rhythm, rate 80 BPM.  Indirect Calorimeter completed today shows a VO2 of 255 and a REE of 1757.  Her calculated basal metabolic rate is 6578 thus her basal metabolic rate is better than expected.  General: Cooperative, alert, well developed, in no acute distress. HEENT: Conjunctivae and lids unremarkable. Cardiovascular: Regular rhythm.  Lungs: Normal work of breathing. Neurologic: No focal deficits.   Lab Results  Component Value Date   CREATININE 0.80 11/16/2021   BUN 10 11/16/2021   NA 139 11/16/2021   K 4.1 11/16/2021   CL 105 11/16/2021   CO2 30 11/16/2021   Lab Results  Component Value Date   ALT 11 11/16/2021   AST 18 11/16/2021   BILITOT 0.7 11/16/2021   Lab Results  Component Value Date   HGBA1C  5.2 08/14/2022   Lab Results  Component Value Date   INSULIN 11.2 08/14/2022   Lab Results  Component Value Date   TSH 6.170 (H) 08/14/2022   Lab Results  Component Value Date   CHOL 147 08/14/2022   HDL 50 08/14/2022   LDLCALC 77 08/14/2022   TRIG 111 08/14/2022   CHOLHDL 2.4 11/16/2021   Lab Results  Component Value Date   WBC 4.0 11/16/2021   HGB 12.1 11/16/2021   HCT 40.4 08/14/2022   MCV 96.3 11/16/2021   PLT 138 (L) 11/16/2021   Lab Results  Component Value Date   IRON 96 08/14/2022   TIBC 336 08/14/2022   FERRITIN 15  08/14/2022   Attestation Statements:   Reviewed by clinician on day of visit: allergies, medications, problem list, medical history, surgical history, family history, social history, and previous encounter notes.   Trude Mcburney, am acting as Energy manager for [employer], DO.  I have reviewed the above documentation for accuracy and completeness, and I agree with the above. - ***

## 2022-08-16 ENCOUNTER — Encounter: Payer: Self-pay | Admitting: Physician Assistant

## 2022-08-16 ENCOUNTER — Encounter: Payer: Self-pay | Admitting: Bariatrics

## 2022-08-16 ENCOUNTER — Other Ambulatory Visit (HOSPITAL_BASED_OUTPATIENT_CLINIC_OR_DEPARTMENT_OTHER): Payer: Self-pay

## 2022-08-16 MED ORDER — LEVOTHYROXINE SODIUM 75 MCG PO TABS
75.0000 ug | ORAL_TABLET | Freq: Every day | ORAL | 1 refills | Status: DC
  Filled 2022-08-16: qty 30, 30d supply, fill #0
  Filled 2022-09-19: qty 30, 30d supply, fill #1

## 2022-08-17 ENCOUNTER — Encounter: Payer: Self-pay | Admitting: Bariatrics

## 2022-08-18 ENCOUNTER — Emergency Department (HOSPITAL_BASED_OUTPATIENT_CLINIC_OR_DEPARTMENT_OTHER)
Admission: EM | Admit: 2022-08-18 | Discharge: 2022-08-18 | Disposition: A | Discharge status: Home / Self Care | Payer: Commercial Managed Care - PPO | Attending: Emergency Medicine | Admitting: Emergency Medicine

## 2022-08-18 ENCOUNTER — Other Ambulatory Visit (HOSPITAL_BASED_OUTPATIENT_CLINIC_OR_DEPARTMENT_OTHER): Payer: Self-pay

## 2022-08-18 ENCOUNTER — Other Ambulatory Visit: Payer: Self-pay

## 2022-08-18 ENCOUNTER — Encounter (HOSPITAL_BASED_OUTPATIENT_CLINIC_OR_DEPARTMENT_OTHER): Payer: Self-pay

## 2022-08-18 DIAGNOSIS — N390 Urinary tract infection, site not specified: Secondary | ICD-10-CM | POA: Diagnosis not present

## 2022-08-18 DIAGNOSIS — R300 Dysuria: Secondary | ICD-10-CM | POA: Diagnosis present

## 2022-08-18 LAB — URINALYSIS, ROUTINE W REFLEX MICROSCOPIC
Bilirubin Urine: NEGATIVE
Glucose, UA: NEGATIVE mg/dL
Ketones, ur: NEGATIVE mg/dL
Nitrite: NEGATIVE
Protein, ur: 30 mg/dL — AB
Specific Gravity, Urine: 1.01 (ref 1.005–1.030)
pH: 6 (ref 5.0–8.0)

## 2022-08-18 LAB — URINALYSIS, MICROSCOPIC (REFLEX)

## 2022-08-18 LAB — PREGNANCY, URINE: Preg Test, Ur: NEGATIVE

## 2022-08-18 MED ORDER — CEPHALEXIN 500 MG PO CAPS
500.0000 mg | ORAL_CAPSULE | Freq: Three times a day (TID) | ORAL | 0 refills | Status: AC
  Filled 2022-08-18: qty 21, 7d supply, fill #0

## 2022-08-18 NOTE — ED Provider Notes (Signed)
[place] [facility] AT [facility] Provider Note   CSN: [id number] Arrival date & time: 08/18/22  1246     History  Chief Complaint  Patient presents with   Dysuria    Kathleen Phillips is a 47 y.o. female.  Patient complains of burning with urination.  Patient thinks she may have a urinary tract infection.  Patient has had urinary tract infections in the past.  Patient denies any back pain she denies any abdominal pain she has not any nausea or vomiting  The history is provided by the patient. No language interpreter was used.  Dysuria Pain quality:  Aching Relieved by:  Nothing Worsened by:  Nothing Ineffective treatments:  None tried Urinary symptoms: discolored urine        Home Medications Prior to Admission medications   Medication Sig Start Date End Date Taking? Authorizing Provider  cephALEXin (KEFLEX) 500 MG capsule Take 1 capsule (500 mg total) by mouth 3 (three) times daily for 10 days. 08/18/22 08/28/22 Yes Elson Areas, PA-C  cariprazine (VRAYLAR) 3 MG capsule Take 1 capsule (3 mg total) by mouth daily. 02/28/22   Charlton Amor, DO  Cholecalciferol (VITAMIN D) 50 MCG (2000 UT) tablet Take 2,000 Units by mouth daily.    [provider]  EPINEPHrine 0.3 mg/0.3 mL IJ SOAJ injection Inject 0.3 mg into the muscle as needed for anaphylaxis. 12/15/20   Breeback, Jade L, PA-C  Erenumab-aooe (AIMOVIG) 70 MG/ML SOAJ Inject 70 mg into the skin every month for migraine prevention. 05/12/22   Breeback, Jade L, PA-C  levocetirizine (XYZAL) 5 MG tablet TAKE 1 TABLET (5 MG TOTAL) BY MOUTH EVERY EVENING. 07/05/22   Breeback, Jade L, PA-C  levothyroxine (SYNTHROID) 75 MCG tablet Take 1 tablet (75 mcg total) by mouth daily. 08/16/22   Breeback, Lonna Cobb, PA-C  meclizine (ANTIVERT) 25 MG tablet Take 1 tablet (25 mg total) by mouth 3 (three) times daily as needed for dizziness. 03/20/22   Christen Butter, NP  meloxicam (MOBIC) 15 MG tablet Take 1 tablet (15 mg  total) by mouth daily. 03/01/22   Breeback, Lonna Cobb, PA-C  Multiple Vitamins-Minerals (MULTIVITAMIN WITH MINERALS) tablet Take 1 tablet by mouth daily.    [provider]  ondansetron (ZOFRAN-ODT) 8 MG disintegrating tablet Take 1 tablet (8 mg total) by mouth every 8 (eight) hours as needed for nausea. 03/20/22   Christen Butter, NP  Phentermine-Topiramate 7.5-46 MG CP24 Take 1 capsule by mouth every morning. 06/02/22   Breeback, Jade L, PA-C  rizatriptan (MAXALT) 10 MG tablet TAKE 1 TABLET (10 MG TOTAL) BY MOUTH AS NEEDED FOR MIGRAINE. MAY REPEAT IN 2 HOURS IF NEEDED 10/17/21 10/17/22  Breeback, Jade L, PA-C  traMADol (ULTRAM) 50 MG tablet Take 1-2 tablets (50-100 mg total) by mouth every 8 (eight) hours as needed for moderate pain. Maximum 6 tabs per day. 02/08/21   Monica Becton, MD  valACYclovir (VALTREX) 500 MG tablet Take one tablet every 12 hours for 3 days for outbreaks. 07/02/20   Breeback, Lonna Cobb, PA-C  Vilazodone HCl (VIIBRYD) 40 MG TABS Take 1 tablet (40 mg total) by mouth daily. 03/16/22 03/16/23  Jomarie Longs, PA-C      Allergies    Bupropion, Avocado, Ibuprofen, Other, and Tape    Review of Systems   Review of Systems  Genitourinary:  Positive for dysuria.  All other systems reviewed and are negative.   Physical Exam Updated Vital Signs BP 111/82 (BP Location: Left  Arm)   Pulse 79   Temp 98.3 F (36.8 C) (Oral)   Resp 18   Ht 5\' 8"  (1.727 m)   Wt 98.4 kg   SpO2 99%   BMI 32.99 kg/m  Physical Exam Vitals and nursing note reviewed.  Constitutional:      Appearance: She is well-developed.  HENT:     Head: Normocephalic.  Pulmonary:     Effort: Pulmonary effort is normal.  Abdominal:     General: There is no distension.  Musculoskeletal:        General: Normal range of motion.     Cervical back: Normal range of motion.  Neurological:     Mental Status: She is alert and oriented to person, place, and time.  Psychiatric:        Mood and Affect: Mood  normal.     ED Results / Procedures / Treatments   Labs (all labs ordered are listed, but only abnormal results are displayed) Labs Reviewed  URINALYSIS, ROUTINE W REFLEX MICROSCOPIC - Abnormal; Notable for the following components:      Result Value   APPearance CLOUDY (*)    Hgb urine dipstick LARGE (*)    Protein, ur 30 (*)    Leukocytes,Ua MODERATE (*)    All other components within normal limits  URINALYSIS, MICROSCOPIC (REFLEX) - Abnormal; Notable for the following components:   Bacteria, UA FEW (*)    All other components within normal limits  PREGNANCY, URINE    EKG None  Radiology No results found.  Procedures Procedures    Medications Ordered in ED Medications - No data to display  ED Course/ Medical Decision Making/ A&P                             Medical Decision Making Patient complains of burning with urination  Amount and/or Complexity of Data Reviewed Labs: ordered. Decision-making details documented in ED Course.    Details: UA shows urged blood moderate leukocytes  Risk Prescription drug management. Risk Details: Patient counseled on urinary tract infections.  Patient given a prescription for Keflex she is advised to follow-up with her primary care physician if symptoms persist           Final Clinical Impression(s) / ED Diagnoses Final diagnoses:  Acute UTI (urinary tract infection)    Rx / DC Orders ED Discharge Orders          Ordered    cephALEXin (KEFLEX) 500 MG capsule  3 times daily        08/18/22 1432           An After Visit Summary was printed and given to the patient.    Elson Areas, [place] 08/18/22 1434    Pricilla Loveless, MD 08/18/22 [phone]

## 2022-08-18 NOTE — ED Triage Notes (Signed)
Patient has had burning and blood in her urine for two days.

## 2022-08-21 ENCOUNTER — Telehealth: Payer: Self-pay | Admitting: General Practice

## 2022-08-21 NOTE — Transitions of Care (Post Inpatient/ED Visit) (Signed)
08/21/2022  Name: Kathleen Phillips MRN: [id number] DOB: Aug 02, 1975  Today's TOC FU Call Status: Today's TOC FU Call Status:: Successful TOC FU Call Competed TOC FU Call Complete Date: 08/21/22  Transition Care Management Follow-up Telephone Call Date of Discharge: 08/18/22 Discharge Facility: [facility] Type of Discharge: Emergency Department Reason for ED Visit: Renal Renal Diagnosis:  (UTI) How have you been since you were released from the hospital?: Better Any questions or concerns?: No  Items Reviewed: Did you receive and understand the discharge instructions provided?: Yes Medications obtained,verified, and reconciled?: Yes (Medications Reviewed) Any new allergies since your discharge?: No Dietary orders reviewed?: NA Do you have support at home?: Yes  Medications Reviewed Today: Medications Reviewed Today     Reviewed by Modesto Charon, RN (Registered Nurse) on 08/21/22 at 1047  Med List Status: <None>   Medication Order Taking? Sig Documenting Provider Last Dose Status Informant  cariprazine (VRAYLAR) 3 MG capsule [id number] No Take 1 capsule (3 mg total) by mouth daily. Charlton Amor, DO Taking Active   cephALEXin (KEFLEX) 500 MG capsule [id number]  Take 1 capsule (500 mg total) by mouth 3 (three) times daily for 10 days. Elson Areas, PA-C  Active   Cholecalciferol (VITAMIN D) 50 MCG (2000 UT) tablet [id number] No Take 2,000 Units by mouth daily. [provider] Taking Active   EPINEPHrine 0.3 mg/0.3 mL IJ SOAJ injection [id number] No Inject 0.3 mg into the muscle as needed for anaphylaxis. Jomarie Longs, PA-C Taking Active   Erenumab-aooe (AIMOVIG) 70 MG/ML SOAJ [id number] No Inject 70 mg into the skin every month for migraine prevention. Jomarie Longs, PA-C Taking Active   levocetirizine (XYZAL) 5 MG tablet [id number] No TAKE 1 TABLET (5 MG TOTAL) BY MOUTH EVERY EVENING. Jomarie Longs, PA-C Taking Active   levothyroxine (SYNTHROID) 75 MCG  tablet [id number]  Take 1 tablet (75 mcg total) by mouth daily. Jomarie Longs, PA-C  Active   meclizine (ANTIVERT) 25 MG tablet [id number] No Take 1 tablet (25 mg total) by mouth 3 (three) times daily as needed for dizziness. Christen Butter, NP Taking Active   meloxicam (MOBIC) 15 MG tablet [id number] No Take 1 tablet (15 mg total) by mouth daily. Jomarie Longs, PA-C Taking Active   Multiple Vitamins-Minerals (MULTIVITAMIN WITH MINERALS) tablet [id number] No Take 1 tablet by mouth daily. [provider] Taking Active   ondansetron (ZOFRAN-ODT) 8 MG disintegrating tablet [id number] No Take 1 tablet (8 mg total) by mouth every 8 (eight) hours as needed for nausea. Christen Butter, NP Taking Active   Phentermine-Topiramate 7.5-46 MG CP24 [id number] No Take 1 capsule by mouth every morning. Jomarie Longs, PA-C Taking Active   rizatriptan (MAXALT) 10 MG tablet [id number] No TAKE 1 TABLET (10 MG TOTAL) BY MOUTH AS NEEDED FOR MIGRAINE. MAY REPEAT IN 2 HOURS IF NEEDED Breeback, Jade L, PA-C Taking Active   traMADol (ULTRAM) 50 MG tablet [id number] No Take 1-2 tablets (50-100 mg total) by mouth every 8 (eight) hours as needed for moderate pain. Maximum 6 tabs per day. Monica Becton, MD Taking Active   valACYclovir (VALTREX) 500 MG tablet [id number] No Take one tablet every 12 hours for 3 days for outbreaks. Jomarie Longs, PA-C Taking Active   Vilazodone HCl (VIIBRYD) 40 MG TABS [id number] No Take 1 tablet (40 mg total) by mouth daily. Jomarie Longs, PA-C Taking Active             Home Care and Equipment/Supplies:  Were Home Health Services Ordered?: NA Any new equipment or medical supplies ordered?: NA  Functional Questionnaire: Do you need assistance with bathing/showering or dressing?: No Do you need assistance with meal preparation?: No Do you need assistance with eating?: No Do you have difficulty maintaining continence: No Do you need assistance with getting out of bed/getting  out of a chair/moving?: No Do you have difficulty managing or taking your medications?: No  Follow up appointments reviewed: PCP Follow-up appointment confirmed?: NA Specialist Hospital Follow-up appointment confirmed?: NA Do you need transportation to your follow-up appointment?: No Do you understand care options if your condition(s) worsen?: Yes-patient verbalized understanding    SIGNATURE Modesto Charon, RN BSN

## 2022-08-23 ENCOUNTER — Encounter (INDEPENDENT_AMBULATORY_CARE_PROVIDER_SITE_OTHER): Payer: Self-pay | Admitting: Bariatrics

## 2022-08-23 DIAGNOSIS — E559 Vitamin D deficiency, unspecified: Secondary | ICD-10-CM

## 2022-08-23 HISTORY — DX: Vitamin D deficiency, unspecified: E55.9

## 2022-08-25 ENCOUNTER — Other Ambulatory Visit: Payer: Self-pay | Admitting: Urology

## 2022-08-25 ENCOUNTER — Other Ambulatory Visit (HOSPITAL_BASED_OUTPATIENT_CLINIC_OR_DEPARTMENT_OTHER): Payer: Self-pay

## 2022-08-25 DIAGNOSIS — R300 Dysuria: Secondary | ICD-10-CM

## 2022-08-25 LAB — URINALYSIS, ROUTINE W REFLEX MICROSCOPIC
Bilirubin, UA: NEGATIVE
Glucose, UA: NEGATIVE
Ketones, UA: NEGATIVE
Leukocytes,UA: NEGATIVE
Nitrite, UA: NEGATIVE
Protein,UA: NEGATIVE
RBC, UA: NEGATIVE
Specific Gravity, UA: 1.01 (ref 1.005–1.030)
Urobilinogen, Ur: 0.2 mg/dL (ref 0.2–1.0)
pH, UA: 6.5 (ref 5.0–7.5)

## 2022-08-25 MED ORDER — NITROFURANTOIN MONOHYD MACRO 100 MG PO CAPS
100.0000 mg | ORAL_CAPSULE | Freq: Two times a day (BID) | ORAL | 0 refills | Status: DC
  Filled 2022-08-25: qty 14, 7d supply, fill #0

## 2022-08-29 ENCOUNTER — Encounter: Payer: Self-pay | Admitting: Bariatrics

## 2022-08-29 ENCOUNTER — Other Ambulatory Visit (HOSPITAL_BASED_OUTPATIENT_CLINIC_OR_DEPARTMENT_OTHER): Payer: Self-pay

## 2022-08-29 ENCOUNTER — Ambulatory Visit: Payer: Commercial Managed Care - PPO | Admitting: Bariatrics

## 2022-08-29 VITALS — BP 107/70 | HR 85 | Temp 98.0°F | Ht 69.0 in | Wt 221.0 lb

## 2022-08-29 DIAGNOSIS — R790 Abnormal level of blood mineral: Secondary | ICD-10-CM | POA: Diagnosis not present

## 2022-08-29 DIAGNOSIS — Z6832 Body mass index (BMI) 32.0-32.9, adult: Secondary | ICD-10-CM | POA: Insufficient documentation

## 2022-08-29 DIAGNOSIS — E559 Vitamin D deficiency, unspecified: Secondary | ICD-10-CM | POA: Diagnosis not present

## 2022-08-29 DIAGNOSIS — R7989 Other specified abnormal findings of blood chemistry: Secondary | ICD-10-CM

## 2022-08-29 DIAGNOSIS — F5089 Other specified eating disorder: Secondary | ICD-10-CM

## 2022-08-29 DIAGNOSIS — E669 Obesity, unspecified: Secondary | ICD-10-CM

## 2022-08-29 DIAGNOSIS — E88819 Insulin resistance, unspecified: Secondary | ICD-10-CM | POA: Insufficient documentation

## 2022-08-29 HISTORY — DX: Insulin resistance, unspecified: E88.819

## 2022-08-29 HISTORY — DX: Other specified abnormal findings of blood chemistry: R79.89

## 2022-08-29 HISTORY — DX: Body mass index (BMI) 32.0-32.9, adult: Z68.32

## 2022-08-29 MED ORDER — VITAMIN D (ERGOCALCIFEROL) 1.25 MG (50000 UNIT) PO CAPS
50000.0000 [IU] | ORAL_CAPSULE | ORAL | 0 refills | Status: DC
Start: 2022-08-29 — End: 2023-05-25
  Filled 2022-08-29: qty 5, 35d supply, fill #0

## 2022-08-29 MED ORDER — QSYMIA 7.5-46 MG PO CP24
1.0000 | ORAL_CAPSULE | Freq: Every morning | ORAL | 0 refills | Status: DC
Start: 2022-08-29 — End: 2023-05-25
  Filled 2022-08-29: qty 30, 30d supply, fill #0

## 2022-08-30 ENCOUNTER — Telehealth: Payer: Self-pay

## 2022-08-30 NOTE — Progress Notes (Signed)
Chief Complaint:   OBESITY Kathleen Phillips is here to discuss her progress with her obesity treatment plan along with follow-up of her obesity related diagnoses. Kathleen Phillips is on the Category 2 Plan and states she is following her eating plan approximately 50% of the time. Kathleen Phillips states she is walking the dog for 30 minutes 7 times per week.  Today's visit was #: 2 Starting weight: 216 lbs Starting date: 08/14/2022 Today's weight: 221 lbs Today's date: 08/29/2022 Total lbs lost to date: 0 Total lbs lost since last in-office visit: 0  Interim History: Patient is up 5 pounds since her first visit.  She is getting protein in the morning.  Subjective:   1. Low ferritin Patient's ferritin is low.  2. Elevated TSH Patient is taking Synthroid.  Her PCP increased her dose.  3. Insulin resistance Patient is not on medication.  4. Vitamin D deficiency Patient is taking vitamin D.  5. Other disorder of eating Patient notes boredom eating and stress eating.  PDMP was checked and information consent form signed.  Assessment/Plan:   1. Low ferritin Patient will begin iron OTC.  2. Elevated TSH Patient will continue her medication as directed.  3. Insulin resistance Handouts on insulin resistance and prediabetes were given to the patient today.  4. Vitamin D deficiency Patient will continue prescription vitamin D, we will refill for 1 month.  - Vitamin D, Ergocalciferol, (DRISDOL) 1.25 MG (50000 UNIT) CAPS capsule; Take 1 capsule (50,000 Units total) by mouth every 7 (seven) days.  Dispense: 5 capsule; Refill: 0  5. Other disorder of eating Patient agreed to start Qsymia 7.5-46 mg once daily with no refills.  6. Generalized obesity  7. BMI 32.0-32.9,adult Patient agreed to start Qsymia 7.5-46 mg once daily with no refills.  PDMP checked.  - Phentermine-Topiramate (QSYMIA) 7.5-46 MG CP24; Take 1 capsule by mouth in the morning.  Dispense: 30 capsule; Refill: 0  Kathleen Phillips is  currently in the action stage of change. As such, her goal is to continue with weight loss efforts. She has agreed to the Category 2 Plan.   Review labs with the patient from 08/14/2022, lipids, iron/anemia panel, vitamin D, B12, hematocrit, A1c, insulin, and TSH.  Patient will talk with her spouse.  Exercise goals: As is.  Behavioral modification strategies: increasing lean protein intake, decreasing simple carbohydrates, increasing vegetables, increasing water intake, decreasing eating out, no skipping meals, meal planning and cooking strategies, keeping healthy foods in the home, and planning for success.  Kathleen Phillips has agreed to follow-up with our clinic in 2 weeks. She was informed of the importance of frequent follow-up visits to maximize her success with intensive lifestyle modifications for her multiple health conditions.   Objective:   Blood pressure 107/70, pulse 85, temperature 98 F (36.7 C), height 5\' 9"  (1.753 m), weight 221 lb (100.2 kg), SpO2 97 %. Body mass index is 32.64 kg/m.  General: Cooperative, alert, well developed, in no acute distress. HEENT: Conjunctivae and lids unremarkable. Cardiovascular: Regular rhythm.  Lungs: Normal work of breathing. Neurologic: No focal deficits.   Lab Results  Component Value Date   CREATININE 0.80 11/16/2021   BUN 10 11/16/2021   NA 139 11/16/2021   K 4.1 11/16/2021   CL 105 11/16/2021   CO2 30 11/16/2021   Lab Results  Component Value Date   ALT 11 11/16/2021   AST 18 11/16/2021   BILITOT 0.7 11/16/2021   Lab Results  Component Value Date   HGBA1C 5.2 08/14/2022  Lab Results  Component Value Date   INSULIN 11.2 08/14/2022   Lab Results  Component Value Date   TSH 6.170 (H) 08/14/2022   Lab Results  Component Value Date   CHOL 147 08/14/2022   HDL 50 08/14/2022   LDLCALC 77 08/14/2022   TRIG 111 08/14/2022   CHOLHDL 2.4 11/16/2021   Lab Results  Component Value Date   VD25OH 27.5 (L) 08/14/2022   Lab  Results  Component Value Date   WBC 4.0 11/16/2021   HGB 12.1 11/16/2021   HCT 40.4 08/14/2022   MCV 96.3 11/16/2021   PLT 138 (L) 11/16/2021   Lab Results  Component Value Date   IRON 96 08/14/2022   TIBC 336 08/14/2022   FERRITIN 15 08/14/2022   Attestation Statements:   Reviewed by clinician on day of visit: allergies, medications, problem list, medical history, surgical history, family history, social history, and previous encounter notes.   Trude Mcburney, am acting as Energy manager for [employer], DO.  I have reviewed the above documentation for accuracy and completeness, and I agree with the above. Corinna Capra, DO

## 2022-08-30 NOTE — Telephone Encounter (Signed)
Started PA for Qsymia via covermymeds. 

## 2022-09-07 NOTE — Telephone Encounter (Signed)
Received fax from [employer] that Qsymia has been approved from 06/19/22-08/29/22 and 09/16/22-10/16/22.

## 2022-09-11 ENCOUNTER — Telehealth (INDEPENDENT_AMBULATORY_CARE_PROVIDER_SITE_OTHER): Payer: Commercial Managed Care - PPO | Admitting: Psychology

## 2022-09-11 DIAGNOSIS — F5089 Other specified eating disorder: Secondary | ICD-10-CM

## 2022-09-11 DIAGNOSIS — F319 Bipolar disorder, unspecified: Secondary | ICD-10-CM

## 2022-09-11 DIAGNOSIS — F4320 Adjustment disorder, unspecified: Secondary | ICD-10-CM
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
## 2022-09-12 ENCOUNTER — Encounter: Payer: Self-pay | Admitting: Bariatrics

## 2022-09-18 ENCOUNTER — Ambulatory Visit: Payer: Commercial Managed Care - PPO | Admitting: Bariatrics

## 2022-09-19 ENCOUNTER — Other Ambulatory Visit (HOSPITAL_BASED_OUTPATIENT_CLINIC_OR_DEPARTMENT_OTHER): Payer: Self-pay

## 2022-09-29 ENCOUNTER — Telehealth: Payer: Self-pay | Admitting: Physician Assistant

## 2022-09-29 DIAGNOSIS — R7989 Other specified abnormal findings of blood chemistry: Secondary | ICD-10-CM

## 2022-09-29 DIAGNOSIS — R790 Abnormal level of blood mineral: Secondary | ICD-10-CM

## 2022-09-29 DIAGNOSIS — E559 Vitamin D deficiency, unspecified: Secondary | ICD-10-CM

## 2022-09-29 NOTE — Telephone Encounter (Signed)
Patient is requesting lab orders be placed for [facility] at [facility]

## 2022-10-02 ENCOUNTER — Ambulatory Visit: Payer: Commercial Managed Care - PPO | Admitting: Nurse Practitioner

## 2022-10-02 DIAGNOSIS — R790 Abnormal level of blood mineral: Secondary | ICD-10-CM | POA: Diagnosis not present

## 2022-10-02 DIAGNOSIS — R7989 Other specified abnormal findings of blood chemistry: Secondary | ICD-10-CM | POA: Diagnosis not present

## 2022-10-02 DIAGNOSIS — E559 Vitamin D deficiency, unspecified: Secondary | ICD-10-CM | POA: Diagnosis not present

## 2022-10-03 ENCOUNTER — Telehealth (INDEPENDENT_AMBULATORY_CARE_PROVIDER_SITE_OTHER): Payer: Commercial Managed Care - PPO | Admitting: Psychology

## 2022-10-03 ENCOUNTER — Other Ambulatory Visit: Payer: Self-pay | Admitting: Physician Assistant

## 2022-10-03 ENCOUNTER — Other Ambulatory Visit (HOSPITAL_BASED_OUTPATIENT_CLINIC_OR_DEPARTMENT_OTHER): Payer: Self-pay

## 2022-10-03 LAB — CBC WITH DIFFERENTIAL/PLATELET
Basophils Absolute: 0 10*3/uL (ref 0.0–0.2)
Basos: 0 %
EOS (ABSOLUTE): 0.1 10*3/uL (ref 0.0–0.4)
Eos: 3 %
Hematocrit: 39.6 % (ref 34.0–46.6)
Hemoglobin: 13.2 g/dL (ref 11.1–15.9)
Immature Grans (Abs): 0 10*3/uL (ref 0.0–0.1)
Immature Granulocytes: 0 %
Lymphocytes Absolute: 1.8 10*3/uL (ref 0.7–3.1)
Lymphs: 36 %
MCH: 30.4 pg (ref 26.6–33.0)
MCHC: 33.3 g/dL (ref 31.5–35.7)
MCV: 91 fL (ref 79–97)
Monocytes Absolute: 0.4 10*3/uL (ref 0.1–0.9)
Monocytes: 8 %
Neutrophils Absolute: 2.5 10*3/uL (ref 1.4–7.0)
Neutrophils: 53 %
Platelets: 180 10*3/uL (ref 150–450)
RBC: 4.34 x10E6/uL (ref 3.77–5.28)
RDW: 13.7 % (ref 11.7–15.4)
WBC: 4.9 10*3/uL (ref 3.4–10.8)

## 2022-10-03 LAB — CMP AND LIVER
ALT: 9 IU/L (ref 0–32)
AST: 16 IU/L (ref 0–40)
Albumin: 4.2 g/dL (ref 3.9–4.9)
Alkaline Phosphatase: 78 IU/L (ref 44–121)
BUN: 10 mg/dL (ref 6–24)
Bilirubin Total: 0.9 mg/dL (ref 0.0–1.2)
Bilirubin, Direct: 0.21 mg/dL (ref 0.00–0.40)
CO2: 24 mmol/L (ref 20–29)
Calcium: 9.8 mg/dL (ref 8.7–10.2)
Chloride: 103 mmol/L (ref 96–106)
Creatinine, Ser: 0.94 mg/dL (ref 0.57–1.00)
Glucose: 102 mg/dL — ABNORMAL HIGH (ref 70–99)
Potassium: 4.2 mmol/L (ref 3.5–5.2)
Sodium: 139 mmol/L (ref 134–144)
Total Protein: 7 g/dL (ref 6.0–8.5)
eGFR: 77 mL/min/{1.73_m2} (ref >59)

## 2022-10-03 LAB — VITAMIN D 25 HYDROXY (VIT D DEFICIENCY, FRACTURES): Vit D, 25-Hydroxy: 30.6 ng/mL (ref 30.0–100.0)

## 2022-10-03 LAB — TSH+T4F+T3FREE
Free T4: 1.21 ng/dL (ref 0.82–1.77)
T3, Free: 2.6 pg/mL (ref 2.0–4.4)
TSH: 3.93 u[IU]/mL (ref 0.450–4.500)

## 2022-10-03 LAB — IRON,TIBC AND FERRITIN PANEL
Ferritin: 27 ng/mL (ref 15–150)
Iron Saturation: 57 % — ABNORMAL HIGH (ref 15–55)
Iron: 176 ug/dL — ABNORMAL HIGH (ref 27–159)
Total Iron Binding Capacity: 311 ug/dL (ref 250–450)
UIBC: 135 ug/dL (ref 131–425)

## 2022-10-03 MED ORDER — LEVOTHYROXINE SODIUM 75 MCG PO TABS
75.0000 ug | ORAL_TABLET | Freq: Every day | ORAL | 1 refills | Status: DC
  Filled 2022-10-03 – 2022-10-13 (×2): qty 90, 90d supply, fill #0
  Filled 2023-01-24: qty 90, 90d supply, fill #1

## 2022-10-03 NOTE — Progress Notes (Signed)
Kathleen Phillips,  Vitamin D improved some but still low. Add 2000 units daily of vitamin D.  Thyroid is much better. Sent refills.   How are you feeling?

## 2022-10-06 NOTE — Telephone Encounter (Signed)
Attempted to contact pt. No answer left VM requesting a return call to go over results.

## 2022-10-09 NOTE — Telephone Encounter (Signed)
Spoke with pt. Pt is aware of results and states that she has already been taking 2000 units daily. Pt also aware of refills sent. Pt also states she is feeling better.

## 2022-10-13 ENCOUNTER — Other Ambulatory Visit: Payer: Self-pay | Admitting: Oncology

## 2022-10-13 ENCOUNTER — Other Ambulatory Visit (HOSPITAL_BASED_OUTPATIENT_CLINIC_OR_DEPARTMENT_OTHER): Payer: Self-pay

## 2022-10-13 DIAGNOSIS — Z006 Encounter for examination for normal comparison and control in clinical research program: Secondary | ICD-10-CM

## 2022-10-18 ENCOUNTER — Other Ambulatory Visit (HOSPITAL_BASED_OUTPATIENT_CLINIC_OR_DEPARTMENT_OTHER): Payer: Self-pay

## 2022-10-20 ENCOUNTER — Other Ambulatory Visit (HOSPITAL_BASED_OUTPATIENT_CLINIC_OR_DEPARTMENT_OTHER): Payer: Self-pay

## 2022-10-24 ENCOUNTER — Encounter: Payer: Self-pay | Admitting: Physician Assistant

## 2022-10-24 ENCOUNTER — Other Ambulatory Visit (HOSPITAL_BASED_OUTPATIENT_CLINIC_OR_DEPARTMENT_OTHER): Payer: Self-pay

## 2022-10-24 MED ORDER — RIZATRIPTAN BENZOATE 10 MG PO TABS
10.0000 mg | ORAL_TABLET | ORAL | 0 refills | Status: DC | PRN
  Filled 2022-10-24: qty 10, 15d supply, fill #0

## 2022-11-13 ENCOUNTER — Encounter: Payer: Self-pay | Admitting: Family Medicine

## 2022-11-13 ENCOUNTER — Other Ambulatory Visit (HOSPITAL_BASED_OUTPATIENT_CLINIC_OR_DEPARTMENT_OTHER): Payer: Self-pay

## 2022-11-13 ENCOUNTER — Ambulatory Visit (INDEPENDENT_AMBULATORY_CARE_PROVIDER_SITE_OTHER): Payer: Commercial Managed Care - PPO | Admitting: Family Medicine

## 2022-11-13 VITALS — BP 109/73 | HR 79 | Ht 68.0 in | Wt 225.5 lb

## 2022-11-13 DIAGNOSIS — F3181 Bipolar II disorder: Secondary | ICD-10-CM | POA: Diagnosis not present

## 2022-11-13 DIAGNOSIS — F332 Major depressive disorder, recurrent severe without psychotic features: Secondary | ICD-10-CM

## 2022-11-13 DIAGNOSIS — Z1231 Encounter for screening mammogram for malignant neoplasm of breast: Secondary | ICD-10-CM | POA: Diagnosis not present

## 2022-11-13 DIAGNOSIS — Z0000 Encounter for general adult medical examination without abnormal findings: Secondary | ICD-10-CM | POA: Diagnosis not present

## 2022-11-13 MED ORDER — VILAZODONE HCL 40 MG PO TABS
40.0000 mg | ORAL_TABLET | Freq: Every day | ORAL | 11 refills | Status: DC
Start: 2022-11-13 — End: 2023-05-11
  Filled 2022-11-13: qty 30, 30d supply, fill #0

## 2022-11-13 MED ORDER — CARIPRAZINE HCL 3 MG PO CAPS
3.0000 mg | ORAL_CAPSULE | Freq: Every day | ORAL | 3 refills | Status: DC
Start: 2022-11-13 — End: 2023-10-21
  Filled 2022-11-13: qty 90, 90d supply, fill #0
  Filled 2023-03-21: qty 90, 90d supply, fill #1
  Filled 2023-07-03: qty 90, 90d supply, fill #2
  Filled 2023-10-07: qty 90, 90d supply, fill #3

## 2022-11-13 NOTE — Progress Notes (Signed)
Established patient visit   Patient: Kathleen Phillips   DOB: 12/25/1975   47 y.o. Female  MRN: [id number] Visit Date: 11/13/2022  Today's healthcare provider: Charlton Amor, DO   Chief Complaint  Patient presents with   Annual Exam    SUBJECTIVE    Chief Complaint  Patient presents with   Annual Exam   HPI   Pt presents for wellness visit today.   Diet:  trying to improve Exercise: has decreased recently due to more sitting   Screenings: Colon Cancer screenings (start at age 57): Pap smears: up to date Mammograms: needs one  Family hx: HTN: mom and brother  DM: mom Cancer: dad- renal cancer, MGP-bladder ca Heart disease- Mom at 58 had heart attack  Social hx: Alcohol use: no Tobacco (chew, smoke): no Lives with husband and animals Safe at home: yes    Review of Systems  Constitutional:  Negative for activity change, fatigue and fever.  Respiratory:  Negative for cough and shortness of breath.   Cardiovascular:  Negative for chest pain.  Gastrointestinal:  Negative for abdominal pain.  Genitourinary:  Negative for difficulty urinating.       Current Meds  Medication Sig   EPINEPHrine 0.3 mg/0.3 mL IJ SOAJ injection Inject 0.3 mg into the muscle as needed for anaphylaxis.   levocetirizine (XYZAL) 5 MG tablet TAKE 1 TABLET (5 MG TOTAL) BY MOUTH EVERY EVENING.   levothyroxine (SYNTHROID) 75 MCG tablet Take 1 tablet (75 mcg total) by mouth daily.   meclizine (ANTIVERT) 25 MG tablet Take 1 tablet (25 mg total) by mouth 3 (three) times daily as needed for dizziness.   meloxicam (MOBIC) 15 MG tablet Take 1 tablet (15 mg total) by mouth daily.   Multiple Vitamins-Minerals (MULTIVITAMIN WITH MINERALS) tablet Take 1 tablet by mouth daily.   ondansetron (ZOFRAN-ODT) 8 MG disintegrating tablet Take 1 tablet (8 mg total) by mouth every 8 (eight) hours as needed for nausea.   rizatriptan (MAXALT) 10 MG tablet Take 1 tablet (10 mg total) by mouth as needed for  migraine. May repeat in 2 hours if needed   traMADol (ULTRAM) 50 MG tablet Take 1-2 tablets (50-100 mg total) by mouth every 8 (eight) hours as needed for moderate pain. Maximum 6 tabs per day.   valACYclovir (VALTREX) 500 MG tablet Take one tablet every 12 hours for 3 days for outbreaks.   Vitamin D, Ergocalciferol, (DRISDOL) 1.25 MG (50000 UNIT) CAPS capsule Take 1 capsule (50,000 Units total) by mouth every 7 (seven) days.   [DISCONTINUED] cariprazine (VRAYLAR) 3 MG capsule Take 1 capsule (3 mg total) by mouth daily.   [DISCONTINUED] Vilazodone HCl (VIIBRYD) 40 MG TABS Take 1 tablet (40 mg total) by mouth daily.    OBJECTIVE    BP 109/73 (BP Location: Left Arm, Patient Position: Sitting, Cuff Size: Large)   Pulse 79   Ht 5\' 8"  (1.727 m)   Wt 225 lb 8 oz (102.3 kg)   SpO2 100%   BMI 34.29 kg/m   Physical Exam Vitals and nursing note reviewed.  Constitutional:      General: She is not in acute distress.    Appearance: Normal appearance.  HENT:     Head: Normocephalic and atraumatic.     Right Ear: Tympanic membrane, ear canal and external ear normal.     Left Ear: Tympanic membrane, ear canal and external ear normal.     Nose: Nose normal.  Eyes:     Conjunctiva/sclera:  Conjunctivae normal.  Cardiovascular:     Rate and Rhythm: Normal rate and regular rhythm.  Pulmonary:     Effort: Pulmonary effort is normal.     Breath sounds: Normal breath sounds.  Abdominal:     General: Abdomen is flat. Bowel sounds are normal.     Palpations: Abdomen is soft.  Neurological:     General: No focal deficit present.     Mental Status: She is alert and oriented to person, place, and time.  Psychiatric:        Mood and Affect: Mood normal.        Behavior: Behavior normal.        Thought Content: Thought content normal.        Judgment: Judgment normal.          ASSESSMENT & PLAN    Problem List Items Addressed This Visit       Other   Bipolar 2 disorder, major depressive  episode (HCC)   Relevant Medications   Vilazodone HCl (VIIBRYD) 40 MG TABS   Major depressive disorder, recurrent, severe without psychotic features (HCC)   Relevant Medications   Vilazodone HCl (VIIBRYD) 40 MG TABS   Mixed bipolar II disorder (HCC)   Relevant Medications   cariprazine (VRAYLAR) 3 MG capsule   Other Visit Diagnoses     Routine adult health maintenance    -  Primary   Relevant Orders   CBC   Basic Metabolic Panel (BMET)   Lipid panel   Encounter for screening mammogram for malignant neoplasm of breast       Relevant Orders   MM DIGITAL SCREENING BILATERAL      Pt doing well from wellness standpoint. PHQ9 and GAD7 elevated however patient says she is going through a lot at work. Says she doesn't have a good support system. Will go ahead and place referral for behavioral health to see if we can get her virtual appointments that fit with her work schedule   Return in about 1 year (around 11/13/2023), or wellness.      Meds ordered this encounter  Medications   Vilazodone HCl (VIIBRYD) 40 MG TABS    Sig: Take 1 tablet (40 mg total) by mouth daily.    Dispense:  30 tablet    Refill:  11   cariprazine (VRAYLAR) 3 MG capsule    Sig: Take 1 capsule (3 mg total) by mouth daily.    Dispense:  90 capsule    Refill:  3    Orders Placed This Encounter  Procedures   MM DIGITAL SCREENING BILATERAL    Standing Status:   Future    Standing Expiration Date:   11/13/2023    Order Specific Question:   Is the patient pregnant?    Answer:   No    Order Specific Question:   Preferred imaging location?    Answer:   [facility]    Order Specific Question:   Reason for exam:    Answer:   screening for breast cancer    Order Specific Question:   Release to patient    Answer:   Immediate   CBC   Basic Metabolic Panel (BMET)    Order Specific Question:   Has the patient fasted?    Answer:   No   Lipid panel    Order Specific Question:   Has the patient fasted?     Answer:   No    Order Specific Question:   Release  to patient    Answer:   Immediate     Charlton Amor, DO  [facility] at [facility] [phone] (phone) [phone] (fax)  [facility]

## 2022-11-20 ENCOUNTER — Encounter: Payer: Commercial Managed Care - PPO | Admitting: Physician Assistant

## 2022-11-21 ENCOUNTER — Other Ambulatory Visit (HOSPITAL_BASED_OUTPATIENT_CLINIC_OR_DEPARTMENT_OTHER): Payer: Self-pay

## 2022-12-15 ENCOUNTER — Encounter: Payer: Self-pay | Admitting: Family Medicine

## 2022-12-18 ENCOUNTER — Other Ambulatory Visit: Payer: Self-pay | Admitting: Family Medicine

## 2022-12-18 DIAGNOSIS — E870 Hyperosmolality and hypernatremia: Secondary | ICD-10-CM

## 2023-01-04 ENCOUNTER — Other Ambulatory Visit: Payer: Self-pay | Admitting: Physician Assistant

## 2023-01-04 ENCOUNTER — Other Ambulatory Visit (HOSPITAL_BASED_OUTPATIENT_CLINIC_OR_DEPARTMENT_OTHER): Payer: Self-pay

## 2023-01-04 ENCOUNTER — Telehealth: Payer: Self-pay | Admitting: Physician Assistant

## 2023-01-04 ENCOUNTER — Other Ambulatory Visit: Payer: Self-pay

## 2023-01-04 DIAGNOSIS — F411 Generalized anxiety disorder: Secondary | ICD-10-CM

## 2023-01-04 MED ORDER — BUSPIRONE HCL 15 MG PO TABS
15.0000 mg | ORAL_TABLET | Freq: Three times a day (TID) | ORAL | 2 refills | Status: DC | PRN
Start: 2023-01-04 — End: 2023-05-11
  Filled 2023-01-04: qty 90, 30d supply, fill #0

## 2023-01-04 NOTE — Telephone Encounter (Signed)
Patient called she is requesting a refill for Buspar 15mg   Please submit to [facility] Phone [phone]

## 2023-01-05 ENCOUNTER — Ambulatory Visit: Payer: Commercial Managed Care - PPO | Admitting: Physician Assistant

## 2023-01-05 ENCOUNTER — Encounter: Payer: Self-pay | Admitting: Physician Assistant

## 2023-01-05 ENCOUNTER — Other Ambulatory Visit (HOSPITAL_BASED_OUTPATIENT_CLINIC_OR_DEPARTMENT_OTHER): Payer: Self-pay

## 2023-01-05 VITALS — BP 121/65 | HR 71 | Ht 68.0 in | Wt 225.0 lb

## 2023-01-05 DIAGNOSIS — R300 Dysuria: Secondary | ICD-10-CM

## 2023-01-05 DIAGNOSIS — N3001 Acute cystitis with hematuria: Secondary | ICD-10-CM

## 2023-01-05 LAB — POCT URINALYSIS DIP (CLINITEK)
Bilirubin, UA: NEGATIVE
Glucose, UA: NEGATIVE mg/dL
Ketones, POC UA: NEGATIVE mg/dL
Nitrite, UA: NEGATIVE
POC PROTEIN,UA: NEGATIVE
Spec Grav, UA: 1.01 (ref 1.010–1.025)
Urobilinogen, UA: 0.2 U/dL
pH, UA: 7 (ref 5.0–8.0)

## 2023-01-05 MED ORDER — CEPHALEXIN 500 MG PO CAPS
500.0000 mg | ORAL_CAPSULE | Freq: Two times a day (BID) | ORAL | 0 refills | Status: DC
Start: 2023-01-05 — End: 2023-05-07
  Filled 2023-01-05: qty 14, 7d supply, fill #0
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
## 2023-01-05 NOTE — Patient Instructions (Signed)
Urinary Tract Infection, Adult A urinary tract infection (UTI) is an infection of any part of the urinary tract. The urinary tract includes: The kidneys. The ureters. The bladder. The urethra. These organs make, store, and get rid of pee (urine) in the body. What are the causes? This infection is caused by germs (bacteria) in your genital area. These germs grow and cause swelling (inflammation) of your urinary tract. What increases the risk? The following factors may make you more likely to develop this condition: Using a small, thin tube (catheter) to drain pee. Not being able to control when you pee or poop (incontinence). Being female. If you are female, these things can increase the risk: Using these methods to prevent pregnancy: A medicine that kills sperm (spermicide). A device that blocks sperm (diaphragm). Having low levels of a female hormone (estrogen). Being pregnant. You are more likely to develop this condition if: You have genes that add to your risk. You are sexually active. You take antibiotic medicines. You have trouble peeing because of: A prostate that is bigger than normal, if you are female. A blockage in the part of your body that drains pee from the bladder. A kidney stone. A nerve condition that affects your bladder. Not getting enough to drink. Not peeing often enough. You have other conditions, such as: Diabetes. A weak disease-fighting system (immune system). Sickle cell disease. Gout. Injury of the spine. What are the signs or symptoms? Symptoms of this condition include: Needing to pee right away. Peeing small amounts often. Pain or burning when peeing. Blood in the pee. Pee that smells bad or not like normal. Trouble peeing. Pee that is cloudy. Fluid coming from the vagina, if you are female. Pain in the belly or lower back. Other symptoms include: Vomiting. Not feeling hungry. Feeling mixed up (confused). This may be the first symptom in  older adults. Being tired and grouchy (irritable). A fever. Watery poop (diarrhea). How is this treated? Taking antibiotic medicine. Taking other medicines. Drinking enough water. In some cases, you may need to see a specialist. Follow these instructions at home:  Medicines Take over-the-counter and prescription medicines only as told by your doctor. If you were prescribed an antibiotic medicine, take it as told by your doctor. Do not stop taking it even if you start to feel better. General instructions Make sure you: Pee until your bladder is empty. Do not hold pee for a long time. Empty your bladder after sex. Wipe from front to back after peeing or pooping if you are a female. Use each tissue one time when you wipe. Drink enough fluid to keep your pee pale yellow. Keep all follow-up visits. Contact a doctor if: You do not get better after 1-2 days. Your symptoms go away and then come back. Get help right away if: You have very bad back pain. You have very bad pain in your lower belly. You have a fever. You have chills. You feeling like you will vomit or you vomit. Summary A urinary tract infection (UTI) is an infection of any part of the urinary tract. This condition is caused by germs in your genital area. There are many risk factors for a UTI. Treatment includes antibiotic medicines. Drink enough fluid to keep your pee pale yellow. This information is not intended to replace advice given to you by your health care provider. Make sure you discuss any questions you have with your health care provider. Document Revised: 10/26/2019 Document Reviewed: 10/31/2019 [employer] Patient Education    2024 [employer]  

## 2023-01-09 ENCOUNTER — Other Ambulatory Visit (HOSPITAL_BASED_OUTPATIENT_CLINIC_OR_DEPARTMENT_OTHER): Payer: Self-pay

## 2023-01-09 MED ORDER — RIZATRIPTAN BENZOATE 10 MG PO TABS
10.0000 mg | ORAL_TABLET | ORAL | 0 refills | Status: DC | PRN
  Filled 2023-01-09 – 2023-10-21 (×2): qty 10, 15d supply, fill #0

## 2023-01-16 LAB — BASIC METABOLIC PANEL
BUN/Creatinine Ratio: 10 (ref 9–23)
BUN: 9 mg/dL (ref 6–24)
CO2: 21 mmol/L (ref 20–29)
Calcium: 9.6 mg/dL (ref 8.7–10.2)
Chloride: 104 mmol/L (ref 96–106)
Creatinine, Ser: 0.91 mg/dL (ref 0.57–1.00)
Glucose: 95 mg/dL (ref 70–99)
Potassium: 4.6 mmol/L (ref 3.5–5.2)
Sodium: 140 mmol/L (ref 134–144)
eGFR: 79 mL/min/{1.73_m2} (ref >59)

## 2023-01-18 ENCOUNTER — Encounter: Payer: Self-pay | Admitting: Physician Assistant

## 2023-01-22 ENCOUNTER — Other Ambulatory Visit (HOSPITAL_BASED_OUTPATIENT_CLINIC_OR_DEPARTMENT_OTHER): Payer: Self-pay

## 2023-01-22 MED ORDER — FLULAVAL 0.5 ML IM SUSY
0.5000 mL | PREFILLED_SYRINGE | Freq: Once | INTRAMUSCULAR | 0 refills | Status: AC
  Filled 2023-01-22: qty 0.5, 1d supply, fill #0

## 2023-01-24 ENCOUNTER — Other Ambulatory Visit (HOSPITAL_BASED_OUTPATIENT_CLINIC_OR_DEPARTMENT_OTHER): Payer: Self-pay

## 2023-01-24 ENCOUNTER — Other Ambulatory Visit: Payer: Self-pay

## 2023-01-24 ENCOUNTER — Other Ambulatory Visit: Payer: Self-pay | Admitting: Physician Assistant

## 2023-01-26 ENCOUNTER — Other Ambulatory Visit: Payer: Self-pay

## 2023-01-26 ENCOUNTER — Other Ambulatory Visit (HOSPITAL_BASED_OUTPATIENT_CLINIC_OR_DEPARTMENT_OTHER): Payer: Self-pay

## 2023-01-26 MED ORDER — MELOXICAM 15 MG PO TABS
15.0000 mg | ORAL_TABLET | Freq: Every day | ORAL | 1 refills | Status: DC
  Filled 2023-01-26: qty 90, 90d supply, fill #0
  Filled 2023-04-30: qty 90, 90d supply, fill #1

## 2023-02-02 ENCOUNTER — Other Ambulatory Visit (HOSPITAL_COMMUNITY)
Admission: RE | Admit: 2023-02-02 | Discharge: 2023-02-02 | Disposition: A | Discharge status: Home / Self Care | Payer: Commercial Managed Care - PPO | Source: Ambulatory Visit | Attending: Oncology | Admitting: Oncology

## 2023-02-02 DIAGNOSIS — Z006 Encounter for examination for normal comparison and control in clinical research program: Secondary | ICD-10-CM | POA: Insufficient documentation

## 2023-02-09 ENCOUNTER — Encounter (HOSPITAL_COMMUNITY): Payer: Self-pay | Admitting: Psychiatry

## 2023-02-09 ENCOUNTER — Other Ambulatory Visit (HOSPITAL_BASED_OUTPATIENT_CLINIC_OR_DEPARTMENT_OTHER): Payer: Self-pay

## 2023-02-09 ENCOUNTER — Ambulatory Visit (HOSPITAL_BASED_OUTPATIENT_CLINIC_OR_DEPARTMENT_OTHER): Payer: Commercial Managed Care - PPO | Admitting: Psychiatry

## 2023-02-09 ENCOUNTER — Other Ambulatory Visit (HOSPITAL_COMMUNITY): Payer: Self-pay | Admitting: Psychiatry

## 2023-02-09 VITALS — Wt 235.0 lb

## 2023-02-09 DIAGNOSIS — F5101 Primary insomnia: Secondary | ICD-10-CM

## 2023-02-09 DIAGNOSIS — F3181 Bipolar II disorder: Secondary | ICD-10-CM | POA: Diagnosis not present

## 2023-02-09 DIAGNOSIS — F330 Major depressive disorder, recurrent, mild: Secondary | ICD-10-CM

## 2023-02-09 DIAGNOSIS — F419 Anxiety disorder, unspecified: Secondary | ICD-10-CM

## 2023-02-09 MED ORDER — VORTIOXETINE HBR 5 MG PO TABS
ORAL_TABLET | ORAL | 1 refills | Status: DC
Start: 2023-02-09 — End: 2023-03-09
  Filled 2023-02-09: qty 60, 33d supply, fill #0
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
## 2023-02-12 ENCOUNTER — Other Ambulatory Visit (HOSPITAL_BASED_OUTPATIENT_CLINIC_OR_DEPARTMENT_OTHER): Payer: Self-pay

## 2023-02-14 ENCOUNTER — Other Ambulatory Visit (HOSPITAL_BASED_OUTPATIENT_CLINIC_OR_DEPARTMENT_OTHER): Payer: Self-pay

## 2023-02-14 ENCOUNTER — Encounter (HOSPITAL_COMMUNITY): Payer: Self-pay | Admitting: Psychiatry

## 2023-02-14 DIAGNOSIS — F419 Anxiety disorder, unspecified: Secondary | ICD-10-CM

## 2023-02-14 DIAGNOSIS — F330 Major depressive disorder, recurrent, mild: Secondary | ICD-10-CM

## 2023-02-14 DIAGNOSIS — F3181 Bipolar II disorder: Secondary | ICD-10-CM

## 2023-02-14 DIAGNOSIS — F5101 Primary insomnia: Secondary | ICD-10-CM

## 2023-02-14 LAB — HELIX MOLECULAR SCREEN: Genetic Analysis Overall Interpretation: NEGATIVE

## 2023-02-14 LAB — GENECONNECT MOLECULAR SCREEN

## 2023-02-14 MED ORDER — VORTIOXETINE HBR 10 MG PO TABS
10.0000 mg | ORAL_TABLET | Freq: Every day | ORAL | 0 refills | Status: DC
  Filled 2023-02-14: qty 30, 30d supply, fill #0

## 2023-02-16 DIAGNOSIS — H18822 Corneal disorder due to contact lens, left eye: Secondary | ICD-10-CM | POA: Diagnosis not present

## 2023-02-16 DIAGNOSIS — H1045 Other chronic allergic conjunctivitis: Secondary | ICD-10-CM | POA: Diagnosis not present

## 2023-02-16 DIAGNOSIS — H04123 Dry eye syndrome of bilateral lacrimal glands: Secondary | ICD-10-CM | POA: Diagnosis not present

## 2023-03-09 ENCOUNTER — Encounter (HOSPITAL_COMMUNITY): Payer: Self-pay | Admitting: Psychiatry

## 2023-03-09 ENCOUNTER — Telehealth (HOSPITAL_COMMUNITY): Payer: Self-pay | Admitting: Psychiatry

## 2023-03-09 ENCOUNTER — Other Ambulatory Visit (HOSPITAL_BASED_OUTPATIENT_CLINIC_OR_DEPARTMENT_OTHER): Payer: Self-pay

## 2023-03-09 ENCOUNTER — Telehealth (HOSPITAL_BASED_OUTPATIENT_CLINIC_OR_DEPARTMENT_OTHER): Payer: Commercial Managed Care - PPO | Admitting: Psychiatry

## 2023-03-09 VITALS — Wt 235.0 lb

## 2023-03-09 DIAGNOSIS — F419 Anxiety disorder, unspecified: Secondary | ICD-10-CM

## 2023-03-09 DIAGNOSIS — F3181 Bipolar II disorder: Secondary | ICD-10-CM

## 2023-03-09 DIAGNOSIS — F5101 Primary insomnia: Secondary | ICD-10-CM | POA: Diagnosis not present

## 2023-03-09 MED ORDER — VORTIOXETINE HBR 20 MG PO TABS
20.0000 mg | ORAL_TABLET | Freq: Every day | ORAL | 1 refills | Status: DC
Start: 2023-03-09 — End: 2023-05-11
  Filled 2023-03-09 – 2023-03-21 (×2): qty 30, 30d supply, fill #0
  Filled 2023-04-30: qty 30, 30d supply, fill #1

## 2023-03-09 NOTE — Telephone Encounter (Signed)
D:  Dr. Lolly Mustache referred pt to virtual MH-IOP.  A:  Placed call to orient pt.  Pt states she works Monday thru Friday from 8 a.m-5pm.  Informed pt that she can be written out of work via short term disability/FMLA in order to do the group.  Pt declined.  Case mgr mentioned there are other groups that meet in the evening (ie. [facility] and [facility]).  Provided pt with phone #'s.  Inform Dr. Lolly Mustache.  R:  Pt receptive.

## 2023-03-09 NOTE — Progress Notes (Signed)
[facility] MD Virtual Progress Note   Patient Location: Work Provider Location: Home Office  I connect with patient by video and verified that I am speaking with correct person by using two identifiers. I discussed the limitations of evaluation and management by telemedicine and the availability of in person appointments. I also discussed with the patient that there may be a patient responsible charge related to this service. The patient expressed understanding and agreed to proceed.  Kathleen Phillips [id number] 47 y.o.  03/09/2023 11:34 AM  History of Present Illness:  Patient is evaluated by video session.  She is a 47 year old married, employed female who was referred from primary care and seen first time 6 weeks ago.  She was given the diagnosis of bipolar disorder, depression, anxiety.  Her medicines were prescribed by PCP which are Viibryd 40 mg daily, Vraylar 3 mg and BuSpar 30 mg to take as needed.  She was struggling with irritability, depression, insomnia, weight gain and lack of motivation to do things.  Her stressors are husband diagnosed with muscular disease and patient is a primary caretaker of the husband.  She is also in the home and school getting bachelor's in health science and struggling with progress.  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  She reported having irritability, frustration, excessive weight gain.  We recommended discontinue Viibryd and try Trintellix and IOP.  Patient has not heard from IOP program coordinator but started taking the Trintellix.  She is taking 10 mg and she is no longer taking Viibryd.  She noticed increased energy but also reported frustration irritability and recently she noticed that angry and punched the thumb.  She denies any hallucination, paranoia, suicidal thoughts or homicidal thoughts.  Her husband recently received money from the Social Security which was on hold after her husband disabled.  Her online  school is going very slowly.  She like to finish in 2026.  She is taking 1 class and do not have enough time to make it faster.  Patient still feels unmotivated and lack of desire to do things.  She was noticed started to work every day but then cold weather came and did not able to walk outside.  Currently she is taking 10 mg Trintellix.  She has no tremors, shakes or any EPS.  She takes the BuSpar 30 mg but only as needed and since past few months she has taken twice but cut down the dose to half to one fourth.  Her appetite is unchanged.  Her weight is unchanged.  She diagnosed with CPAP but have difficulty with the mask and not using it.  Past Psychiatric History: History of bipolar disorder.  No history of suicidal attempt but history of inpatient 10 years ago at [facility]  Had tried Celexa, Zoloft, Paxil, Remeron, BuSpar, hydroxyzine, Belsomra, risperidone.  Wellbutrin caused itching.  Lamictal did not work.  History of mania, excessive spending, anger, irritability.  Seen psychiatrist [facility] and [facility] lately medicines are given by primary care.  She tried Adderall but after the psychological evaluation did not diagnosed with ADD and it was discontinued.  No history of drug use.    Outpatient Encounter Medications as of 03/09/2023  Medication Sig   busPIRone (BUSPAR) 15 MG tablet Take 1 tablet (15 mg total) by mouth 3 (three) times daily as needed.   cariprazine (VRAYLAR) 3 MG capsule Take 1 capsule (3 mg total) by mouth daily.   cephALEXin (KEFLEX) 500  MG capsule Take 1 capsule (500 mg total) by mouth 2 (two) times daily.   Cholecalciferol (VITAMIN D) 50 MCG (2000 UT) tablet Take 2,000 Units by mouth daily.   EPINEPHrine 0.3 mg/0.3 mL IJ SOAJ injection Inject 0.3 mg into the muscle as needed for anaphylaxis.   levocetirizine (XYZAL) 5 MG tablet TAKE 1 TABLET (5 MG TOTAL) BY MOUTH EVERY EVENING.   levothyroxine (SYNTHROID) 75 MCG tablet Take 1 tablet (75 mcg total) by mouth  daily.   meclizine (ANTIVERT) 25 MG tablet Take 1 tablet (25 mg total) by mouth 3 (three) times daily as needed for dizziness.   meloxicam (MOBIC) 15 MG tablet Take 1 tablet (15 mg total) by mouth daily.   Multiple Vitamins-Minerals (MULTIVITAMIN WITH MINERALS) tablet Take 1 tablet by mouth daily.   ondansetron (ZOFRAN-ODT) 8 MG disintegrating tablet Take 1 tablet (8 mg total) by mouth every 8 (eight) hours as needed for nausea.   Phentermine-Topiramate (QSYMIA) 7.5-46 MG CP24 Take 1 capsule by mouth in the morning.   rizatriptan (MAXALT) 10 MG tablet Take 1 tablet (10 mg total) by mouth as needed for migraine. May repeat in 2 hours if needed.   traMADol (ULTRAM) 50 MG tablet Take 1-2 tablets (50-100 mg total) by mouth every 8 (eight) hours as needed for moderate pain. Maximum 6 tabs per day.   valACYclovir (VALTREX) 500 MG tablet Take one tablet every 12 hours for 3 days for outbreaks.   Vilazodone HCl (VIIBRYD) 40 MG TABS Take 1 tablet (40 mg total) by mouth daily.   Vitamin D, Ergocalciferol, (DRISDOL) 1.25 MG (50000 UNIT) CAPS capsule Take 1 capsule (50,000 Units total) by mouth every 7 (seven) days. (Patient not taking: Reported on 02/09/2023)   vortioxetine HBr (TRINTELLIX) 10 MG TABS tablet Take 1 tablet (10 mg total) by mouth daily.   vortioxetine HBr (TRINTELLIX) 5 MG TABS tablet Take one tab daily for one week and than two tab daily   No facility-administered encounter medications on file as of 03/09/2023.    Recent Results (from the past 2160 hour(s))  POCT URINALYSIS DIP (CLINITEK)     Status: Abnormal   Collection Time: 01/05/23  2:45 PM  Result Value Ref Range   Color, UA yellow yellow   Clarity, UA clear (A) clear   Glucose, UA negative negative mg/dL   Bilirubin, UA negative negative   Ketones, POC UA negative negative mg/dL   Spec Grav, UA 1.610 9.604 - 1.025   Blood, UA trace-intact (A) negative   pH, UA 7.0 5.0 - 8.0   POC PROTEIN,UA negative negative, trace    Urobilinogen, UA 0.2 0.2 or 1.0 E.U./dL   Nitrite, UA Negative Negative   Leukocytes, UA Trace (A) Negative  Basic Metabolic Panel (BMET)     Status: None   Collection Time: 01/15/23 10:36 AM  Result Value Ref Range   Glucose 95 70 - 99 mg/dL   BUN 9 6 - 24 mg/dL   Creatinine, Ser 5.40 0.57 - 1.00 mg/dL   eGFR 79 >98 JX/BJY/7.82   BUN/Creatinine Ratio 10 9 - 23   Sodium 140 134 - 144 mmol/L   Potassium 4.6 3.5 - 5.2 mmol/L   Chloride 104 96 - 106 mmol/L   CO2 21 20 - 29 mmol/L   Calcium 9.6 8.7 - 10.2 mg/dL  [facility] Molecular Screen- Blood ([facility])     Status: None   Collection Time: 02/02/23  3:51 PM  Result Value Ref Range   Genetic  Analysis Overall Interpretation Negative    Genetic Disease Assessed      [facility] Tier One Population Screen is a screening test that analyzes 11 genes related to hereditary breast and ovarian cancer (HBOC) syndrome, Lynch syndrome, and familial hypercholesterolemia. This test only reports clinically significant pathogenic and  likely pathogenic variants but does not report variants of uncertain significance (VUS). In addition, analysis of the PMS2 gene excludes exons 11-15, which overlap with a known pseudogene (PMS2CL).    Genetic Analysis Report      No pathogenic or likely pathogenic variants were detected in the genes analyzed by this test.Genetic test results should be interpreted in the context of an individual's personal medical and family history. Alteration to medical management is NOT  recommended based solely on this result. Clinical correlation is advised.Additional Considerations- This is a screening test; individuals may still carry pathogenic or likely pathogenic variant(s) in the tested genes that are not detected by this test.-  For individuals at risk for these or other related conditions based on factors including personal or family history, diagnostic testing is recommended.- The absence of pathogenic or likely pathogenic  variant(s) in the analyzed genes, while reassuring,  does not eliminate the possibility of a hereditary condition; there are other variants and genes associated with heart disease and hereditary cancer that are not included in this test.    Genes Tested See Notes     Comment: APOB, BRCA1, BRCA2, EPCAM, LDLR, LDLRAP1, PCSK9, PMS2, MLH1, MSH2, MSH6   Disclaimer See Notes     Comment: This test was developed and validated by [facility] This test has not been cleared or approved by the [place] (FDA). The [facility] laboratory is accredited by the College of American Pathologists (CAP) and certified under  the Clinical Laboratory Improvement Amendments (CLIA #: [id number]) to perform high-complexity clinical tests. This test is used for clinical purposes. It should not be regarded as investigational or for research.    Sequencing Location See Notes     Comment: Sequencing done at [facility] [address], [address], [place], [place] [address] (CLIA# [id number])   Interpretation Methods and Limitations See Notes     Comment: Extracted DNA is enriched for targeted regions and then sequenced using the [facility] Exome+ (R) assay on an Illumina DNA sequencing system. Data is then aligned to a modified version of GRCh38 and all genes are analyzed using the MANE transcript and MANE  Plus Clinical transcript, when available. Small variant calling is completed using a customized version of [employer]'s DNAseq software, augmented by a proprietary small variant caller for difficult variants. Copy number variants (CNVs) are then called  using a proprietary bioinformatics pipeline based on depth analysis with a comparison to similarly sequenced samples. Analysis of the PMS2 gene is limited to exons 1-10. The interpretation and reporting of variants in APOB, PCSK9, and LDLR is specific to  familial hypercholesterolemia; variants associated with hypobetalipoproteinemia are not included.  Interpretation is based upon guidelines published by the [employer] and Genomics [employer]) and the Association for  Molecular  Pathology (AMP) or their modification by [employer] when available. Interpretation is limited to the transcripts indicated on the report and +/- 10 bp into intronic regions, except as noted below. [facility] variant classifications  include pathogenic, likely pathogenic, variant of uncertain significance (VUS), likely benign, and benign. Only variants classified as pathogenic and likely pathogenic are included in the report. All reported  variants are confirmed through secondary  manual inspection of DNA sequence data or orthogonal testing. Risk estimations and management guidelines included in this report are based on analysis of primary literature and recommendations of applicable professional societies, and should be regarded  as approximations.Based on validation studies, this assay delivers > 99% sensitivity and specificity for single nucleotide variants and insertions and deletions (indels) up to 20 bp. Larger indels and complex variants are a lso reported but sensitivity  may be reduced. Based on validation studies, this assay delivers > 99% sensitivity to multi-exon CNVs and > 90% sensitivity to single-exon CNVs. This test may not detect variants in challenging regions (such as short tandem repeats, homopolymer runs, and  segment duplications), sub-exonic CNVs, chromosomal aneuploidy, or variants in the presence of mosaicism. Phasing will be attempted and reported, when possible. Structural rearrangements such as inversions, translocations, and gene conversions are not  tested in this assay unless explicitly indicated. Additionally, deep intronic, promoter, and enhancer regions may not be covered. It is important to note that this is a screening test and cannot detect all disease-causing variants. A negative result does  not  guarantee the absence of a rare, undetectable variant in the genes analyzed; consider using a diagnostic test if there is significant personal and/or family history of one of the conditions analyze d by this test. Any potential incidental findings  outside of these genes and conditions will not be identified, nor reported. The results of a genetic test may be influenced by various factors, including bone marrow transplantation, blood transfusions, or in rare cases, hematolymphoid neoplasms.Gene  Specific Notes:APOB: analysis is limited to c.10580G>A and c.10579C>T; BRCA1: sequencing analysis extends to CDS +/-20 bp; BRCA2: sequencing analysis extends to CDS +/-20 bp. EPCAM: analysis is limited to CNV of exons 8-9; MLH1: analysis includes CNV of  the promoter; PMS2: analysis is limited to exons 1-10.Gardenia Phlegm, PhD, [email or web address]      Psychiatric Specialty Exam: Physical Exam  Review of Systems  Psychiatric/Behavioral:  Positive for dysphoric mood and sleep disturbance.     Weight 235 lb (106.6 kg).There is no height or weight on file to calculate BMI.  General Appearance: Casual  Eye Contact:  Good  Speech:  Normal Rate  Volume:  Normal  Mood:  Euthymic  Affect:  Congruent  Thought Process:  Goal Directed  Orientation:  Full (Time, Place, and Person)  Thought Content:  Rumination  Suicidal Thoughts:  No  Homicidal Thoughts:  No  Memory:  Immediate;   Good Recent;   Good Remote;   Good  Judgement:  Intact  Insight:  Present  Psychomotor Activity:  Normal  Concentration:  Concentration: Good and Attention Span: Good  Recall:  Good  Fund of Knowledge:  Good  Language:  Good  Akathisia:  No  Handed:  Right  AIMS (if indicated):     Assets:  Communication Skills Desire for Improvement Housing Talents/Skills Transportation  ADL's:  Intact  Cognition:  WNL  Sleep: Fair     Assessment/Plan: Mixed bipolar II disorder (HCC) - Plan: vortioxetine HBr  (TRINTELLIX) 20 MG TABS tablet  Primary insomnia - Plan: vortioxetine HBr (TRINTELLIX) 20 MG TABS tablet  Anxiety - Plan: vortioxetine HBr (TRINTELLIX) 20 MG TABS tablet  I review psychosocial stressors.  We will reach out to IOP staff coordinator to give her a call to consider group therapy.  I discussed medication side effects and recommend to try Trintellix 20 mg.  She uses to take Viibryd 40 mg but  since started the Trintellix she had stopped.  I encouraged to continue Vraylar 3 mg for now.  We talk about BuSpar dose which is 30 mg and patient take only one fourth rarely when she feels very nervous or anxious.  I recommend if she wants she can take the low-dose BuSpar every day rather than taking as needed.  However patient comfortable to take as needed.  So far no major concern however discussed some time antidepressants can cause tremor or shakes.  She need to monitor closely her symptoms and possible side effects.  Patient has plan to spend Christmas with the family who lives in states.  Patient has 2 older brother who lives 45 minutes away, daughter lives out of town and parents are deceased.   Follow Up Instructions:     I discussed the assessment and treatment plan with the patient. The patient was provided an opportunity to ask questions and all were answered. The patient agreed with the plan and demonstrated an understanding of the instructions.   The patient was advised to call back or seek an in-person evaluation if the symptoms worsen or if the condition fails to improve as anticipated.    Collaboration of Care: Other provider involved in patient's care AEB notes are available in epic to review  Patient/Guardian was advised Release of Information must be obtained prior to any record release in order to collaborate their care with an outside provider. Patient/Guardian was advised if they have not already done so to contact the registration department to sign all necessary forms in  order for [place] to release information regarding their care.   Consent: Patient/Guardian gives verbal consent for treatment and assignment of benefits for services provided during this visit. Patient/Guardian expressed understanding and agreed to proceed.     I provided 32 minutes of non face to face time during this encounter.  Note: This document was prepared by [employer] voice dictation technology and any errors that results from this process are unintentional.    Cleotis Nipper, MD 03/09/2023

## 2023-03-19 ENCOUNTER — Other Ambulatory Visit (HOSPITAL_BASED_OUTPATIENT_CLINIC_OR_DEPARTMENT_OTHER): Payer: Self-pay

## 2023-03-21 ENCOUNTER — Other Ambulatory Visit (HOSPITAL_BASED_OUTPATIENT_CLINIC_OR_DEPARTMENT_OTHER): Payer: Self-pay

## 2023-03-21 ENCOUNTER — Encounter: Payer: Self-pay | Admitting: Physician Assistant

## 2023-03-21 DIAGNOSIS — K921 Melena: Secondary | ICD-10-CM

## 2023-03-21 DIAGNOSIS — R159 Full incontinence of feces: Secondary | ICD-10-CM

## 2023-03-27 DIAGNOSIS — K625 Hemorrhage of anus and rectum: Secondary | ICD-10-CM | POA: Diagnosis not present

## 2023-04-02 ENCOUNTER — Encounter (HOSPITAL_BASED_OUTPATIENT_CLINIC_OR_DEPARTMENT_OTHER): Payer: Self-pay

## 2023-04-02 ENCOUNTER — Ambulatory Visit (HOSPITAL_BASED_OUTPATIENT_CLINIC_OR_DEPARTMENT_OTHER)
Admission: RE | Admit: 2023-04-02 | Discharge: 2023-04-02 | Disposition: A | Discharge status: Home / Self Care | Payer: Commercial Managed Care - PPO | Source: Ambulatory Visit | Attending: Family Medicine | Admitting: Family Medicine

## 2023-04-02 DIAGNOSIS — Z1231 Encounter for screening mammogram for malignant neoplasm of breast: Secondary | ICD-10-CM | POA: Diagnosis not present

## 2023-04-03 ENCOUNTER — Other Ambulatory Visit: Payer: Self-pay

## 2023-04-03 ENCOUNTER — Encounter (HOSPITAL_BASED_OUTPATIENT_CLINIC_OR_DEPARTMENT_OTHER): Payer: Self-pay

## 2023-04-03 ENCOUNTER — Ambulatory Visit: Payer: Self-pay | Admitting: Physician Assistant

## 2023-04-03 ENCOUNTER — Emergency Department (HOSPITAL_BASED_OUTPATIENT_CLINIC_OR_DEPARTMENT_OTHER): Payer: Commercial Managed Care - PPO

## 2023-04-03 DIAGNOSIS — Z79890 Hormone replacement therapy (postmenopausal): Secondary | ICD-10-CM | POA: Insufficient documentation

## 2023-04-03 DIAGNOSIS — E039 Hypothyroidism, unspecified: Secondary | ICD-10-CM | POA: Diagnosis not present

## 2023-04-03 DIAGNOSIS — R519 Headache, unspecified: Secondary | ICD-10-CM | POA: Diagnosis not present

## 2023-04-03 LAB — CBC
HCT: 39.2 % (ref 36.0–46.0)
Hemoglobin: 13.2 g/dL (ref 12.0–15.0)
MCH: 30.8 pg (ref 26.0–34.0)
MCHC: 33.7 g/dL (ref 30.0–36.0)
MCV: 91.6 fL (ref 80.0–100.0)
Platelets: 182 10*3/uL (ref 150–400)
RBC: 4.28 MIL/uL (ref 3.87–5.11)
RDW: 13.2 % (ref 11.5–15.5)
WBC: 8.2 10*3/uL (ref 4.0–10.5)
nRBC: 0 % (ref 0.0–0.2)

## 2023-04-03 LAB — URINALYSIS, ROUTINE W REFLEX MICROSCOPIC
Bilirubin Urine: NEGATIVE
Glucose, UA: NEGATIVE mg/dL
Hgb urine dipstick: NEGATIVE
Ketones, ur: NEGATIVE mg/dL
Leukocytes,Ua: NEGATIVE
Nitrite: NEGATIVE
Protein, ur: NEGATIVE mg/dL
Specific Gravity, Urine: 1.02 (ref 1.005–1.030)
pH: 7 (ref 5.0–8.0)

## 2023-04-03 LAB — BASIC METABOLIC PANEL
Anion gap: 8 (ref 5–15)
BUN: 6 mg/dL (ref 6–20)
CO2: 23 mmol/L (ref 22–32)
Calcium: 9 mg/dL (ref 8.9–10.3)
Chloride: 106 mmol/L (ref 98–111)
Creatinine, Ser: 0.83 mg/dL (ref 0.44–1.00)
GFR, Estimated: >60 mL/min (ref >60)
Glucose, Bld: 110 mg/dL — ABNORMAL HIGH (ref 70–99)
Potassium: 3.6 mmol/L (ref 3.5–5.1)
Sodium: 137 mmol/L (ref 135–145)

## 2023-04-03 LAB — PREGNANCY, URINE: Preg Test, Ur: NEGATIVE

## 2023-04-03 NOTE — ED Triage Notes (Signed)
 Pt arrives with c/o headache that started around 3pm today. Pt reports blurry vision in right eye and dizziness when she closes both eyes. Pt took migraine medication without relief. Pt feels like she is talking slower than normal. Pt denies focal weakness, slurred speech, or facial droop.

## 2023-04-03 NOTE — Telephone Encounter (Signed)
 Copied from CRM [id number]. Topic: Clinical - Red Word Triage >> Apr 03, 2023  5:32 PM Delon DASEN wrote: Red Word that prompted transfer to Nurse Triage: Have a migraine on left side of head and now blurred vision in right eye   Chief Complaint: Blurred vision  Symptoms: Headache, blurred vision  Frequency: Constant  Disposition: [x] ED /[] Urgent Care (no appt availability in office) / [] Appointment(In office/virtual)/ []  [facility]/ [] Home Care/ [] Refused Recommended Disposition /[] [facility]/ []  Follow-up with PCP Additional Notes: Patient reports history of migraines. She reports she began to experience a migraine headache 2-3 hours ago. She states that she went home and laid down, and after getting back up she noticed blurred vision in her right eye, which she states is a new symptom. Patient advised that with the blurred vision that she should be evaluated in the Emergency Department to rule out stroke or other neurological diseases. She understood and states that her husband will take her to the ED.    Reason for Disposition  Loss of vision or double vision  (Exception: Same as prior migraines.)  Answer Assessment - Initial Assessment Questions 1. LOCATION: Where does it hurt?      Around left eye  2. ONSET: When did the headache start? (Minutes, hours or days)      2-3 hours  3. PATTERN: Does the pain come and go, or has it been constant since it started?     Constant  4. SEVERITY: How bad is the pain? and What does it keep you from doing?  (e.g., Scale 1-10; mild, moderate, or severe)   - MILD (1-3): doesn't interfere with normal activities    - MODERATE (4-7): interferes with normal activities or awakens from sleep    - SEVERE (8-10): excruciating pain, unable to do any normal activities        6/10 5. RECURRENT SYMPTOM: Have you ever had headaches before? If Yes, ask: When was the last time? and What happened that time?      Yes, history of  migraines  6. CAUSE: What do you think is causing the headache?     Unsure  7. MIGRAINE: Have you been diagnosed with migraine headaches? If Yes, ask: Is this headache similar?      Yes, similar  8. HEAD INJURY: Has there been any recent injury to the head?      No 9. OTHER SYMPTOMS: Do you have any other symptoms? (fever, stiff neck, eye pain, sore throat, cold symptoms)     Blurry vision in right eye, new for patient  10. PREGNANCY: Is there any chance you are pregnant? When was your last menstrual period?       No  Protocols used: Headache-A-AH

## 2023-04-04 ENCOUNTER — Emergency Department (HOSPITAL_BASED_OUTPATIENT_CLINIC_OR_DEPARTMENT_OTHER)
Admission: EM | Admit: 2023-04-04 | Discharge: 2023-04-04 | Disposition: A | Discharge status: Home / Self Care | Payer: Commercial Managed Care - PPO | Attending: Emergency Medicine | Admitting: Emergency Medicine

## 2023-04-04 DIAGNOSIS — R519 Headache, unspecified: Secondary | ICD-10-CM

## 2023-04-04 MED ORDER — PROCHLORPERAZINE EDISYLATE 10 MG/2ML IJ SOLN
10.0000 mg | Freq: Once | INTRAMUSCULAR | Status: AC
  Administered 2023-04-04: 10 mg via INTRAVENOUS
  Filled 2023-04-04: qty 2

## 2023-04-04 MED ORDER — KETOROLAC TROMETHAMINE 15 MG/ML IJ SOLN
15.0000 mg | Freq: Once | INTRAMUSCULAR | Status: AC
  Administered 2023-04-04: 15 mg via INTRAVENOUS
  Filled 2023-04-04: qty 1

## 2023-04-04 NOTE — Discharge Instructions (Signed)

## 2023-04-04 NOTE — ED Provider Notes (Signed)
 [facility] Provider Note   CSN: [id number] Arrival date & time: 04/03/23  1802     History  Chief Complaint  Patient presents with   Headache    Kathleen Phillips is a 48 y.o. female.  The history is provided by the patient.  Patient w/history of migraines presents with headache.  Patient reports around 3 PM on December 31 she began having left-sided headache that gradually worsened over 1 to 2 hours.  This started at rest  no fevers or vomiting she has had nausea.  No focal arm or leg weakness.  No slurred speech.  No numbness.  She reported some blurred vision in the right eye but that is improved.  No vision loss She did report some dizziness that is improving She gets headaches about once a month but typically improves with Maxalt   Tonights headache did not improve with medication   No previous history of CVA/brain tumor/aneurysm Past Medical History:  Diagnosis Date   Anemia    Anxiety    Constipation    Depression    Hypothyroidism    Migraines    Neurocardiogenic syncope    Palpitations    Sleep apnea    Swelling of lower extremity    Thyroid  disease    Vaginal Pap smear, abnormal     Home Medications Prior to Admission medications   Medication Sig Start Date End Date Taking? Authorizing Provider  busPIRone  (BUSPAR ) 15 MG tablet Take 1 tablet (15 mg total) by mouth 3 (three) times daily as needed. Patient taking differently: Take 15 mg by mouth as needed. 01/04/23   Breeback, Jade L, PA-C  cariprazine  (VRAYLAR ) 3 MG capsule Take 1 capsule (3 mg total) by mouth daily. 11/13/22   Bevin Bernice RAMAN, DO  cephALEXin  (KEFLEX ) 500 MG capsule Take 1 capsule (500 mg total) by mouth 2 (two) times daily. 01/05/23   Breeback, Jade L, PA-C  Cholecalciferol (VITAMIN D ) 50 MCG (2000 UT) tablet Take 2,000 Units by mouth daily.    [provider]  EPINEPHrine  0.3 mg/0.3 mL IJ SOAJ injection Inject 0.3 mg into the muscle as  needed for anaphylaxis. 12/15/20   Breeback, Jade L, PA-C  levocetirizine (XYZAL ) 5 MG tablet TAKE 1 TABLET (5 MG TOTAL) BY MOUTH EVERY EVENING. 07/05/22   Breeback, Jade L, PA-C  levothyroxine  (SYNTHROID ) 75 MCG tablet Take 1 tablet (75 mcg total) by mouth daily. 10/03/22   Breeback, Jade L, PA-C  meclizine  (ANTIVERT ) 25 MG tablet Take 1 tablet (25 mg total) by mouth 3 (three) times daily as needed for dizziness. 03/20/22   Willo Mini, NP  meloxicam  (MOBIC ) 15 MG tablet Take 1 tablet (15 mg total) by mouth daily. 01/26/23   Breeback, Jade L, PA-C  Multiple Vitamins-Minerals (MULTIVITAMIN WITH MINERALS) tablet Take 1 tablet by mouth daily.    [provider]  ondansetron  (ZOFRAN -ODT) 8 MG disintegrating tablet Take 1 tablet (8 mg total) by mouth every 8 (eight) hours as needed for nausea. 03/20/22   Willo Mini, NP  Phentermine -Topiramate  (QSYMIA ) 7.5-46 MG CP24 Take 1 capsule by mouth in the morning. 08/29/22   Delores Shields A, DO  rizatriptan  (MAXALT ) 10 MG tablet Take 1 tablet (10 mg total) by mouth as needed for migraine. May repeat in 2 hours if needed. 01/09/23   Breeback, Jade L, PA-C  traMADol  (ULTRAM ) 50 MG tablet Take 1-2 tablets (50-100 mg total) by mouth every 8 (eight) hours as needed for moderate pain. Maximum  6 tabs per day. 02/08/21   Curtis Debby PARAS, MD  valACYclovir  (VALTREX ) 500 MG tablet Take one tablet every 12 hours for 3 days for outbreaks. 07/02/20   Breeback, Jade L, PA-C  Vilazodone  HCl (VIIBRYD ) 40 MG TABS Take 1 tablet (40 mg total) by mouth daily. Patient not taking: Reported on 03/09/2023 11/13/22 11/13/23  Bevin Bernice RAMAN, DO  Vitamin D , Ergocalciferol , (DRISDOL ) 1.25 MG (50000 UNIT) CAPS capsule Take 1 capsule (50,000 Units total) by mouth every 7 (seven) days. Patient not taking: Reported on 02/09/2023 08/29/22   Delores Shields A, DO  vortioxetine  HBr (TRINTELLIX ) 20 MG TABS tablet Take 1 tablet (20 mg total) by mouth daily. 03/09/23 05/08/23  Curry Leni DASEN, MD       Allergies    Bupropion, Avocado, Ibuprofen, Other, and Tape    Review of Systems   Review of Systems  Constitutional:  Negative for fever.  Eyes:  Negative for pain, discharge and redness.       Blurred vision  Respiratory:  Negative for shortness of breath.   Cardiovascular:  Negative for chest pain.  Neurological:  Positive for headaches. Negative for speech difficulty and weakness.    Physical Exam Updated Vital Signs BP 117/82   Pulse 90   Temp 97.9 F (36.6 C) (Oral)   Resp 18   Wt 123 kg   SpO2 99%   BMI 41.23 kg/m  Physical Exam CONSTITUTIONAL: Well developed/well nourished HEAD: Normocephalic/atraumatic EYES: EOMI/PERRL, no nystagmus, no ptosis, normal fundoscopic exam (no papilledema) no corneal haziness, no conjunctival injection or discharge or lacrimation ENMT: Mucous membranes moist NECK: supple no meningeal signs, no bruits SPINE/BACK:entire spine nontender CV: S1/S2 noted, no murmurs/rubs/gallops noted LUNGS: Lungs are clear to auscultation bilaterally, no apparent distress NEURO:Awake/alert, face symmetric, no arm or leg drift is noted Equal 5/5 strength with shoulder abduction, elbow flex/extension, wrist flex/extension in upper extremities and equal hand grips bilaterally Equal 5/5 strength with hip flexion,knee flex/extension, foot dorsi/plantar flexion Cranial nerves 3/4/5/6/10/09/08/11/12 tested and intact Patient has been ambulatory No past pointing Sensation to light touch intact in all extremities EXTREMITIES: pulses normal, full ROM SKIN: warm, color normal PSYCH: no abnormalities of mood noted, alert and oriented to situation  ED Results / Procedures / Treatments   Labs (all labs ordered are listed, but only abnormal results are displayed) Labs Reviewed  BASIC METABOLIC PANEL - Abnormal; Notable for the following components:      Result Value   Glucose, Bld 110 (*)    All other components within normal limits  CBC  URINALYSIS, ROUTINE W  REFLEX MICROSCOPIC  PREGNANCY, URINE    EKG None  Radiology CT Head Wo Contrast Result Date: 04/03/2023 CLINICAL DATA:  Headache, new onset (Age >= 51y) EXAM: CT HEAD WITHOUT CONTRAST TECHNIQUE: Contiguous axial images were obtained from the base of the skull through the vertex without intravenous contrast. RADIATION DOSE REDUCTION: This exam was performed according to the departmental dose-optimization program which includes automated exposure control, adjustment of the mA and/or kV according to patient size and/or use of iterative reconstruction technique. COMPARISON:  Report from head CT 04/27/2019, images unavailable. FINDINGS: Brain: No intracranial hemorrhage, mass effect, or midline shift. No hydrocephalus. The basilar cisterns are patent. No evidence of territorial infarct or acute ischemia. No extra-axial or intracranial fluid collection. Vascular: No hyperdense vessel or unexpected calcification. Skull: Normal. Negative for fracture or focal lesion. Sinuses/Orbits: Paranasal sinuses and mastoid air cells are clear. The visualized orbits are unremarkable. Other: None.  IMPRESSION: Normal head CT. Electronically Signed   By: Andrea Gasman M.D.   On: 04/03/2023 21:00    Procedures Procedures    Medications Ordered in ED Medications  ketorolac  (TORADOL ) 15 MG/ML injection 15 mg (15 mg Intravenous Given 04/04/23 0110)  prochlorperazine  (COMPAZINE ) injection 10 mg (10 mg Intravenous Given 04/04/23 0111)    ED Course/ Medical Decision Making/ A&P Clinical Course as of 04/04/23 0149  Wed Apr 04, 2023  0148 Patient presents with left-sided headache that did not respond to her home medications.  She had no focal neurodeficits.  I have low suspicion for acute neurologic emergency  Patient responded well to medications and she is requesting discharge home [DW]    Clinical Course User Index [DW] Midge Golas, MD                                 Medical Decision Making Amount and/or  Complexity of Data Reviewed Labs: ordered.  Risk Prescription drug management.   This patient presents to the ED for concern of headache, this involves an extensive number of treatment options, and is a complaint that carries with it a high risk of complications and morbidity.  The differential diagnosis includes but is not limited to subarachnoid hemorrhage, intracranial hemorrhage, meningitis, encephalitis, CVST, temporal arteritis, idiopathic intracranial hypertension, migraine   Comorbidities that complicate the patient evaluation: Patient's presentation is complicated by their history of migraines  Lab Tests: I Ordered, and personally interpreted labs.  The pertinent results include: Labs unremarkable  Imaging Studies ordered: I ordered imaging studies including CT scan head   I independently visualized and interpreted imaging which showed no acute findings I agree with the radiologist interpretation  Medicines ordered and prescription drug management: I ordered medication including Compazine  and Toradol  for headache Reevaluation of the patient after these medicines showed that the patient    improved   Reevaluation: After the interventions noted above, I reevaluated the patient and found that they have :improved  Complexity of problems addressed: Patient's presentation is most consistent with  acute presentation with potential threat to life or bodily function  Disposition: After consideration of the diagnostic results and the patient's response to treatment,  I feel that the patent would benefit from discharge   .           Final Clinical Impression(s) / ED Diagnoses Final diagnoses:  Acute nonintractable headache, unspecified headache type    Rx / DC Orders ED Discharge Orders     None         Midge Golas, MD 04/04/23 [phone]

## 2023-04-30 ENCOUNTER — Other Ambulatory Visit: Payer: Self-pay

## 2023-04-30 ENCOUNTER — Other Ambulatory Visit: Payer: Self-pay | Admitting: Urology

## 2023-04-30 ENCOUNTER — Other Ambulatory Visit: Payer: Commercial Managed Care - PPO

## 2023-04-30 ENCOUNTER — Other Ambulatory Visit: Payer: Self-pay | Admitting: Physician Assistant

## 2023-04-30 ENCOUNTER — Other Ambulatory Visit (HOSPITAL_BASED_OUTPATIENT_CLINIC_OR_DEPARTMENT_OTHER): Payer: Self-pay

## 2023-04-30 DIAGNOSIS — R300 Dysuria: Secondary | ICD-10-CM | POA: Diagnosis not present

## 2023-04-30 DIAGNOSIS — N3000 Acute cystitis without hematuria: Secondary | ICD-10-CM | POA: Diagnosis not present

## 2023-04-30 LAB — MICROSCOPIC EXAMINATION: WBC, UA: >30 /[HPF] — AB (ref 0–5)

## 2023-04-30 LAB — URINALYSIS, ROUTINE W REFLEX MICROSCOPIC
Bilirubin, UA: NEGATIVE
Glucose, UA: NEGATIVE
Ketones, UA: NEGATIVE
Nitrite, UA: NEGATIVE
Protein,UA: NEGATIVE
Specific Gravity, UA: 1.01 (ref 1.005–1.030)
Urobilinogen, Ur: 0.2 mg/dL (ref 0.2–1.0)
pH, UA: 6.5 (ref 5.0–7.5)

## 2023-04-30 MED ORDER — NITROFURANTOIN MONOHYD MACRO 100 MG PO CAPS
100.0000 mg | ORAL_CAPSULE | Freq: Two times a day (BID) | ORAL | 0 refills | Status: AC
  Filled 2023-04-30: qty 14, 7d supply, fill #0

## 2023-05-03 LAB — URINE CULTURE

## 2023-05-04 ENCOUNTER — Other Ambulatory Visit (HOSPITAL_BASED_OUTPATIENT_CLINIC_OR_DEPARTMENT_OTHER): Payer: Self-pay

## 2023-05-04 MED ORDER — LEVOTHYROXINE SODIUM 75 MCG PO TABS
75.0000 ug | ORAL_TABLET | Freq: Every day | ORAL | 1 refills | Status: DC
  Filled 2023-05-04: qty 90, 90d supply, fill #0
  Filled 2023-08-28: qty 90, 90d supply, fill #1

## 2023-05-07 ENCOUNTER — Other Ambulatory Visit: Payer: Self-pay | Admitting: Urology

## 2023-05-07 ENCOUNTER — Other Ambulatory Visit (HOSPITAL_BASED_OUTPATIENT_CLINIC_OR_DEPARTMENT_OTHER): Payer: Self-pay

## 2023-05-07 DIAGNOSIS — N3001 Acute cystitis with hematuria: Secondary | ICD-10-CM

## 2023-05-07 MED ORDER — CEPHALEXIN 500 MG PO CAPS
500.0000 mg | ORAL_CAPSULE | Freq: Two times a day (BID) | ORAL | 0 refills | Status: DC
  Filled 2023-05-07: qty 14, 7d supply, fill #0

## 2023-05-11 ENCOUNTER — Encounter (HOSPITAL_COMMUNITY): Payer: Self-pay | Admitting: Psychiatry

## 2023-05-11 ENCOUNTER — Other Ambulatory Visit (HOSPITAL_BASED_OUTPATIENT_CLINIC_OR_DEPARTMENT_OTHER): Payer: Self-pay

## 2023-05-11 ENCOUNTER — Telehealth (HOSPITAL_BASED_OUTPATIENT_CLINIC_OR_DEPARTMENT_OTHER): Payer: Commercial Managed Care - PPO | Admitting: Psychiatry

## 2023-05-11 VITALS — Wt 235.0 lb

## 2023-05-11 DIAGNOSIS — F419 Anxiety disorder, unspecified: Secondary | ICD-10-CM

## 2023-05-11 DIAGNOSIS — F3181 Bipolar II disorder: Secondary | ICD-10-CM | POA: Diagnosis not present

## 2023-05-11 DIAGNOSIS — F5101 Primary insomnia: Secondary | ICD-10-CM | POA: Diagnosis not present

## 2023-05-11 MED ORDER — VORTIOXETINE HBR 20 MG PO TABS
20.0000 mg | ORAL_TABLET | Freq: Every day | ORAL | 2 refills | Status: DC
  Filled 2023-05-11 – 2023-06-24 (×2): qty 30, 30d supply, fill #0
  Filled 2023-08-13: qty 30, 30d supply, fill #1
  Filled 2023-08-28: qty 30, 30d supply, fill #2

## 2023-05-11 NOTE — Progress Notes (Signed)
 [facility] MD Virtual Progress Note   Patient Location: Work Provider Location: Home Office  I connect with patient by video and verified that I am speaking with correct person by using two identifiers. I discussed the limitations of evaluation and management by telemedicine and the availability of in person appointments. I also discussed with the patient that there may be a patient responsible charge related to this service. The patient expressed understanding and agreed to proceed.  Kathleen Phillips [phone] 48 y.o.  05/11/2023 11:44 AM  History of Present Illness:  Patient is evaluated by video session.  She is doing better on Trintellix .  She is no longer taking Viibryd  and once in a while she takes BuSpar  one fourth.  Overall she feels her anxiety is much better but sometimes she forgets to take the medication.  Patient told Christmas was difficult because has been was in the hospital for pneumonia and is still recovering from illness.  Patient also had severe UTI and migraine headaches and seen in the emergency room but now slowly and gradually getting better.  Patient is taking antibiotic.  She reported her appetite fluctuates but she is no longer taking weight loss medication.  She like to Trintellix  which is helping her mood, irritability, anger and depression.  She had not had time to contact therapy appointments which was given to her.  She denies any hallucination, paranoia, suicidal thoughts.  The job is going okay but sometimes very challenging.  She works as a CLINICAL BIOCHEMIST.  She denies any tremors, shakes or any EPS.  She wants to continue current medication.  She is getting Vraylar  from her primary care and still has enough refills that should last for at least 3 to 6 months.  Patient lives with her husband.  She has 2 older brother who lives 45 minutes away and daughter lives out of the town.  Patient was pleased because able to see the daughter on the Christmas.  Past  Psychiatric History: History of bipolar disorder.  No history of suicidal attempt but history of inpatient 10 years ago at [facility].  Had tried Celexa , Zoloft, Paxil, Remeron, BuSpar , hydroxyzine , Belsomra , risperidone .  Wellbutrin caused itching.  Lamictal did not work.  History of mania, excessive spending, anger, irritability.  Seen psychiatrist at [facility] lately medicines are given by primary care.  She tried Adderall  but after the psychological evaluation did not diagnosed with ADD and it was discontinued.  No history of drug use.    Outpatient Encounter Medications as of 05/11/2023  Medication Sig   cariprazine  (VRAYLAR ) 3 MG capsule Take 1 capsule (3 mg total) by mouth daily.   cephALEXin  (KEFLEX ) 500 MG capsule Take 1 capsule (500 mg total) by mouth 2 (two) times daily.   Cholecalciferol (VITAMIN D ) 50 MCG (2000 UT) tablet Take 2,000 Units by mouth daily.   EPINEPHrine  0.3 mg/0.3 mL IJ SOAJ injection Inject 0.3 mg into the muscle as needed for anaphylaxis.   levocetirizine (XYZAL ) 5 MG tablet TAKE 1 TABLET (5 MG TOTAL) BY MOUTH EVERY EVENING.   levothyroxine  (SYNTHROID ) 75 MCG tablet Take 1 tablet (75 mcg total) by mouth daily.   meclizine  (ANTIVERT ) 25 MG tablet Take 1 tablet (25 mg total) by mouth 3 (three) times daily as needed for dizziness.   meloxicam  (MOBIC ) 15 MG tablet Take 1 tablet (15 mg total) by mouth daily.   Multiple Vitamins-Minerals (MULTIVITAMIN WITH MINERALS) tablet Take 1 tablet by mouth daily.   ondansetron  (ZOFRAN -ODT) 8  MG disintegrating tablet Take 1 tablet (8 mg total) by mouth every 8 (eight) hours as needed for nausea.   Phentermine -Topiramate  (QSYMIA ) 7.5-46 MG CP24 Take 1 capsule by mouth in the morning. (Patient not taking: Reported on 05/11/2023)   rizatriptan  (MAXALT ) 10 MG tablet Take 1 tablet (10 mg total) by mouth as needed for migraine. May repeat in 2 hours if needed.   traMADol  (ULTRAM ) 50 MG tablet Take 1-2 tablets (50-100 mg  total) by mouth every 8 (eight) hours as needed for moderate pain. Maximum 6 tabs per day.   valACYclovir  (VALTREX ) 500 MG tablet Take one tablet every 12 hours for 3 days for outbreaks.   Vitamin D , Ergocalciferol , (DRISDOL ) 1.25 MG (50000 UNIT) CAPS capsule Take 1 capsule (50,000 Units total) by mouth every 7 (seven) days. (Patient not taking: Reported on 02/09/2023)   vortioxetine  HBr (TRINTELLIX ) 20 MG TABS tablet Take 1 tablet (20 mg total) by mouth daily.   [DISCONTINUED] busPIRone  (BUSPAR ) 15 MG tablet Take 1 tablet (15 mg total) by mouth 3 (three) times daily as needed. (Patient taking differently: Take 15 mg by mouth as needed.)   [DISCONTINUED] Vilazodone  HCl (VIIBRYD ) 40 MG TABS Take 1 tablet (40 mg total) by mouth daily. (Patient not taking: Reported on 03/09/2023)   [DISCONTINUED] vortioxetine  HBr (TRINTELLIX ) 20 MG TABS tablet Take 1 tablet (20 mg total) by mouth daily.   No facility-administered encounter medications on file as of 05/11/2023.    Recent Results (from the past 2160 hours)  Basic metabolic panel     Status: Abnormal   Collection Time: 04/03/23  6:16 PM  Result Value Ref Range   Sodium 137 135 - 145 mmol/L   Potassium 3.6 3.5 - 5.1 mmol/L   Chloride 106 98 - 111 mmol/L   CO2 23 22 - 32 mmol/L   Glucose, Bld 110 (H) 70 - 99 mg/dL    Comment: Glucose reference range applies only to samples taken after fasting for at least 8 hours.   BUN 6 6 - 20 mg/dL   Creatinine, Ser 9.16 0.44 - 1.00 mg/dL   Calcium 9.0 8.9 - 89.6 mg/dL   GFR, Estimated >39 >39 mL/min    Comment: (NOTE) Calculated using the CKD-EPI Creatinine Equation (2021)    Anion gap 8 5 - 15    Comment: Performed at [facility], [address] [place], [place] [address]  CBC     Status: None   Collection Time: 04/03/23  6:16 PM  Result Value Ref Range   WBC 8.2 4.0 - 10.5 K/uL   RBC 4.28 3.87 - 5.11 MIL/uL   Hemoglobin 13.2 12.0 - 15.0 g/dL   HCT 60.7 63.9 - 53.9 %   MCV 91.6 80.0 - 100.0  fL   MCH 30.8 26.0 - 34.0 pg   MCHC 33.7 30.0 - 36.0 g/dL   RDW 86.7 88.4 - 84.4 %   Platelets 182 150 - 400 K/uL   nRBC 0.0 0.0 - 0.2 %    Comment: Performed at [facility], [address] [place], [place] [address]  Urinalysis, Routine w reflex microscopic -Urine, Clean Catch     Status: None   Collection Time: 04/03/23  6:16 PM  Result Value Ref Range   Color, Urine YELLOW YELLOW   APPearance CLEAR CLEAR   Specific Gravity, Urine 1.020 1.005 - 1.030   pH 7.0 5.0 - 8.0   Glucose, UA NEGATIVE NEGATIVE mg/dL   Hgb urine dipstick  NEGATIVE NEGATIVE   Bilirubin Urine NEGATIVE NEGATIVE   Ketones, ur NEGATIVE NEGATIVE mg/dL   Protein, ur NEGATIVE NEGATIVE mg/dL   Nitrite NEGATIVE NEGATIVE   Leukocytes,Ua NEGATIVE NEGATIVE    Comment: Microscopic not done on urines with negative protein, blood, leukocytes, nitrite, or glucose < 500 mg/dL. Performed at [facility], [address] [place], [place] [address]   Pregnancy, urine     Status: None   Collection Time: 04/03/23  6:16 PM  Result Value Ref Range   Preg Test, Ur NEGATIVE NEGATIVE    Comment:        THE SENSITIVITY OF THIS METHODOLOGY IS >25 mIU/mL. Performed at [facility], [address] [place], [place] [address]   Urinalysis, Routine w reflex microscopic     Status: Abnormal   Collection Time: 04/30/23 10:14 AM  Result Value Ref Range   Specific Gravity, UA 1.010 1.005 - 1.030   pH, UA 6.5 5.0 - 7.5   Color, UA Yellow Yellow   Appearance Ur Hazy (A) Clear   Leukocytes,UA 1+ (A) Negative   Protein,UA Negative Negative/Trace   Glucose, UA Negative Negative   Ketones, UA Negative Negative   RBC, UA 2+ (A) Negative   Bilirubin, UA Negative Negative   Urobilinogen, Ur 0.2 0.2 - 1.0 mg/dL   Nitrite, UA Negative Negative   Microscopic Examination See below:   Microscopic Examination     Status: Abnormal   Collection Time: 04/30/23 10:14 AM   Urine  Result Value Ref Range   WBC, UA  >30 (A) 0 - 5 /hpf   RBC, Urine 0-2 0 - 2 /hpf   Epithelial Cells (non renal) 0-10 0 - 10 /hpf   Renal Epithel, UA Present (A) None seen /hpf   Mucus, UA Present (A) Not Estab.   Bacteria, UA Many (A) None seen/Few  Urine Culture     Status: Abnormal   Collection Time: 04/30/23 11:00 AM   Specimen: Urine, Clean Catch   UR  Result Value Ref Range   Urine Culture, Routine Final report (A)    Organism ID, Bacteria Proteus mirabilis (A)     Comment: Multi-Drug Resistant Organism Cefazolin <=4 ug/mL Cefazolin with an MIC <=16 predicts susceptibility to the oral agents cefaclor, cefdinir , cefpodoxime, cefprozil, cefuroxime, cephalexin , and loracarbef when used for therapy of uncomplicated urinary tract infections due to E. coli, Klebsiella pneumoniae, and Proteus mirabilis. Greater than 100,000 colony forming units per mL    Antimicrobial Susceptibility Comment     Comment:       ** S = Susceptible; I = Intermediate; R = Resistant **                    P = Positive; N = Negative             MICS are expressed in micrograms per mL    Antibiotic                 RSLT#1    RSLT#2    RSLT#3    RSLT#4 Amoxicillin /Clavulanic Acid    S Ampicillin                     S Cefepime                       S Ceftriaxone  S Cefuroxime                     S Ciprofloxacin                   S Ertapenem                      S Gentamicin                     S Levofloxacin                   S Meropenem                      S Nitrofurantoin                  R Piperacillin/Tazobactam        S Tetracycline                   R Tobramycin                     S Trimethoprim/Sulfa             R      Psychiatric Specialty Exam: Physical Exam  Review of Systems  Weight 235 lb (106.6 kg).There is no height or weight on file to calculate BMI.  General Appearance: Well Groomed  Eye Contact:  Good  Speech:  Clear and Coherent  Volume:  Normal  Mood:  Anxious  Affect:  Congruent  Thought  Process:  Goal Directed  Orientation:  Full (Time, Place, and Person)  Thought Content:  Logical  Suicidal Thoughts:  No  Homicidal Thoughts:  No  Memory:  Immediate;   Good Recent;   Good Remote;   Good  Judgement:  Good  Insight:  Present  Psychomotor Activity:  Normal  Concentration:  Concentration: Good and Attention Span: Good  Recall:  Good  Fund of Knowledge:  Good  Language:  Good  Akathisia:  No  Handed:  Right  AIMS (if indicated):     Assets:  Communication Skills Desire for Improvement Financial Resources/Insurance Housing Resilience Talents/Skills Transportation  ADL's:  Intact  Cognition:  WNL  Sleep:  ok     Assessment/Plan: Mixed bipolar II disorder (HCC) - Plan: vortioxetine  HBr (TRINTELLIX ) 20 MG TABS tablet  Primary insomnia - Plan: vortioxetine  HBr (TRINTELLIX ) 20 MG TABS tablet  Anxiety - Plan: vortioxetine  HBr (TRINTELLIX ) 20 MG TABS tablet  Patient doing better with Trintellix .  Recommend to discontinue BuSpar  as patient taking only half tablet as needed which does not work as it supposed to take on a regular basis.  She feels overall her anxiety is manageable.  Continue Trintellix  20 mg daily.  She is getting weight loss from her primary care and still has refills remaining.  Currently she is busy taking care of her husband and also recovering from severe UTI.  Patient will consider reaching out to therapy if she needed and had time to do therapy.  Discussed medication side effects and benefits.  Recommended to call us  back if she is any question or any concern.  Follow-up in 3 months.   Follow Up Instructions:     I discussed the assessment and treatment plan with the patient. The patient was provided an opportunity to ask questions and all were answered. The patient agreed with the plan and demonstrated an understanding of the instructions.  The patient was advised to call back or seek an in-person evaluation if the symptoms worsen or if the  condition fails to improve as anticipated.    Collaboration of Care: Other provider involved in patient's care AEB notes are available in epic to review  Patient/Guardian was advised Release of Information must be obtained prior to any record release in order to collaborate their care with an outside provider. Patient/Guardian was advised if they have not already done so to contact the registration department to sign all necessary forms in order for us  to release information regarding their care.   Consent: Patient/Guardian gives verbal consent for treatment and assignment of benefits for services provided during this visit. Patient/Guardian expressed understanding and agreed to proceed.     I provided 18 minutes of non face to face time during this encounter.  Note: This document was prepared by [employer] voice dictation technology and any errors that results from this process are unintentional.    Leni ONEIDA Client, MD 05/11/2023

## 2023-05-15 ENCOUNTER — Other Ambulatory Visit (HOSPITAL_BASED_OUTPATIENT_CLINIC_OR_DEPARTMENT_OTHER): Payer: Self-pay

## 2023-05-15 MED ORDER — PREDNISONE 10 MG PO TABS
ORAL_TABLET | ORAL | 0 refills | Status: AC
Start: 2023-05-15 — End: 2023-05-22
  Filled 2023-05-15: qty 21, 6d supply, fill #0

## 2023-05-25 ENCOUNTER — Encounter: Payer: Self-pay | Admitting: Physician Assistant

## 2023-05-25 ENCOUNTER — Ambulatory Visit: Payer: Commercial Managed Care - PPO | Admitting: Physician Assistant

## 2023-05-25 ENCOUNTER — Other Ambulatory Visit (HOSPITAL_BASED_OUTPATIENT_CLINIC_OR_DEPARTMENT_OTHER): Payer: Self-pay

## 2023-05-25 ENCOUNTER — Other Ambulatory Visit: Payer: Self-pay

## 2023-05-25 VITALS — BP 107/75 | HR 69 | Ht 68.0 in | Wt 235.5 lb

## 2023-05-25 DIAGNOSIS — J320 Chronic maxillary sinusitis: Secondary | ICD-10-CM | POA: Diagnosis not present

## 2023-05-25 DIAGNOSIS — F332 Major depressive disorder, recurrent severe without psychotic features: Secondary | ICD-10-CM | POA: Diagnosis not present

## 2023-05-25 DIAGNOSIS — G4730 Sleep apnea, unspecified: Secondary | ICD-10-CM | POA: Insufficient documentation

## 2023-05-25 DIAGNOSIS — Z6835 Body mass index (BMI) 35.0-35.9, adult: Secondary | ICD-10-CM | POA: Diagnosis not present

## 2023-05-25 DIAGNOSIS — E66812 Obesity, class 2: Secondary | ICD-10-CM | POA: Diagnosis not present

## 2023-05-25 DIAGNOSIS — J3089 Other allergic rhinitis: Secondary | ICD-10-CM

## 2023-05-25 DIAGNOSIS — H9202 Otalgia, left ear: Secondary | ICD-10-CM | POA: Diagnosis not present

## 2023-05-25 DIAGNOSIS — G4733 Obstructive sleep apnea (adult) (pediatric): Secondary | ICD-10-CM

## 2023-05-25 DIAGNOSIS — G43009 Migraine without aura, not intractable, without status migrainosus: Secondary | ICD-10-CM

## 2023-05-25 MED ORDER — MELOXICAM 15 MG PO TABS
15.0000 mg | ORAL_TABLET | Freq: Every day | ORAL | 3 refills | Status: DC
  Filled 2023-05-25 – 2024-01-23 (×2): qty 90, 90d supply, fill #0

## 2023-05-25 MED ORDER — LEVOCETIRIZINE DIHYDROCHLORIDE 5 MG PO TABS
5.0000 mg | ORAL_TABLET | Freq: Every evening | ORAL | 3 refills | Status: AC
  Filled 2023-05-25: qty 90, fill #0
  Filled 2023-07-03 – 2023-11-27 (×3): qty 90, 90d supply, fill #0
  Filled 2024-02-23: qty 90, 90d supply, fill #1

## 2023-05-25 MED ORDER — AMOXICILLIN-POT CLAVULANATE 875-125 MG PO TABS
1.0000 | ORAL_TABLET | Freq: Two times a day (BID) | ORAL | 0 refills | Status: DC
  Filled 2023-05-25: qty 20, 10d supply, fill #0

## 2023-05-25 MED ORDER — ZEPBOUND 2.5 MG/0.5ML ~~LOC~~ SOAJ
2.5000 mg | SUBCUTANEOUS | 0 refills | Status: DC
  Filled 2023-05-25 – 2023-07-04 (×3): qty 2, 28d supply, fill #0

## 2023-05-25 MED ORDER — TOPIRAMATE 100 MG PO TABS
100.0000 mg | ORAL_TABLET | Freq: Every day | ORAL | 3 refills | Status: DC
  Filled 2023-05-25: qty 90, 90d supply, fill #0
  Filled 2023-08-28: qty 90, 90d supply, fill #1
  Filled 2023-10-07 – 2023-10-08 (×2): qty 90, 90d supply, fill #2
  Filled 2024-01-23: qty 90, 90d supply, fill #3
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
## 2023-05-28 ENCOUNTER — Other Ambulatory Visit (HOSPITAL_BASED_OUTPATIENT_CLINIC_OR_DEPARTMENT_OTHER): Payer: Self-pay

## 2023-06-04 ENCOUNTER — Other Ambulatory Visit (HOSPITAL_BASED_OUTPATIENT_CLINIC_OR_DEPARTMENT_OTHER): Payer: Self-pay

## 2023-06-04 ENCOUNTER — Other Ambulatory Visit: Payer: Self-pay

## 2023-06-04 MED ORDER — NA SULFATE-K SULFATE-MG SULF 17.5-3.13-1.6 GM/177ML PO SOLN
ORAL | 0 refills | Status: DC
  Filled 2023-06-04: qty 354, 1d supply, fill #0

## 2023-06-05 ENCOUNTER — Encounter: Payer: Self-pay | Admitting: Physician Assistant

## 2023-06-06 ENCOUNTER — Other Ambulatory Visit (HOSPITAL_BASED_OUTPATIENT_CLINIC_OR_DEPARTMENT_OTHER): Payer: Self-pay

## 2023-06-06 MED ORDER — FLUCONAZOLE 150 MG PO TABS
150.0000 mg | ORAL_TABLET | Freq: Once | ORAL | 0 refills | Status: AC
  Filled 2023-06-06: qty 2, 2d supply, fill #0

## 2023-06-25 ENCOUNTER — Other Ambulatory Visit (HOSPITAL_BASED_OUTPATIENT_CLINIC_OR_DEPARTMENT_OTHER): Payer: Self-pay

## 2023-07-04 ENCOUNTER — Other Ambulatory Visit (HOSPITAL_BASED_OUTPATIENT_CLINIC_OR_DEPARTMENT_OTHER): Payer: Self-pay

## 2023-07-20 ENCOUNTER — Other Ambulatory Visit: Payer: Self-pay

## 2023-07-20 ENCOUNTER — Other Ambulatory Visit (HOSPITAL_BASED_OUTPATIENT_CLINIC_OR_DEPARTMENT_OTHER): Payer: Self-pay

## 2023-07-30 ENCOUNTER — Other Ambulatory Visit (HOSPITAL_BASED_OUTPATIENT_CLINIC_OR_DEPARTMENT_OTHER): Payer: Self-pay

## 2023-08-08 ENCOUNTER — Telehealth (HOSPITAL_BASED_OUTPATIENT_CLINIC_OR_DEPARTMENT_OTHER): Payer: Commercial Managed Care - PPO | Admitting: Psychiatry

## 2023-08-08 ENCOUNTER — Encounter (HOSPITAL_COMMUNITY): Payer: Self-pay

## 2023-08-08 DIAGNOSIS — Z91199 Patient's noncompliance with other medical treatment and regimen due to unspecified reason: Secondary | ICD-10-CM

## 2023-08-08 NOTE — Progress Notes (Signed)
 Patient is no-show on video platform.  I also called cell phone and left a voicemail to schedule appointment.

## 2023-08-13 ENCOUNTER — Encounter: Payer: Self-pay | Admitting: Medical-Surgical

## 2023-08-13 ENCOUNTER — Ambulatory Visit (INDEPENDENT_AMBULATORY_CARE_PROVIDER_SITE_OTHER): Admitting: Medical-Surgical

## 2023-08-13 ENCOUNTER — Other Ambulatory Visit (HOSPITAL_BASED_OUTPATIENT_CLINIC_OR_DEPARTMENT_OTHER): Payer: Self-pay

## 2023-08-13 VITALS — BP 109/75 | HR 72 | Resp 20 | Ht 68.0 in | Wt 240.0 lb

## 2023-08-13 DIAGNOSIS — M62838 Other muscle spasm: Secondary | ICD-10-CM

## 2023-08-13 MED ORDER — METHOCARBAMOL 500 MG PO TABS
500.0000 mg | ORAL_TABLET | Freq: Three times a day (TID) | ORAL | 0 refills | Status: AC
  Filled 2023-08-13: qty 90, 30d supply, fill #0

## 2023-08-13 MED ORDER — METHYLPREDNISOLONE 4 MG PO TBPK
ORAL_TABLET | ORAL | 0 refills | Status: DC
  Filled 2023-08-13: qty 21, 6d supply, fill #0

## 2023-08-13 NOTE — Progress Notes (Unsigned)
   Established patient visit  History, exam, impression, and plan:  1. Cough, unspecified type 2. Sore throat Kathleen Phillips 48 year old female presenting today with reports of upper respiratory symptoms.  Notes that this started approximately 5 days ago with her nose and eyes burning.  On Saturday, she felt unwell and by Sunday she notes that she felt awful.  Her throat has been sore and she has been coughing.  Notes that her cough has been occasionally productive of small amounts of pink-tinged sputum, usually in the morning.  Her left ear has now developed pressure/discomfort and she has significant sinus congestion.  She has tried drinking hot tea and increasing her fluid consumption.  She has also used Chloraseptic for the sore throat.  Continues to have significant issues with hoarseness.  She saw ENT and they recommended just using Atrovent nasal spray twice daily and follow-up with them after a couple of months.  POCT strep, flu, and COVID testing all negative today.  Below for physical exam. - POCT rapid strep A - POCT Influenza A/B - POC COVID-19  3. Viral URI with cough Despite negative testing, suspect that her symptoms are truly related to a viral URI.  Discussed the timeline for resolution of a viral illness since symptoms can last 7 to 14 days.  With her severe hoarseness and significant sinus congestion, treating with Decadron 4 mg twice daily.  Adding Tussionex for cough suppression.  Okay to use Tylenol/ibuprofen for fever/discomfort.  Continue conservative measures at home.  If improvement in symptoms not noted over the next 2 to 3 days or symptoms improved but quickly worsen again, consider adding antibiotic therapy for secondary bacterial infection.   Procedures performed this visit: None.  Return if symptoms worsen or fail to improve.  __________________________________ Thayer Ohm, DNP, APRN, FNP-BC Primary Care and Sports Medicine [facility] [place]

## 2023-08-14 ENCOUNTER — Encounter: Payer: Self-pay | Admitting: Medical-Surgical

## 2023-08-15 ENCOUNTER — Encounter: Payer: Self-pay | Admitting: Physician Assistant

## 2023-08-15 ENCOUNTER — Ambulatory Visit: Admitting: Physician Assistant

## 2023-08-15 ENCOUNTER — Other Ambulatory Visit (HOSPITAL_BASED_OUTPATIENT_CLINIC_OR_DEPARTMENT_OTHER): Payer: Self-pay

## 2023-08-15 VITALS — BP 115/58 | HR 71 | Ht 68.0 in | Wt 234.0 lb

## 2023-08-15 DIAGNOSIS — E66812 Obesity, class 2: Secondary | ICD-10-CM | POA: Diagnosis not present

## 2023-08-15 DIAGNOSIS — M7062 Trochanteric bursitis, left hip: Secondary | ICD-10-CM | POA: Diagnosis not present

## 2023-08-15 DIAGNOSIS — Z6835 Body mass index (BMI) 35.0-35.9, adult: Secondary | ICD-10-CM | POA: Diagnosis not present

## 2023-08-15 DIAGNOSIS — G8929 Other chronic pain: Secondary | ICD-10-CM | POA: Insufficient documentation

## 2023-08-15 DIAGNOSIS — M7061 Trochanteric bursitis, right hip: Secondary | ICD-10-CM

## 2023-08-15 DIAGNOSIS — M5442 Lumbago with sciatica, left side: Secondary | ICD-10-CM

## 2023-08-15 DIAGNOSIS — E6609 Other obesity due to excess calories: Secondary | ICD-10-CM

## 2023-08-15 HISTORY — DX: Lumbago with sciatica, left side: M54.42

## 2023-08-15 HISTORY — DX: Trochanteric bursitis, right hip: M70.61

## 2023-08-15 HISTORY — DX: Other chronic pain: G89.29

## 2023-08-15 MED ORDER — TRAMADOL HCL 50 MG PO TABS
50.0000 mg | ORAL_TABLET | Freq: Two times a day (BID) | ORAL | 0 refills | Status: AC | PRN
  Filled 2023-08-15: qty 60, 15d supply, fill #0

## 2023-08-15 NOTE — Patient Instructions (Addendum)
 Start medrol  dose pack Continue robaxin as needed Consider tens unit and heating pad Icy hot patches Tramadol  for breakthrough pain Will order xrays    Low Back Sprain or Strain Rehab Ask your health care provider which exercises are safe for you. Do exercises exactly as told by your health care provider and adjust them as directed. It is normal to feel mild stretching, pulling, tightness, or discomfort as you do these exercises. Stop right away if you feel sudden pain or your pain gets worse. Do not begin these exercises until told by your health care provider. Stretching and range-of-motion exercises These exercises warm up your muscles and joints and improve the movement and flexibility of your back. These exercises also help to relieve pain, numbness, and tingling. Lumbar rotation  Lie on your back on a firm bed or the floor with your knees bent. Straighten your arms out to your sides so each arm forms a 90-degree angle (right angle) with a side of your body. Slowly move (rotate) both of your knees to one side of your body until you feel a stretch in your lower back (lumbar). Try not to let your shoulders lift off the floor. Hold this position for __________ seconds. Tense your abdominal muscles and slowly move your knees back to the starting position. Repeat this exercise on the other side of your body. Repeat __________ times. Complete this exercise __________ times a day. Single knee to chest  Lie on your back on a firm bed or the floor with both legs straight. Bend one of your knees. Use your hands to move your knee up toward your chest until you feel a gentle stretch in your lower back and buttock. Hold your leg in this position by holding on to the front of your knee. Keep your other leg as straight as possible. Hold this position for __________ seconds. Slowly return to the starting position. Repeat with your other leg. Repeat __________ times. Complete this exercise  __________ times a day. Prone extension on elbows  Lie on your abdomen on a firm bed or the floor (prone position). Prop yourself up on your elbows. Use your arms to help lift your chest up until you feel a gentle stretch in your abdomen and your lower back. This will place some of your body weight on your elbows. If this is uncomfortable, try stacking pillows under your chest. Your hips should stay down, against the surface that you are lying on. Keep your hip and back muscles relaxed. Hold this position for __________ seconds. Slowly relax your upper body and return to the starting position. Repeat __________ times. Complete this exercise __________ times a day. Strengthening exercises These exercises build strength and endurance in your back. Endurance is the ability to use your muscles for a long time, even after they get tired. Pelvic tilt This exercise strengthens the muscles that lie deep in the abdomen. Lie on your back on a firm bed or the floor with your legs extended. Bend your knees so they are pointing toward the ceiling and your feet are flat on the floor. Tighten your lower abdominal muscles to press your lower back against the floor. This motion will tilt your pelvis so your tailbone points up toward the ceiling instead of pointing to your feet or the floor. To help with this exercise, you may place a small towel under your lower back and try to push your back into the towel. Hold this position for __________ seconds. Let your muscles  relax completely before you repeat this exercise. Repeat __________ times. Complete this exercise __________ times a day. Alternating arm and leg raises  Get on your hands and knees on a firm surface. If you are on a hard floor, you may want to use padding, such as an exercise mat, to cushion your knees. Line up your arms and legs. Your hands should be directly below your shoulders, and your knees should be directly below your hips. Lift your  left leg behind you. At the same time, raise your right arm and straighten it in front of you. Do not lift your leg higher than your hip. Do not lift your arm higher than your shoulder. Keep your abdominal and back muscles tight. Keep your hips facing the ground. Do not arch your back. Keep your balance carefully, and do not hold your breath. Hold this position for __________ seconds. Slowly return to the starting position. Repeat with your right leg and your left arm. Repeat __________ times. Complete this exercise __________ times a day. Abdominal set with straight leg raise  Lie on your back on a firm bed or the floor. Bend one of your knees and keep your other leg straight. Tense your abdominal muscles and lift your straight leg up, 4-6 inches (10-15 cm) off the ground. Keep your abdominal muscles tight and hold this position for __________ seconds. Do not hold your breath. Do not arch your back. Keep it flat against the ground. Keep your abdominal muscles tense as you slowly lower your leg back to the starting position. Repeat with your other leg. Repeat __________ times. Complete this exercise __________ times a day. Single leg lower with bent knees Lie on your back on a firm bed or the floor. Tense your abdominal muscles and lift your feet off the floor, one foot at a time, so your knees and hips are bent in 90-degree angles (right angles). Your knees should be over your hips and your lower legs should be parallel to the floor. Keeping your abdominal muscles tense and your knee bent, slowly lower one of your legs so your toe touches the ground. Lift your leg back up to return to the starting position. Do not hold your breath. Do not let your back arch. Keep your back flat against the ground. Repeat with your other leg. Repeat __________ times. Complete this exercise __________ times a day. Posture and body mechanics Good posture and healthy body mechanics can help to relieve  stress in your body's tissues and joints. Body mechanics refers to the movements and positions of your body while you do your daily activities. Posture is part of body mechanics. Good posture means: Your spine is in its natural S-curve position (neutral). Your shoulders are pulled back slightly. Your head is not tipped forward (neutral). Follow these guidelines to improve your posture and body mechanics in your everyday activities. Standing  When standing, keep your spine neutral and your feet about hip-width apart. Keep a slight bend in your knees. Your ears, shoulders, and hips should line up. When you do a task in which you stand in one place for a long time, place one foot up on a stable object that is 2-4 inches (5-10 cm) high, such as a footstool. This helps keep your spine neutral. Sitting  When sitting, keep your spine neutral and keep your feet flat on the floor. Use a footrest, if necessary, and keep your thighs parallel to the floor. Avoid rounding your shoulders, and avoid tilting your  head forward. When working at a desk or a computer, keep your desk at a height where your hands are slightly lower than your elbows. Slide your chair under your desk so you are close enough to maintain good posture. When working at a computer, place your monitor at a height where you are looking straight ahead and you do not have to tilt your head forward or downward to look at the screen. Resting When lying down and resting, avoid positions that are most painful for you. If you have pain with activities such as sitting, bending, stooping, or squatting, lie in a position in which your body does not bend very much. For example, avoid curling up on your side with your arms and knees near your chest (fetal position). If you have pain with activities such as standing for a long time or reaching with your arms, lie with your spine in a neutral position and bend your knees slightly. Try the following  positions: Lying on your side with a pillow between your knees. Lying on your back with a pillow under your knees. Lifting  When lifting objects, keep your feet at least shoulder-width apart and tighten your abdominal muscles. Bend your knees and hips and keep your spine neutral. It is important to lift using the strength of your legs, not your back. Do not lock your knees straight out. Always ask for help to lift heavy or awkward objects. This information is not intended to replace advice given to you by your health care provider. Make sure you discuss any questions you have with your health care provider. Document Revised: 07/24/2022 Document Reviewed: 06/07/2020 Elsevier Patient Education  2024 [employer].
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
## 2023-08-29 ENCOUNTER — Other Ambulatory Visit: Payer: Self-pay

## 2023-08-30 ENCOUNTER — Ambulatory Visit

## 2023-08-30 DIAGNOSIS — M5137 Other intervertebral disc degeneration, lumbosacral region with discogenic back pain only: Secondary | ICD-10-CM | POA: Diagnosis not present

## 2023-08-30 DIAGNOSIS — M5442 Lumbago with sciatica, left side: Secondary | ICD-10-CM

## 2023-08-30 DIAGNOSIS — G8929 Other chronic pain: Secondary | ICD-10-CM

## 2023-08-30 DIAGNOSIS — M4807 Spinal stenosis, lumbosacral region: Secondary | ICD-10-CM | POA: Diagnosis not present

## 2023-08-30 DIAGNOSIS — M545 Low back pain, unspecified: Secondary | ICD-10-CM

## 2023-09-03 ENCOUNTER — Encounter (HOSPITAL_COMMUNITY): Payer: Self-pay | Admitting: *Deleted

## 2023-09-10 ENCOUNTER — Ambulatory Visit: Payer: Self-pay | Admitting: Physician Assistant

## 2023-09-10 DIAGNOSIS — M51369 Other intervertebral disc degeneration, lumbar region without mention of lumbar back pain or lower extremity pain: Secondary | ICD-10-CM | POA: Insufficient documentation

## 2023-09-10 DIAGNOSIS — M5136 Other intervertebral disc degeneration, lumbar region with discogenic back pain only: Secondary | ICD-10-CM

## 2023-09-10 HISTORY — DX: Other intervertebral disc degeneration, lumbar region without mention of lumbar back pain or lower extremity pain: M51.369

## 2023-09-10 NOTE — Progress Notes (Signed)
 L5-S1 narrowing and spurring. Physical therapy and Dr. Elva Hamburger for consideration of injections could be option if pain persist.

## 2023-09-12 ENCOUNTER — Telehealth (HOSPITAL_COMMUNITY): Payer: Self-pay

## 2023-09-12 DIAGNOSIS — F419 Anxiety disorder, unspecified: Secondary | ICD-10-CM

## 2023-09-12 DIAGNOSIS — F5101 Primary insomnia: Secondary | ICD-10-CM

## 2023-09-12 DIAGNOSIS — F3181 Bipolar II disorder: Secondary | ICD-10-CM

## 2023-09-12 NOTE — Telephone Encounter (Signed)
 Patient is calling because the only time she has availability to see you is on Wednesday afternoons and you are not available. Patient said she needs a follow up and she needs refills. She would like to transfer to a doctor that can see her Wednesday afternoon. Please review and advise, thank you

## 2023-09-13 ENCOUNTER — Other Ambulatory Visit (HOSPITAL_BASED_OUTPATIENT_CLINIC_OR_DEPARTMENT_OTHER): Payer: Self-pay

## 2023-09-13 MED ORDER — VORTIOXETINE HBR 20 MG PO TABS
20.0000 mg | ORAL_TABLET | Freq: Every day | ORAL | 0 refills | Status: DC
  Filled 2023-09-13 – 2023-09-26 (×2): qty 30, 30d supply, fill #0

## 2023-09-13 NOTE — Telephone Encounter (Signed)
 I provided a 30-day prescription to her pharmacy.  We can schedule with the [facility]/Dr Sharalyn Dasen if they are available.

## 2023-09-24 ENCOUNTER — Telehealth: Payer: Self-pay | Admitting: Sports Medicine
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
## 2023-09-25 ENCOUNTER — Ambulatory Visit (INDEPENDENT_AMBULATORY_CARE_PROVIDER_SITE_OTHER): Admitting: Physician Assistant

## 2023-09-25 ENCOUNTER — Other Ambulatory Visit (HOSPITAL_BASED_OUTPATIENT_CLINIC_OR_DEPARTMENT_OTHER): Payer: Self-pay

## 2023-09-25 DIAGNOSIS — Z23 Encounter for immunization: Secondary | ICD-10-CM

## 2023-09-25 NOTE — Progress Notes (Signed)
Need for tdap.

## 2023-09-26 ENCOUNTER — Other Ambulatory Visit (HOSPITAL_BASED_OUTPATIENT_CLINIC_OR_DEPARTMENT_OTHER): Payer: Self-pay

## 2023-10-03 ENCOUNTER — Telehealth (HOSPITAL_COMMUNITY): Admitting: Family

## 2023-10-03 ENCOUNTER — Other Ambulatory Visit (HOSPITAL_BASED_OUTPATIENT_CLINIC_OR_DEPARTMENT_OTHER): Payer: Self-pay

## 2023-10-03 DIAGNOSIS — F3181 Bipolar II disorder: Secondary | ICD-10-CM | POA: Diagnosis not present

## 2023-10-03 DIAGNOSIS — F419 Anxiety disorder, unspecified: Secondary | ICD-10-CM

## 2023-10-03 DIAGNOSIS — F5101 Primary insomnia: Secondary | ICD-10-CM | POA: Diagnosis not present

## 2023-10-03 MED ORDER — VORTIOXETINE HBR 20 MG PO TABS
20.0000 mg | ORAL_TABLET | Freq: Every day | ORAL | 0 refills | Status: DC
  Filled 2023-10-03: qty 30, 30d supply, fill #0

## 2023-10-03 NOTE — Progress Notes (Signed)
 Virtual Visit via Video Note  I connected with Kathleen Phillips on 10/03/23 at  4:00 PM EDT by a video enabled telemedicine application and verified that I am speaking with the correct person using two identifiers.  Location: Patient: Home Provider: Office   I discussed the limitations of evaluation and management by telemedicine and the availability of in person appointments. The patient expressed understanding and agreed to proceed.    I discussed the assessment and treatment plan with the patient. The patient was provided an opportunity to ask questions and all were answered. The patient agreed with the plan and demonstrated an understanding of the instructions.   The patient was advised to call back or seek an in-person evaluation if the symptoms worsen or if the condition fails to improve as anticipated.  I provided 15 minutes of non-face-to-face time during this encounter.   Staci LOISE Kerns, NP   [facility] MD/PA/NP OP Progress Note  10/03/2023 4:05 PM Laurana Magistro  MRN:  [id number]  Chief Complaint: Medication management follow-up   HPI: Kathleen  Phillips 48 year old female presents for medication management follow-up appointment.  Seen and evaluated via [employer] virtual platform. was previously followed by psychiatrist Arfeen.  She carries a diagnosis related to major depressive disorder, generalized anxiety disorder, bipolar mixed and insomnia disorder.  She reports she is currently prescribed Vraylar  3 mg daily which is prescribed by her primary care provider.  States taking Trintellix  20 mg daily which she reports she has been taking and tolerating well.  Denying any medication side effects.  States overall her mood is stabilized.  Rating her depression 3 out of 10 with 10 being the worst and 4 out of 10 with anxiety reported symptoms.  States she has plans to follow-up with counseling  Nathanel Collet, LCSW through [facility] health [facility].  She reports declining health and her  significant other.  States she was previously seen by Nathanel states she has a pretty good report.  Reports a good appetite.  States she is resting well throughout the night.  Tabbitha reported she recently had a job changed.  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  States her only availability is Wednesday afternoons.  Denies any issues with illicit drug use or substance abuse history.  Chart reviewed previous inpatient admission for 15 years prior.  No history related to this.  Just presented with suicidal thoughts.  Support encouragement and reassurance was provided will make medications available x 2 months.     Visit Diagnosis:    ICD-10-CM   1. Mixed bipolar II disorder (HCC)  F31.81 vortioxetine  HBr (TRINTELLIX ) 20 MG TABS tablet    2. Primary insomnia  F51.01 vortioxetine  HBr (TRINTELLIX ) 20 MG TABS tablet    3. Anxiety  F41.9 vortioxetine  HBr (TRINTELLIX ) 20 MG TABS tablet      Past Psychiatric History:   Past Medical History:  Past Medical History:  Diagnosis Date   Anemia    Anxiety    Constipation    Depression    Hypothyroidism    Migraines    Neurocardiogenic syncope    Palpitations    Sleep apnea    Swelling of lower extremity    Thyroid  disease    Vaginal Pap smear, abnormal     Past Surgical History:  Procedure Laterality Date   CERVICAL CONE BIOPSY     ENDOMETRIAL ABLATION     KNEE SURGERY     MENISCUS REPAIR      Family Psychiatric  History:   Family History:  Family History  Problem Relation Age of Onset   Hypertension Mother    Heart attack Mother    Diabetes Mother    Skin cancer Mother    Heart disease Mother    Sudden death Mother    Kidney disease Mother    Lung cancer Father    Kidney disease Father    Heart attack Maternal Grandmother     Social History:  Social History   Socioeconomic History   Marital status: Married    Spouse name: Not on file   Number of children: Not on file   Years of  education: Not on file   Highest education level: Some college, no degree  Occupational History   Not on file  Tobacco Use   Smoking status: Never   Smokeless tobacco: Never  Substance and Sexual Activity   Alcohol use: Never   Drug use: Never   Sexual activity: Yes    Birth control/protection: Surgical    Comment: vasectomy  Other Topics Concern   Not on file  Social History Narrative   Not on file   Social Drivers of Health   Financial Resource Strain: Low Risk  (05/24/2023)   Overall Financial Resource Strain (CARDIA)    Difficulty of Paying Living Expenses: Not hard at all  Food Insecurity: No Food Insecurity (05/24/2023)   Hunger Vital Sign    Worried About Running Out of Food in the Last Year: Never true    Ran Out of Food in the Last Year: Never true  Transportation Needs: No Transportation Needs (05/24/2023)   PRAPARE - Administrator, Civil Service (Medical): No    Lack of Transportation (Non-Medical): No  Physical Activity: Sufficiently Active (05/24/2023)   Exercise Vital Sign    Days of Exercise per Week: 5 days    Minutes of Exercise per Session: 30 min  Stress: Stress Concern Present (05/24/2023)   [employer] of Occupational Health - Occupational Stress Questionnaire    Feeling of Stress : To some extent  Social Connections: Moderately Integrated (05/24/2023)   Social Connection and Isolation Panel    Frequency of Communication with Friends and Family: Never    Frequency of Social Gatherings with Friends and Family: Never    Attends Religious Services: More than 4 times per year    Active Member of [employer] or Organizations: Yes    Attends Banker Meetings: 1 to 4 times per year    Marital Status: Married  Recent Concern: Social Connections - Socially Isolated (03/24/2023)   Received from [employer]   Social Network    How would you rate your social network (family, work, friends)?: Little participation, lonely and socially  isolated    Allergies:  Allergies  Allergen Reactions   Bupropion Itching and Other (See Comments)    Other reaction(s): Other Hands itch Hands itch    Avocado Nausea And Vomiting   Ibuprofen Other (See Comments)    Palpation    Other Other (See Comments)    Berries   Tape     Red/sore skin    Metabolic Disorder Labs: Lab Results  Component Value Date   HGBA1C 5.2 08/14/2022   No results found for: PROLACTIN Lab Results  Component Value Date   CHOL 162 11/13/2022   TRIG 124 11/13/2022   HDL 51 11/13/2022   CHOLHDL 3.2 11/13/2022   LDLCALC 89 11/13/2022   LDLCALC 77 08/14/2022   Lab Results  Component Value Date   TSH 3.930 10/02/2022   TSH 6.170 (H) 08/14/2022    Therapeutic Level Labs: No results found for: LITHIUM No results found for: VALPROATE No results found for: CBMZ  Current Medications: Current Outpatient Medications  Medication Sig Dispense Refill   cariprazine  (VRAYLAR ) 3 MG capsule Take 1 capsule (3 mg total) by mouth daily. 90 capsule 3   Cholecalciferol (VITAMIN D ) 50 MCG (2000 UT) tablet Take 2,000 Units by mouth daily.     EPINEPHrine  0.3 mg/0.3 mL IJ SOAJ injection Inject 0.3 mg into the muscle as needed for anaphylaxis. 1 each 11   levocetirizine (XYZAL ) 5 MG tablet Take 1 tablet (5 mg total) by mouth every evening. 90 tablet 3   levothyroxine  (SYNTHROID ) 75 MCG tablet Take 1 tablet (75 mcg total) by mouth daily. 90 tablet 1   meloxicam  (MOBIC ) 15 MG tablet Take 1 tablet (15 mg total) by mouth daily. 90 tablet 3   methocarbamol  (ROBAXIN ) 500 MG tablet Take 1 tablet (500 mg total) by mouth 3 (three) times daily. 90 tablet 0   methylPREDNISolone  (MEDROL  DOSEPAK) 4 MG TBPK tablet Take as directed. 21 tablet 0   Multiple Vitamins-Minerals (MULTIVITAMIN WITH MINERALS) tablet Take 1 tablet by mouth daily.     Na Sulfate-K Sulfate-Mg Sulfate concentrate (SUPREP) 17.5-3.13-1.6 GM/177ML SOLN Take 177 mLs by mouth 2 (two) times. Follow  instructions provided by DHS, DO NOT follow instructions on box. 354 mL 0   ondansetron  (ZOFRAN -ODT) 8 MG disintegrating tablet Take 1 tablet (8 mg total) by mouth every 8 (eight) hours as needed for nausea. 20 tablet 3   rizatriptan  (MAXALT ) 10 MG tablet Take 1 tablet (10 mg total) by mouth as needed for migraine. May repeat in 2 hours if needed. 10 tablet 0   tirzepatide  (ZEPBOUND ) 2.5 MG/0.5ML Pen Inject 2.5 mg into the skin once a week. 2 mL 0   topiramate  (TOPAMAX ) 100 MG tablet Take 1 tablet (100 mg total) by mouth daily. 90 tablet 3   traMADol  (ULTRAM ) 50 MG tablet Take 1-2 tablets (50-100 mg total) by mouth every 12 (twelve) hours as needed for moderate pain (pain score 4-6). Maximum 6 tabs per day. 60 tablet 0   valACYclovir  (VALTREX ) 500 MG tablet Take one tablet every 12 hours for 3 days for outbreaks. 36 tablet 1   vortioxetine  HBr (TRINTELLIX ) 20 MG TABS tablet Take 1 tablet (20 mg total) by mouth daily. 30 tablet 0   No current facility-administered medications for this visit.     Musculoskeletal: Virtual assessment   Psychiatric Specialty Exam: Review of Systems  There were no vitals taken for this visit.There is no height or weight on file to calculate BMI.  General Appearance: Casual  Eye Contact:  Good  Speech:  Clear and Coherent  Volume:  Normal  Mood:  Anxious and Depressed  Affect:  Congruent  Thought Process:  Coherent  Orientation:  Full (Time, Place, and Person)  Thought Content: Logical   Suicidal Thoughts:  No  Homicidal Thoughts:  No  Memory:  Immediate;   Good Recent;   Good  Judgement:  Good  Insight:  Good  Psychomotor Activity:  Normal  Concentration:  Concentration: Good  Recall:  Good  Fund of Knowledge: Good  Language: Good  Akathisia:  No  Handed:  Right  AIMS (if indicated): not done  Assets:  Communication Skills Desire for Improvement  ADL's:  Intact  Cognition: WNL  Sleep:  Good   Screenings: GAD-7  Flowsheet Row Office  [facility] Office Visit from 11/13/2022 in [facility] & Sports Medicine at [facility] Office Visit from 05/31/2022 in [facility] Primary Care & Sports Medicine at [facility] Office Visit from 11/16/2021 in [facility] Primary Care & Sports Medicine at [facility] Office Visit from 04/08/2021 in [facility] Primary Care & Sports Medicine at [facility]  Total GAD-7 Score 6 14 9  0 8   PHQ2-9    Flowsheet Row Office Visit from 02/09/2023 in [facility] Office Visit from 11/13/2022 in [facility] Primary Care & Sports Medicine at [facility] Office Visit from 05/31/2022 in [facility] Primary Care & Sports Medicine at [facility] Office Visit from 11/16/2021 in [facility] Primary Care & Sports Medicine at [facility] Counselor from 09/12/2021 in [facility] Behavioral Medicine at [facility]  PHQ-2 Total Score 4 3 3 1 2   PHQ-9 Total Score 15 14 14 6 8    Flowsheet Row ED from 04/04/2023 in [facility] Emergency Department at [facility] Office Visit from 02/09/2023 in [facility] from 08/18/2022 in [facility] Emergency Department at [facility]  C-SSRS RISK CATEGORY No Risk No Risk No Risk     Assessment and Plan: Rosa Bohall 48 year old Caucasian female presents for follow-up appointment.  Previously followed by psychiatrist Arfeen for medication management.  States she was prescribed Trintellix  20 mg daily which she reports she has been taking and tolerating well.  Reports her primary care provider prescribed Vraylar  3 mg daily no medication side effects noted.  Does report ongoing stressors related to husband's declining health.  States she has plans to follow-up with LCSW for counseling services.  Patient to follow-up 2 months for  medication management.  Support encouragement reassurance was provided  Collaboration of Care: Collaboration of Care: Other provider involved in patient's care AEB therapy services [facility] LCSW.   Patient/Guardian was advised Release of Information must be obtained prior to any record release in order to collaborate their care with an outside provider. Patient/Guardian was advised if they have not already done so to contact the registration department to sign all necessary forms in order for us  to release information regarding their care.   Consent: Patient/Guardian gives verbal consent for treatment and assignment of benefits for services provided during this visit. Patient/Guardian expressed understanding and agreed to proceed.    Staci LOISE Kerns, NP 10/03/2023, 4:05 PM

## 2023-10-08 ENCOUNTER — Other Ambulatory Visit: Payer: Self-pay

## 2023-10-08 ENCOUNTER — Other Ambulatory Visit (HOSPITAL_BASED_OUTPATIENT_CLINIC_OR_DEPARTMENT_OTHER): Payer: Self-pay

## 2023-10-21 ENCOUNTER — Other Ambulatory Visit: Payer: Self-pay

## 2023-10-22 ENCOUNTER — Other Ambulatory Visit (HOSPITAL_BASED_OUTPATIENT_CLINIC_OR_DEPARTMENT_OTHER): Payer: Self-pay

## 2023-10-22 ENCOUNTER — Other Ambulatory Visit: Payer: Self-pay | Admitting: Family Medicine

## 2023-10-22 DIAGNOSIS — F3181 Bipolar II disorder: Secondary | ICD-10-CM

## 2023-10-22 MED ORDER — CARIPRAZINE HCL 3 MG PO CAPS
3.0000 mg | ORAL_CAPSULE | Freq: Every day | ORAL | 3 refills | Status: AC
  Filled 2023-10-22 – 2024-01-23 (×2): qty 90, 90d supply, fill #0
  Filled 2024-04-20: qty 90, 90d supply, fill #1

## 2023-10-24 ENCOUNTER — Ambulatory Visit: Admitting: Professional

## 2023-10-24 ENCOUNTER — Encounter: Payer: Self-pay | Admitting: Professional

## 2023-10-24 DIAGNOSIS — F5101 Primary insomnia: Secondary | ICD-10-CM

## 2023-10-24 DIAGNOSIS — F3181 Bipolar II disorder: Secondary | ICD-10-CM

## 2023-10-24 DIAGNOSIS — F419 Anxiety disorder, unspecified: Secondary | ICD-10-CM | POA: Diagnosis not present

## 2023-10-24 LAB — LAB REPORT - SCANNED
A1c: 5.2
EGFR: 84

## 2023-10-24 NOTE — Progress Notes (Signed)
   Kathleen Phillips, [facility]
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
## 2023-10-25 ENCOUNTER — Other Ambulatory Visit (HOSPITAL_BASED_OUTPATIENT_CLINIC_OR_DEPARTMENT_OTHER): Payer: Self-pay

## 2023-10-31 ENCOUNTER — Ambulatory Visit: Admitting: Professional

## 2023-11-01 ENCOUNTER — Ambulatory Visit: Admitting: Professional

## 2023-11-14 ENCOUNTER — Encounter: Payer: Self-pay | Admitting: Professional

## 2023-11-14 ENCOUNTER — Ambulatory Visit: Admitting: Professional

## 2023-11-14 ENCOUNTER — Other Ambulatory Visit (HOSPITAL_BASED_OUTPATIENT_CLINIC_OR_DEPARTMENT_OTHER): Payer: Self-pay

## 2023-11-14 DIAGNOSIS — F3181 Bipolar II disorder: Secondary | ICD-10-CM

## 2023-11-14 DIAGNOSIS — F5101 Primary insomnia: Secondary | ICD-10-CM

## 2023-11-14 DIAGNOSIS — F419 Anxiety disorder, unspecified: Secondary | ICD-10-CM

## 2023-11-14 MED ORDER — ONDANSETRON 4 MG PO TBDP
4.0000 mg | ORAL_TABLET | Freq: Two times a day (BID) | ORAL | 0 refills | Status: DC | PRN
  Filled 2023-11-14: qty 10, 5d supply, fill #0

## 2023-11-14 NOTE — Progress Notes (Signed)
   Kathleen Phillips, [facility]

## 2023-11-14 NOTE — Progress Notes (Deleted)
   Risk Assessment: Danger to Self:  {PSY:22692} Self-injurious Behavior: {PSY:22692} Danger to Others: {PSY:22692} Duty to Warn:{PSY:311194} Physical Aggression / Violence:{PSY:21197} Access to Firearms a concern: {PSY:21197} Gang Involvement:{PSY:21197}  Subjective: ***   Diagnoses:  No diagnosis found.   -next session will be Wednesday, October 31, 2023 at 3pm.

## 2023-11-14 NOTE — Progress Notes (Signed)
 [facility] Counselor/Therapist Progress Note  Patient ID: Kathleen Phillips, MRN: [id number],    Date: 11/14/2023  Time Spent: 56 minutes 304-4pm   Treatment Type: Individual Therapy  Risk Assessment: Danger to Self:  No Self-injurious Behavior: No Danger to Others: No  Subjective: This session was held via video teletherapy. The patient consented to video teletherapy and was located in her car for this session. She is aware it is the responsibility of the patient to secure confidentiality on her end of the session. The provider was in a private home office for the duration of this session.    The patient arrived on time for her Caregility appointment.  Issues Addressed: 1-Treatment Planning a-goals pt wishes to address   -how to control my stress that I don't overeat, I eat my feelings   -communication with my daughter   -dealing with all the issues with my daughter's childhood and all that   -figuring out how to deal with issues as an adult so I can have my granddaughter in my life   -the things I'm missing out on because she has a sick husband; she cries about it and sometimes feels butt hurt b-patient and Clinician developed treatment plan during session -patient fully participated and agrees with treatment plan  Treatment Plan Problems: Balancing Work and Family/Multiple Roles, Caregiving of Aging Parents, Menopause and Perimenopause Symptoms: Experiences stress associated with balancing the multiple tasks associated with motherhood and work roles. Experiences role conflict between two or more roles (i.e., obligations or responsibilities for one role interfere with ability to perform duties for other roles). Describes stress and anxiety associated with cumulative demands of multiple roles. Experiences fatigue and difficulty concentrating (e.g., feeling overwhelmed) due to role overload. Experiences partner relationship strain and dissatisfaction due  to inequitable division of responsibilities. Exhibits anxiety and grief related to change in parent-child dynamics due to assuming caregiving responsibilities for parents. Experiences stress and frustration associated with caregiving responsibilities. Experiences psychological distress associated with unfinished business as it relates to aging parent(s) (e.g., parent-child conflict, sibling relationships, familial role expectations). Demonstrates difficulty managing caregiving with existing roles and responsibilities (i.e., home, family, and work). Experiences physiological indicators of menopause (e.g., hot flashes, heavy and/or irregular periods, vaginal dryness, night sweats, heart palpitations, migraine headaches, weight gain, fibroids, change in libido, urinary symptoms, breast swelling/tenderness, insomnia, skin changes). Internalizes and verbalizes negative attitudes and a lowered self-esteem related to menopause and aging. Evidences a loss of interest in sex or romantic relationships. Goals: Maintain a balance between the multiple demands of motherhood, work, and other life roles and responsibilities. Develop a social support network for self and aging parent(s). Develop a balance between caregiving responsibilities and other roles, responsibilities, and interests. Resolve associated grief and anxiety and adapt to new caregiving role and life circumstances. Resolve the psychological effects of menopause, including depression, anxiety, and body image concerns. Adapt to the normal physical changes that occur with the onset and course of menopause. Objectives target date for all objectives is 11/13/2024: Describe role and responsibilities associated with work and family and related thoughts, feelings, and behaviors. Implement assertiveness skills and limit setting. Learn and implement stress management and relaxation techniques to reduce fatigue, anxiety, and depressive symptoms. Communicate  needs with partner regarding multiple role obligations. Protect each role from interfering with the other. Identify and participate in theme support groups. Commit to a healthy eating, sleeping, and exercise routine. Describe family history and details of current caretaking situation. Develop effective communication skills and boundaries  that will facilitate a harmonious relationship and caregiving of aging parent. Increase social support contacts to reduce isolation and stress related to new role expectations. Implement stress management practices aimed at a restoration of physical and emotional balance. Identify coping strategies to effectively process inevitable loss of parent(s). Differentiate between which midlife symptoms are due to perimenopause/ menopause and which may simply be concurrent developmental changes. Implement at least two strategies to relieve insomnia. Implement at least two strategies to help reduce hot flashes. Intervention: Refer the client to read books such as The Wisdom of Menopause  by Lonzo, The Sexy Years by Donelda, and a book of essays called The Ageless Spirit: Reflections on Living Life to the Fullest in Midlife and the Years Beyond by [place]. Discuss with the client her process of self-assessment and reexamination and the positive implications for the future (e.g., achieved more experience, competence, and greater freedom). Explore options (e.g., supplements/herbs, oral contraceptives, HRT) for reducing hot flashes, which can adversely affect sleep and performing daily tasks. Educate the client on the relationship between insomnia/loss of sleep and its impact on overall cognitive functioning (e.g., concentration, memory, clarity of thought) and emotional functioning (e.g., irritability, depressive symptoms). Encourage the client to implement new sleep induction habits (e.g., exercise early in the day, take a warm bath before retiring, drink chamomile tea, read,  engage in meditation/yoga). Encourage the client to eat a healthy diet that is low in fat and cholesterol; moderate in total fat; high in fiber; high in fruits, vegetables, and whole-grain foods; and well-balanced in vitamins and minerals, including calcium. Assist the client in planning an exercise/yoga/medication routine that will be beneficial in reducing depressive symptoms, anxiety, and stress, as well as the risks of osteoporosis, heart disease, and weight gain/body image issues associated with menopause. Explore with the client her multiple roles and responsibilities associated with work and family; clarify related thoughts and feelings. Explore with the client her expectations regarding the balance between work and family, and discuss how this relates to internalized gender role stereotypes. Assist the client in identifying the causes and consequences of at least two internal and two external expectations associated with balancing work and family roles. Recommend that the client read and implement programs from Exercising Your Way to Better Mental Health by Noe. Explore potential self-care activities and encourage the client's participation (or assign Identify and Schedule Pleasant Activities in the Adult Psychotherapy Homework Planner, 2nd ed. by Jenniffer). Use behavioral techniques (i.e., education, modeling, role-playing, corrective feedback, positive reinforcement) to teach communication skills, including assertive communication, offering positive feedback, active listening, making positive requests of others for behavior change, and giving negative feedback in an honest and respectful manner. Explore, along with the client, strategies for making her life more manageable and less stressful (e.g., waking up before the children to exercise or have a cup of tea, pack the children's lunches and backpacks the night before). Encourage the client to develop a schedule of roles and responsibilities  with her partner; assign her to meet regularly with her partner to discuss, review, and revise the schedule as needed. Document the client's family history, including age, health, and relationship dynamics of her parent(s), sibling(s), child(ren), and partner(s). Explore the details of the client's caregiving duties for her parents. Assist the client with generating a realistic and effective time management schedule. Assist the client in developing an awareness of automatic thoughts that reflect unrealistic expectations regarding caring for aging parents. Do behavioral experiments in which unrealistic automatic thoughts are treated as hypotheses/predictions,  reality-based alternative hypotheses/predictions are generated, and both are tested against the client's past, present, and/or future experiences. Reinforce the client's positive, reality-based cognitive messages that enhance self-confidence and increase adaptive action (see Positive Self-Talk in the Adult Psychotherapy Homework Planner, 2nd ed. by Jenniffer).  Diagnosis:Mixed bipolar II disorder (HCC)  Anxiety  Primary insomnia  Plan:  -meet again on Wednesday, November 28, 2023 at [employer].

## 2023-11-21 ENCOUNTER — Encounter: Payer: Self-pay | Admitting: Physician Assistant

## 2023-11-21 ENCOUNTER — Ambulatory Visit (INDEPENDENT_AMBULATORY_CARE_PROVIDER_SITE_OTHER): Admitting: Physician Assistant

## 2023-11-21 VITALS — BP 122/67 | HR 84 | Ht 68.0 in | Wt 232.0 lb

## 2023-11-21 DIAGNOSIS — E66812 Obesity, class 2: Secondary | ICD-10-CM

## 2023-11-21 DIAGNOSIS — Z0000 Encounter for general adult medical examination without abnormal findings: Secondary | ICD-10-CM

## 2023-11-21 DIAGNOSIS — E7800 Pure hypercholesterolemia, unspecified: Secondary | ICD-10-CM | POA: Diagnosis not present

## 2023-11-21 DIAGNOSIS — E6609 Other obesity due to excess calories: Secondary | ICD-10-CM

## 2023-11-21 DIAGNOSIS — G43009 Migraine without aura, not intractable, without status migrainosus: Secondary | ICD-10-CM | POA: Diagnosis not present

## 2023-11-21 DIAGNOSIS — R7989 Other specified abnormal findings of blood chemistry: Secondary | ICD-10-CM | POA: Diagnosis not present

## 2023-11-21 DIAGNOSIS — E559 Vitamin D deficiency, unspecified: Secondary | ICD-10-CM | POA: Diagnosis not present

## 2023-11-21 DIAGNOSIS — R790 Abnormal level of blood mineral: Secondary | ICD-10-CM | POA: Diagnosis not present

## 2023-11-21 DIAGNOSIS — Z6835 Body mass index (BMI) 35.0-35.9, adult: Secondary | ICD-10-CM

## 2023-11-23 ENCOUNTER — Other Ambulatory Visit (INDEPENDENT_AMBULATORY_CARE_PROVIDER_SITE_OTHER)

## 2023-11-23 ENCOUNTER — Ambulatory Visit: Payer: Self-pay | Admitting: Physician Assistant

## 2023-11-23 ENCOUNTER — Other Ambulatory Visit (HOSPITAL_BASED_OUTPATIENT_CLINIC_OR_DEPARTMENT_OTHER): Payer: Self-pay

## 2023-11-23 DIAGNOSIS — R300 Dysuria: Secondary | ICD-10-CM

## 2023-11-23 DIAGNOSIS — E7800 Pure hypercholesterolemia, unspecified: Secondary | ICD-10-CM

## 2023-11-23 DIAGNOSIS — N3001 Acute cystitis with hematuria: Secondary | ICD-10-CM

## 2023-11-23 HISTORY — DX: Pure hypercholesterolemia, unspecified: E78.00

## 2023-11-23 LAB — POCT URINALYSIS DIP (CLINITEK)
Bilirubin, UA: NEGATIVE
Glucose, UA: NEGATIVE mg/dL
Ketones, POC UA: NEGATIVE mg/dL
Nitrite, UA: NEGATIVE
POC PROTEIN,UA: NEGATIVE
Spec Grav, UA: 1.015 (ref 1.010–1.025)
Urobilinogen, UA: 0.2 U/dL
pH, UA: 7 (ref 5.0–8.0)

## 2023-11-23 MED ORDER — RIZATRIPTAN BENZOATE 10 MG PO TABS
10.0000 mg | ORAL_TABLET | ORAL | 5 refills | Status: AC | PRN
  Filled 2023-11-23: qty 10, 15d supply, fill #0

## 2023-11-23 MED ORDER — NITROFURANTOIN MONOHYD MACRO 100 MG PO CAPS
100.0000 mg | ORAL_CAPSULE | Freq: Two times a day (BID) | ORAL | 0 refills | Status: DC
  Filled 2023-11-23: qty 10, 5d supply, fill #0

## 2023-11-23 MED ORDER — LEVOTHYROXINE SODIUM 75 MCG PO TABS
75.0000 ug | ORAL_TABLET | Freq: Every day | ORAL | 1 refills | Status: AC
  Filled 2023-11-23: qty 90, 90d supply, fill #0
  Filled 2024-03-24: qty 90, 90d supply, fill #1

## 2023-11-23 NOTE — Addendum Note (Signed)
 Addended by: ANTONIETTE VERMELL CROME on: 11/23/2023 08:25 AM   Modules accepted: Orders

## 2023-11-23 NOTE — Progress Notes (Signed)
..   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 No fever, chills, body aches, flank pain, nausea, or vomiting Moderate leuks and trace blood Will culture Start macrobid  for 5 days Symptomatic care discussed Follow up as needed

## 2023-11-23 NOTE — Progress Notes (Signed)
 Sent macrobid  to [facility].
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
## 2023-11-27 ENCOUNTER — Other Ambulatory Visit: Payer: Self-pay

## 2023-11-27 ENCOUNTER — Other Ambulatory Visit: Payer: Self-pay | Admitting: Physician Assistant

## 2023-11-27 ENCOUNTER — Other Ambulatory Visit (HOSPITAL_BASED_OUTPATIENT_CLINIC_OR_DEPARTMENT_OTHER): Payer: Self-pay

## 2023-11-27 ENCOUNTER — Encounter (HOSPITAL_COMMUNITY): Payer: Self-pay | Admitting: *Deleted

## 2023-11-27 LAB — URINE CULTURE

## 2023-11-27 MED ORDER — EPINEPHRINE 0.3 MG/0.3ML IJ SOAJ
0.3000 mg | INTRAMUSCULAR | 11 refills | Status: AC | PRN
  Filled 2023-11-27: qty 2, 1d supply, fill #0

## 2023-11-27 MED ORDER — CIPROFLOXACIN HCL 500 MG PO TABS
500.0000 mg | ORAL_TABLET | Freq: Two times a day (BID) | ORAL | 0 refills | Status: AC
  Filled 2023-11-27: qty 20, 10d supply, fill #0

## 2023-11-27 NOTE — Progress Notes (Signed)
 Sent cipro . Macrobid  is resistant.

## 2023-11-27 NOTE — Addendum Note (Signed)
 Addended by: ANTONIETTE VERMELL CROME on: 11/27/2023 01:46 PM   Modules accepted: Orders

## 2023-11-28 ENCOUNTER — Ambulatory Visit: Admitting: Professional

## 2023-11-28 ENCOUNTER — Other Ambulatory Visit (HOSPITAL_COMMUNITY): Payer: Self-pay

## 2023-11-28 ENCOUNTER — Other Ambulatory Visit (HOSPITAL_BASED_OUTPATIENT_CLINIC_OR_DEPARTMENT_OTHER): Payer: Self-pay

## 2023-11-28 ENCOUNTER — Other Ambulatory Visit (HOSPITAL_COMMUNITY): Payer: Self-pay | Admitting: Family

## 2023-11-28 ENCOUNTER — Other Ambulatory Visit: Payer: Self-pay

## 2023-11-28 DIAGNOSIS — F3181 Bipolar II disorder: Secondary | ICD-10-CM

## 2023-11-28 DIAGNOSIS — F419 Anxiety disorder, unspecified: Secondary | ICD-10-CM

## 2023-11-28 DIAGNOSIS — F5101 Primary insomnia: Secondary | ICD-10-CM

## 2023-11-28 MED ORDER — VORTIOXETINE HBR 20 MG PO TABS
20.0000 mg | ORAL_TABLET | Freq: Every day | ORAL | 1 refills | Status: DC
Start: 2023-11-28 — End: 2024-01-07
  Filled 2023-11-28: qty 30, 30d supply, fill #0
  Filled 2023-12-24: qty 30, 30d supply, fill #1

## 2023-11-28 MED ORDER — VORTIOXETINE HBR 20 MG PO TABS
20.0000 mg | ORAL_TABLET | Freq: Every day | ORAL | 0 refills | Status: DC
Start: 2023-11-28 — End: 2023-11-28
  Filled 2023-11-28: qty 30, 30d supply, fill #0

## 2023-11-28 NOTE — Progress Notes (Signed)
 Medication was refilled. Patient to keep outpatient follow-up apt

## 2023-12-04 ENCOUNTER — Encounter: Payer: Self-pay | Admitting: Sports Medicine

## 2023-12-07 ENCOUNTER — Ambulatory Visit (INDEPENDENT_AMBULATORY_CARE_PROVIDER_SITE_OTHER): Admitting: Physician Assistant

## 2023-12-07 ENCOUNTER — Encounter: Payer: Self-pay | Admitting: Physician Assistant

## 2023-12-07 ENCOUNTER — Other Ambulatory Visit (HOSPITAL_BASED_OUTPATIENT_CLINIC_OR_DEPARTMENT_OTHER): Payer: Self-pay

## 2023-12-07 VITALS — BP 114/78 | HR 79 | Ht 68.0 in | Wt 232.0 lb

## 2023-12-07 DIAGNOSIS — K921 Melena: Secondary | ICD-10-CM | POA: Diagnosis not present

## 2023-12-07 DIAGNOSIS — K648 Other hemorrhoids: Secondary | ICD-10-CM

## 2023-12-07 DIAGNOSIS — Z87440 Personal history of urinary (tract) infections: Secondary | ICD-10-CM

## 2023-12-07 HISTORY — DX: Personal history of urinary (tract) infections: Z87.440

## 2023-12-07 HISTORY — DX: Other hemorrhoids: K64.8

## 2023-12-07 LAB — POCT URINALYSIS DIP (CLINITEK)
Bilirubin, UA: NEGATIVE
Blood, UA: NEGATIVE
Glucose, UA: NEGATIVE mg/dL
Ketones, POC UA: NEGATIVE mg/dL
Leukocytes, UA: NEGATIVE
Nitrite, UA: NEGATIVE
POC PROTEIN,UA: NEGATIVE
Spec Grav, UA: 1.02 (ref 1.010–1.025)
Urobilinogen, UA: 0.2 U/dL
pH, UA: 7 (ref 5.0–8.0)

## 2023-12-07 MED ORDER — HYDROCORTISONE ACETATE 25 MG RE SUPP
25.0000 mg | Freq: Two times a day (BID) | RECTAL | 11 refills | Status: AC | PRN
  Filled 2023-12-07: qty 24, 12d supply, fill #0

## 2023-12-07 NOTE — Progress Notes (Signed)
 Acute Office Visit  Subjective:     Patient ID: Kathleen Phillips, female    DOB: 1975/05/01, 48 y.o.   MRN: [id number]  No chief complaint on file.   HPI Patient is in today for bright red blood in stool for 5 days. She has had one other few day history of this. She denies any rectal pain. Her stools are soft and daily. She denies any mucus. She is not straining. She had a colonoscopy when she was 42, 5 years ago and no polyps were seen but she did have internal hemorrhoids. She denies any abdominal pain, nausea, vomiting, flank pain. She has not tried anything to make better. She is a Engineer, civil (consulting) and has not seen any external hemorrhoids.   She is on her last day of antibiotic for UTI. She is not having any urinary symptoms.   ROS See HPI>      Objective:    BP 114/78   Pulse 79   Ht 5' 8 (1.727 m)   Wt 232 lb (105.2 kg)   SpO2 99%   BMI 35.28 kg/m  BP Readings from Last 3 Encounters:  12/07/23 114/78  11/21/23 122/67  08/15/23 (!) 115/58   Wt Readings from Last 3 Encounters:  12/07/23 232 lb (105.2 kg)  11/21/23 232 lb (105.2 kg)  08/15/23 234 lb (106.1 kg)      Physical Exam Constitutional:      Appearance: Normal appearance. She is obese.  HENT:     Head: Normocephalic.  Cardiovascular:     Rate and Rhythm: Normal rate.  Pulmonary:     Effort: Pulmonary effort is normal.  Abdominal:     General: There is no distension.     Palpations: Abdomen is soft. There is no mass.     Tenderness: There is no abdominal tenderness. There is no right CVA tenderness, left CVA tenderness, guarding or rebound.     Hernia: No hernia is present.  Genitourinary:    Comments: Declined examination today.  Neurological:     Mental Status: She is alert.  Psychiatric:        Mood and Affect: Mood normal.    .. Results for orders placed or performed in visit on 12/07/23  POCT URINALYSIS DIP (CLINITEK)   Collection Time: 12/07/23  1:15 PM  Result Value Ref Range   Color, UA  yellow yellow   Clarity, UA clear clear   Glucose, UA negative negative mg/dL   Bilirubin, UA negative negative   Ketones, POC UA negative negative mg/dL   Spec Grav, UA 8.979 8.989 - 1.025   Blood, UA negative negative   pH, UA 7.0 5.0 - 8.0   POC PROTEIN,UA negative negative, trace   Urobilinogen, UA 0.2 0.2 or 1.0 E.U./dL   Nitrite, UA Negative Negative   Leukocytes, UA Negative Negative        Assessment & Plan:  SABRASABRADiagnoses and all orders for this visit:  Hematochezia -     hydrocortisone  (ANUSOL -HC) 25 MG suppository; Place 1 suppository (25 mg total) rectally 2 (two) times daily as needed for hemorrhoids and rectal bleeding.  Internal hemorrhoids -     hydrocortisone  (ANUSOL -HC) 25 MG suppository; Place 1 suppository (25 mg total) rectally 2 (two) times daily as needed for hemorrhoids and rectal bleeding.  History of UTI -     POCT URINALYSIS DIP (CLINITEK) -     Cancel: Urine Culture   UTD colonoscopy Hx of internal hemorrhoids and likely the source of hematochezia  Start Anusol  suppositories for the next week if no improvement need to follow up with GI UA looks great and appears like infection has cleared.   Yoltzin Barg, PA-C

## 2023-12-07 NOTE — Patient Instructions (Signed)
 Rectal suppositories for 1 week if bleeding not improving, follow up with GI for colonoscopy.

## 2023-12-10 ENCOUNTER — Encounter: Payer: Self-pay | Admitting: Physician Assistant

## 2023-12-12 ENCOUNTER — Ambulatory Visit: Admitting: Professional

## 2023-12-12 ENCOUNTER — Encounter: Payer: Self-pay | Admitting: Professional

## 2023-12-12 DIAGNOSIS — F419 Anxiety disorder, unspecified: Secondary | ICD-10-CM

## 2023-12-12 DIAGNOSIS — F5101 Primary insomnia: Secondary | ICD-10-CM | POA: Diagnosis not present

## 2023-12-12 DIAGNOSIS — F3181 Bipolar II disorder: Secondary | ICD-10-CM | POA: Diagnosis not present

## 2023-12-12 NOTE — Progress Notes (Signed)
 [facility] Counselor/Therapist Progress Note  Patient ID: Kathleen Phillips, MRN: [id number],    Date: 12/12/2023  Time Spent: 40 minutes 302-442pm   Treatment Type: Individual Therapy  Risk Assessment: Danger to Self:  No Self-injurious Behavior: No Danger to Others: No  Subjective: This session was held via video teletherapy. The patient consented to video teletherapy and was located in her car for this session. She is aware it is the responsibility of the patient to secure confidentiality on her end of the session. The provider was in a private home office for the duration of this session.    The patient arrived on time for her Caregility appointment.  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 b-has been told she will be assigned to [employer] -pt worked with her 3-4 days and Whitney asked her if she would work with her -she is concerned that it will caused unrest in the office and she will be a target -Annabella is out on surgery and the change would occur when she is out -there is a click that is divided by cultural thing and how it's definitely going to be a problem interpersonally -pt shared with coworker Panya and supervisor Angelica -Angelica appeared to know that there is going to be fall back c-stay focused on what her work is and not to become distracted by the office politics d-she is please with her choice to return to [facility] -the pt's supervisor at the office she came from was demeaning toward the patient -things are up in the air with the last two weeks has been a ripple effect (a rock in a very calm lack) 2-personal a-pt has been struggling with the school load, work load, and husband's needs -school last week she did not get the reading done and therefore was not prepared for the quiz b-quantity of reading was 1/3 more than normal c-the other factor was that she was not on  her meds and that was impacting her d-she shocked her system by returning to the previous dose, she was moody and struggled with focus e-spouse is going to have gallbladder surgery at [facility] due to his cirrhosis f-daughters -her bio daughter gave her the closest of an invitation she will every get you know where we live just call before you come -pt is dealing with sinus issues, and granddaughter and her father have had Covid -her stepdaughter is having seizure sand can go for several days with no seizures and then have multiple in one day  Treatment Plan Problems: Balancing Work and [employer], Caregiving of Aging Parents, Menopause and Perimenopause Symptoms: Experiences stress associated with balancing the multiple tasks associated with motherhood and work roles. Experiences role conflict between two or more roles (i.e., obligations or responsibilities for one role interfere with ability to perform duties for other roles). Describes stress and anxiety associated with cumulative demands of multiple roles. Experiences fatigue and difficulty concentrating (e.g., feeling overwhelmed) due to role overload. Experiences partner relationship strain and dissatisfaction due to inequitable division of responsibilities. Exhibits anxiety and grief related to change in parent-child dynamics due to assuming caregiving responsibilities for parents. Experiences stress and frustration associated with caregiving responsibilities. Experiences psychological distress associated with unfinished business as it relates to aging parent(s) (e.g., parent-child conflict, sibling relationships, familial role expectations). Demonstrates difficulty managing caregiving with existing roles and responsibilities (i.e., home, family, and work). Experiences physiological indicators of menopause (e.g., hot flashes, heavy and/or irregular  periods, vaginal dryness, night sweats, heart palpitations, migraine headaches,  weight gain, fibroids, change in libido, urinary symptoms, breast swelling/tenderness, insomnia, skin changes). Internalizes and verbalizes negative attitudes and a lowered self-esteem related to menopause and aging. Evidences a loss of interest in sex or romantic relationships. Goals: Maintain a balance between the multiple demands of motherhood, work, and other life roles and responsibilities. Develop a social support network for self and aging parent(s). Develop a balance between caregiving responsibilities and other roles, responsibilities, and interests. Resolve associated grief and anxiety and adapt to new caregiving role and life circumstances. Resolve the psychological effects of menopause, including depression, anxiety, and body image concerns. Adapt to the normal physical changes that occur with the onset and course of menopause. Objectives target date for all objectives is 11/13/2024: Describe role and responsibilities associated with work and family and related thoughts, feelings, and behaviors. Implement assertiveness skills and limit setting. Learn and implement stress management and relaxation techniques to reduce fatigue, anxiety, and depressive symptoms. Communicate needs with partner regarding multiple role obligations. Protect each role from interfering with the other. Identify and participate in theme support groups. Commit to a healthy eating, sleeping, and exercise routine. Describe family history and details of current caretaking situation. Develop effective communication skills and boundaries that will facilitate a harmonious relationship and caregiving of aging parent. Increase social support contacts to reduce isolation and stress related to new role expectations. Implement stress management practices aimed at a restoration of physical and emotional balance. Identify coping strategies to effectively process inevitable loss of parent(s). Differentiate between which  midlife symptoms are due to perimenopause/ menopause and which may simply be concurrent developmental changes. Implement at least two strategies to relieve insomnia. Implement at least two strategies to help reduce hot flashes. Intervention: Refer the client to read books such as The Wisdom of Menopause  by Lonzo, The Sexy Years by Donelda, and a book of essays called The Ageless Spirit: Reflections on Living Life to the Fullest in Midlife and the Years Beyond by Wildwood. Discuss with the client her process of self-assessment and reexamination and the positive implications for the future (e.g., achieved more experience, competence, and greater freedom). Explore options (e.g., supplements/herbs, oral contraceptives, HRT) for reducing hot flashes, which can adversely affect sleep and performing daily tasks. Educate the client on the relationship between insomnia/loss of sleep and its impact on overall cognitive functioning (e.g., concentration, memory, clarity of thought) and emotional functioning (e.g., irritability, depressive symptoms). Encourage the client to implement new sleep induction habits (e.g., exercise early in the day, take a warm bath before retiring, drink chamomile tea, read, engage in meditation/yoga). Encourage the client to eat a healthy diet that is low in fat and cholesterol; moderate in total fat; high in fiber; high in fruits, vegetables, and whole-grain foods; and well-balanced in vitamins and minerals, including calcium. Assist the client in planning an exercise/yoga/medication routine that will be beneficial in reducing depressive symptoms, anxiety, and stress, as well as the risks of osteoporosis, heart disease, and weight gain/body image issues associated with menopause. Explore with the client her multiple roles and responsibilities associated with work and family; clarify related thoughts and feelings. Explore with the client her expectations regarding the balance between  work and family, and discuss how this relates to internalized gender role stereotypes. Assist the client in identifying the causes and consequences of at least two internal and two external expectations associated with balancing work and family roles. Recommend that the client read and implement  programs from Exercising Your Way to Better Mental Health by Noe. Explore potential self-care activities and encourage the client's participation (or assign Identify and Schedule Pleasant Activities in the Adult Psychotherapy Homework Planner, 2nd ed. by Jenniffer). Use behavioral techniques (i.e., education, modeling, role-playing, corrective feedback, positive reinforcement) to teach communication skills, including assertive communication, offering positive feedback, active listening, making positive requests of others for behavior change, and giving negative feedback in an honest and respectful manner. Explore, along with the client, strategies for making her life more manageable and less stressful (e.g., waking up before the children to exercise or have a cup of tea, pack the children's lunches and backpacks the night before). Encourage the client to develop a schedule of roles and responsibilities with her partner; assign her to meet regularly with her partner to discuss, review, and revise the schedule as needed. Document the client's family history, including age, health, and relationship dynamics of her parent(s), sibling(s), child(ren), and partner(s). Explore the details of the client's caregiving duties for her parents. Assist the client with generating a realistic and effective time management schedule. Assist the client in developing an awareness of automatic thoughts that reflect unrealistic expectations regarding caring for aging parents. Do behavioral experiments in which unrealistic automatic thoughts are treated as hypotheses/predictions, reality-based alternative hypotheses/predictions are  generated, and both are tested against the client's past, present, and/or future experiences. Reinforce the client's positive, reality-based cognitive messages that enhance self-confidence and increase adaptive action (see Positive Self-Talk in the Adult Psychotherapy Homework Planner, 2nd ed. by Jenniffer).  Diagnosis:Mixed bipolar II disorder (HCC)  Anxiety  Primary insomnia  Plan:  -balance between all of her responsibilities -meet again on Wednesday, December 26, 2023 at [employer] [facility].

## 2023-12-24 ENCOUNTER — Other Ambulatory Visit (HOSPITAL_BASED_OUTPATIENT_CLINIC_OR_DEPARTMENT_OTHER): Payer: Self-pay

## 2023-12-26 ENCOUNTER — Encounter: Payer: Self-pay | Admitting: Professional

## 2023-12-26 ENCOUNTER — Ambulatory Visit (INDEPENDENT_AMBULATORY_CARE_PROVIDER_SITE_OTHER): Admitting: Professional

## 2023-12-26 DIAGNOSIS — F3181 Bipolar II disorder: Secondary | ICD-10-CM | POA: Diagnosis not present

## 2023-12-26 DIAGNOSIS — F419 Anxiety disorder, unspecified: Secondary | ICD-10-CM

## 2023-12-26 NOTE — Progress Notes (Signed)
 [facility] Counselor/Therapist Progress Note  Patient ID: Kathleen Phillips, MRN: [id number],    Date: 12/26/2023  Time Spent: 43 minutes [id number]   Treatment Type: Individual Therapy  Risk Assessment: Danger to Self:  No Self-injurious Behavior: No Danger to Others: No  Subjective: This session was held via video teletherapy. The patient consented to video teletherapy and was located in her car for this session. She is aware it is the responsibility of the patient to secure confidentiality on her end of the session. The provider was in a private home office for the duration of this session.    The patient arrived on time for her Caregility appointment.  Issues Addressed: 1-personal -pt tried to work with [name] on balancing out her workload -she is ready to make a chance -pt reconnected with best friend Redell, he has always had my back since my daughter had my back -it's something that he told me in our conversation is that his daughter still talks to him -she introduces him to her husband as the guy she feels should have been her dad -she was in her hometown and went to see the baby this weekend and saw her for the first time since she was born -went and saw time with her mom at her grave -saw her brother and they don't talk much -she sat in a parking lot in her home town and called up and he was available -they have been messaging and he has said some things that have hurt my feelings, that are true, and that I needed to hear -he has always been honest -she doesn't know what she will do at this moment; doesn't know if moving back to the county she is from but not the town -she would not leave her brother until he gave her a hug -spirituality-how do you and God   Treatment Plan Problems: Balancing Work and [employer], Caregiving of Aging Parents, Menopause and Perimenopause Symptoms: Experiences stress associated with balancing the multiple tasks  associated with motherhood and work roles. Experiences role conflict between two or more roles (i.e., obligations or responsibilities for one role interfere with ability to perform duties for other roles). Describes stress and anxiety associated with cumulative demands of multiple roles. Experiences fatigue and difficulty concentrating (e.g., feeling overwhelmed) due to role overload. Experiences partner relationship strain and dissatisfaction due to inequitable division of responsibilities. Exhibits anxiety and grief related to change in parent-child dynamics due to assuming caregiving responsibilities for parents. Experiences stress and frustration associated with caregiving responsibilities. Experiences psychological distress associated with unfinished business as it relates to aging parent(s) (e.g., parent-child conflict, sibling relationships, familial role expectations). Demonstrates difficulty managing caregiving with existing roles and responsibilities (i.e., home, family, and work). Experiences physiological indicators of menopause (e.g., hot flashes, heavy and/or irregular periods, vaginal dryness, night sweats, heart palpitations, migraine headaches, weight gain, fibroids, change in libido, urinary symptoms, breast swelling/tenderness, insomnia, skin changes). Internalizes and verbalizes negative attitudes and a lowered self-esteem related to menopause and aging. Evidences a loss of interest in sex or romantic relationships. Goals: Maintain a balance between the multiple demands of motherhood, work, and other life roles and responsibilities. Develop a social support network for self and aging parent(s). Develop a balance between caregiving responsibilities and other roles, responsibilities, and interests. Resolve associated grief and anxiety and adapt to new caregiving role and life circumstances. Resolve the psychological effects of menopause, including depression, anxiety, and body  image concerns. Adapt to the normal physical changes that  occur with the onset and course of menopause. Objectives target date for all objectives is 11/13/2024: Describe role and responsibilities associated with work and family and related thoughts, feelings, and behaviors. Implement assertiveness skills and limit setting. Learn and implement stress management and relaxation techniques to reduce fatigue, anxiety, and depressive symptoms. Communicate needs with partner regarding multiple role obligations. Protect each role from interfering with the other. Identify and participate in theme support groups. Commit to a healthy eating, sleeping, and exercise routine. Describe family history and details of current caretaking situation. Develop effective communication skills and boundaries that will facilitate a harmonious relationship and caregiving of aging parent. Increase social support contacts to reduce isolation and stress related to new role expectations. Implement stress management practices aimed at a restoration of physical and emotional balance. Identify coping strategies to effectively process inevitable loss of parent(s). Differentiate between which midlife symptoms are due to perimenopause/ menopause and which may simply be concurrent developmental changes. Implement at least two strategies to relieve insomnia. Implement at least two strategies to help reduce hot flashes. Intervention: Refer the client to read books such as The Wisdom of Menopause  by Lonzo, The Sexy Years by Donelda, and a book of essays called The Ageless Spirit: Reflections on Living Life to the Fullest in Midlife and the Years Beyond by Pound. Discuss with the client her process of self-assessment and reexamination and the positive implications for the future (e.g., achieved more experience, competence, and greater freedom). Explore options (e.g., supplements/herbs, oral contraceptives, HRT) for reducing hot  flashes, which can adversely affect sleep and performing daily tasks. Educate the client on the relationship between insomnia/loss of sleep and its impact on overall cognitive functioning (e.g., concentration, memory, clarity of thought) and emotional functioning (e.g., irritability, depressive symptoms). Encourage the client to implement new sleep induction habits (e.g., exercise early in the day, take a warm bath before retiring, drink chamomile tea, read, engage in meditation/yoga). Encourage the client to eat a healthy diet that is low in fat and cholesterol; moderate in total fat; high in fiber; high in fruits, vegetables, and whole-grain foods; and well-balanced in vitamins and minerals, including calcium. Assist the client in planning an exercise/yoga/medication routine that will be beneficial in reducing depressive symptoms, anxiety, and stress, as well as the risks of osteoporosis, heart disease, and weight gain/body image issues associated with menopause. Explore with the client her multiple roles and responsibilities associated with work and family; clarify related thoughts and feelings. Explore with the client her expectations regarding the balance between work and family, and discuss how this relates to internalized gender role stereotypes. Assist the client in identifying the causes and consequences of at least two internal and two external expectations associated with balancing work and family roles. Recommend that the client read and implement programs from Exercising Your Way to Better Mental Health by Noe. Explore potential self-care activities and encourage the client's participation (or assign Identify and Schedule Pleasant Activities in the Adult Psychotherapy Homework Planner, 2nd ed. by Jenniffer). Use behavioral techniques (i.e., education, modeling, role-playing, corrective feedback, positive reinforcement) to teach communication skills, including assertive communication,  offering positive feedback, active listening, making positive requests of others for behavior change, and giving negative feedback in an honest and respectful manner. Explore, along with the client, strategies for making her life more manageable and less stressful (e.g., waking up before the children to exercise or have a cup of tea, pack the children's lunches and backpacks the night before). Encourage the client to  develop a schedule of roles and responsibilities with her partner; assign her to meet regularly with her partner to discuss, review, and revise the schedule as needed. Document the client's family history, including age, health, and relationship dynamics of her parent(s), sibling(s), child(ren), and partner(s). Explore the details of the client's caregiving duties for her parents. Assist the client with generating a realistic and effective time management schedule. Assist the client in developing an awareness of automatic thoughts that reflect unrealistic expectations regarding caring for aging parents. Do behavioral experiments in which unrealistic automatic thoughts are treated as hypotheses/predictions, reality-based alternative hypotheses/predictions are generated, and both are tested against the client's past, present, and/or future experiences. Reinforce the client's positive, reality-based cognitive messages that enhance self-confidence and increase adaptive action (see Positive Self-Talk in the Adult Psychotherapy Homework Planner, 2nd ed. by Jenniffer).  Diagnosis:Mixed bipolar II disorder [facility])  Anxiety  Plan:  -consider her plan for exiting her marriage -meet again on Wednesday, January 09, 2024 at [employer].

## 2024-01-07 ENCOUNTER — Other Ambulatory Visit (HOSPITAL_BASED_OUTPATIENT_CLINIC_OR_DEPARTMENT_OTHER): Payer: Self-pay

## 2024-01-07 ENCOUNTER — Telehealth (HOSPITAL_BASED_OUTPATIENT_CLINIC_OR_DEPARTMENT_OTHER): Admitting: Family

## 2024-01-07 DIAGNOSIS — F5101 Primary insomnia: Secondary | ICD-10-CM | POA: Diagnosis not present

## 2024-01-07 DIAGNOSIS — F3181 Bipolar II disorder: Secondary | ICD-10-CM | POA: Diagnosis not present

## 2024-01-07 DIAGNOSIS — F419 Anxiety disorder, unspecified: Secondary | ICD-10-CM

## 2024-01-07 MED ORDER — VORTIOXETINE HBR 20 MG PO TABS
20.0000 mg | ORAL_TABLET | Freq: Every day | ORAL | 0 refills | Status: AC
  Filled 2024-01-07 – 2024-01-23 (×2): qty 90, 90d supply, fill #0

## 2024-01-07 NOTE — Progress Notes (Signed)
 Virtual Visit via Video Note  I connected with Kathleen Phillips on 01/07/24 at  1:30 PM EDT by a video enabled telemedicine application and verified that I am speaking with the correct person using two identifiers.  Location: Patient: Home Provider: office   I discussed the limitations of evaluation and management by telemedicine and the availability of in person appointments. The patient expressed understanding and agreed to proceed.    I discussed the assessment and treatment plan with the patient. The patient was provided an opportunity to ask questions and all were answered. The patient agreed with the plan and demonstrated an understanding of the instructions.   The patient was advised to call back or seek an in-person evaluation if the symptoms worsen or if the condition fails to improve as anticipated.  I provided 10 minutes of non-face-to-face time during this encounter.   Staci LOISE Kerns, NP   [facility] MD/PA/NP OP Progress Note  01/07/2024 1:49 PM Danel Studzinski  MRN:  [id number]  Chief Complaint: Medication management   HPI: Kathleen Phillips 48 year old female presents for medication management follow-up appointment.  Seen and evaluated via care agility virtual platform.  She carries a diagnosis related to bipolar 2 disorder, mixed bipolar, major depressive disorder and generalized anxiety disorder.  She is currently prescribed Vraylar  3 mg which is managed by her primary care provider.  Patient was initiated on Trintellix  20 mg daily by previous psychiatrist which she reports she has been taking and tolerating well.  No concerns related to akathisia, restlessness or medication side effects.  This provider inquired about sleep disturbance.  She states past week and a half has been rough as hers has been recently had surgery and she has been caring for him throughout the night.  States overall feels that everything is starting to settle down and her sleep has improved.  Declined any  medications for sleep disturbance at this visit.  Discussed making medication available x 90 tablets.  She was amendable to plan.  No additional concerns noted.  [name]'s mood is congruent and she not appear to be responding to internal or external stimuli.  Answered all questions appropriately.  Noted to be resting in her car during this assessment.  Support, encouragement and reassurance was provided.   Visit Diagnosis:    ICD-10-CM   1. Mixed bipolar II disorder (HCC)  F31.81 vortioxetine  HBr (TRINTELLIX ) 20 MG TABS tablet    2. Primary insomnia  F51.01 vortioxetine  HBr (TRINTELLIX ) 20 MG TABS tablet    3. Anxiety  F41.9 vortioxetine  HBr (TRINTELLIX ) 20 MG TABS tablet      Past Psychiatric History:   Past Medical History:  Past Medical History:  Diagnosis Date   Anemia    Anxiety    Constipation    Depression    Hypothyroidism    Migraines    Neurocardiogenic syncope    Palpitations    Sleep apnea    Swelling of lower extremity    Thyroid  disease    Vaginal Pap smear, abnormal     Past Surgical History:  Procedure Laterality Date   CERVICAL CONE BIOPSY     ENDOMETRIAL ABLATION     KNEE SURGERY     MENISCUS REPAIR      Family Psychiatric History:   Family History:  Family History  Problem Relation Age of Onset   Hypertension Mother    Heart attack Mother    Diabetes Mother    Skin cancer Mother    Heart disease Mother  Sudden death Mother    Kidney disease Mother    Lung cancer Father    Kidney disease Father    Heart attack Maternal Grandmother     Social History:  Social History   Socioeconomic History   Marital status: Married    Spouse name: Not on file   Number of children: Not on file   Years of education: Not on file   Highest education level: Some college, no degree  Occupational History   Not on file  Tobacco Use   Smoking status: Never   Smokeless tobacco: Never  Substance and Sexual Activity   Alcohol use: Never   Drug use:  Never   Sexual activity: Yes    Birth control/protection: Surgical    Comment: vasectomy  Other Topics Concern   Not on file  Social History Narrative   Not on file   Social Drivers of Health   Financial Resource Strain: Low Risk  (05/24/2023)   Overall Financial Resource Strain (CARDIA)    Difficulty of Paying Living Expenses: Not hard at all  Food Insecurity: No Food Insecurity (05/24/2023)   Hunger Vital Sign    Worried About Running Out of Food in the Last Year: Never true    Ran Out of Food in the Last Year: Never true  Transportation Needs: No Transportation Needs (05/24/2023)   PRAPARE - Administrator, Civil Service (Medical): No    Lack of Transportation (Non-Medical): No  Physical Activity: Sufficiently Active (05/24/2023)   Exercise Vital Sign    Days of Exercise per Week: 5 days    Minutes of Exercise per Session: 30 min  Stress: Stress Concern Present (05/24/2023)   [employer] of Occupational Health - Occupational Stress Questionnaire    Feeling of Stress : To some extent  Social Connections: Moderately Integrated (05/24/2023)   Social Connection and Isolation Panel    Frequency of Communication with Friends and Family: Never    Frequency of Social Gatherings with Friends and Family: Never    Attends Religious Services: More than 4 times per year    Active Member of [employer] or Organizations: Yes    Attends Banker Meetings: 1 to 4 times per year    Marital Status: Married  Recent Concern: Social Connections - Socially Isolated (03/24/2023)   Received from [employer]   Social Network    How would you rate your social network (family, work, friends)?: Little participation, lonely and socially isolated    Allergies:  Allergies  Allergen Reactions   Bupropion Itching and Other (See Comments)    Other reaction(s): Other Hands itch Hands itch    Avocado Nausea And Vomiting   Ibuprofen Other (See Comments)    Palpation    Other  Other (See Comments)    Berries   Tape     Red/sore skin    Metabolic Disorder Labs: Lab Results  Component Value Date   HGBA1C 5.2 08/14/2022   No results found for: PROLACTIN Lab Results  Component Value Date   CHOL 162 11/13/2022   TRIG 124 11/13/2022   HDL 51 11/13/2022   CHOLHDL 3.2 11/13/2022   LDLCALC 89 11/13/2022   LDLCALC 77 08/14/2022   Lab Results  Component Value Date   TSH 3.930 10/02/2022   TSH 6.170 (H) 08/14/2022    Therapeutic Level Labs: No results found for: LITHIUM No results found for: VALPROATE No results found for: CBMZ  Current Medications: Current Outpatient Medications  Medication Sig  Dispense Refill   AMBULATORY NON FORMULARY MEDICATION Tirzepitide compounded     cariprazine  (VRAYLAR ) 3 MG capsule Take 1 capsule (3 mg total) by mouth daily. 90 capsule 3   Cholecalciferol (VITAMIN D ) 50 MCG (2000 UT) tablet Take 2,000 Units by mouth daily.     EPINEPHrine  0.3 mg/0.3 mL IJ SOAJ injection Inject 0.3 mg into the muscle as needed for anaphylaxis. 2 each 11   hydrocortisone  (ANUSOL -HC) 25 MG suppository Place 1 suppository (25 mg total) rectally 2 (two) times daily as needed for hemorrhoids and rectal bleeding. 24 suppository 11   levocetirizine (XYZAL ) 5 MG tablet Take 1 tablet (5 mg total) by mouth every evening. 90 tablet 3   levothyroxine  (SYNTHROID ) 75 MCG tablet Take 1 tablet (75 mcg total) by mouth daily. 90 tablet 1   meloxicam  (MOBIC ) 15 MG tablet Take 1 tablet (15 mg total) by mouth daily. 90 tablet 3   methocarbamol  (ROBAXIN ) 500 MG tablet Take 1 tablet (500 mg total) by mouth 3 (three) times daily. 90 tablet 0   Multiple Vitamins-Minerals (MULTIVITAMIN WITH MINERALS) tablet Take 1 tablet by mouth daily.     nitrofurantoin , macrocrystal-monohydrate, (MACROBID ) 100 MG capsule Take 1 capsule (100 mg total) by mouth 2 (two) times daily. 10 capsule 0   ondansetron  (ZOFRAN -ODT) 4 MG disintegrating tablet Take 1 tablet (4 mg total) by  mouth 2 (two) times daily as needed for nausea. 10 tablet 0   ondansetron  (ZOFRAN -ODT) 8 MG disintegrating tablet Take 1 tablet (8 mg total) by mouth every 8 (eight) hours as needed for nausea. 20 tablet 3   rizatriptan  (MAXALT ) 10 MG tablet Take 1 tablet (10 mg total) by mouth as needed for migraine. May repeat in 2 hours if needed. 10 tablet 5   topiramate  (TOPAMAX ) 100 MG tablet Take 1 tablet (100 mg total) by mouth daily. 90 tablet 3   traMADol  (ULTRAM ) 50 MG tablet Take 1-2 tablets (50-100 mg total) by mouth every 12 (twelve) hours as needed for moderate pain (pain score 4-6). Maximum 6 tabs per day. 60 tablet 0   valACYclovir  (VALTREX ) 500 MG tablet Take one tablet every 12 hours for 3 days for outbreaks. 36 tablet 1   vortioxetine  HBr (TRINTELLIX ) 20 MG TABS tablet Take 1 tablet (20 mg total) by mouth daily. 90 tablet 0   No current facility-administered medications for this visit.     Musculoskeletal: Virtual platform Psychiatric Specialty Exam: Review of Systems  There were no vitals taken for this visit.There is no height or weight on file to calculate BMI.  General Appearance: Casual  Eye Contact:  Good  Speech:  Clear and Coherent  Volume:  Normal  Mood:  Euphoric  Affect:  Congruent  Thought Process:  Coherent  Orientation:  Full (Time, Place, and Person)  Thought Content: Logical   Suicidal Thoughts:  No  Homicidal Thoughts:  No  Memory:  Immediate;   Good Recent;   Good  Judgement:  Good  Insight:  Good  Psychomotor Activity:  Normal  Concentration:  Concentration: Good  Recall:  Good  Fund of Knowledge: Good  Language: Good  Akathisia:  No  Handed:  Right  AIMS (if indicated): not done  Assets:  Communication Skills Desire for Improvement  ADL's:  Intact  Cognition: WNL  Sleep:  Fair improving   Screenings: GAD-7    Flowsheet Row Office Visit from 11/21/2023 in [facility] at [employer] Counselor from 10/24/2023  in [facility] at [employer] Office Visit from 02/09/2023 in [facility] Office Visit from 11/13/2022 in [facility] at [facility] Office Visit from 05/31/2022 in [facility] Primary Care & [facility] at [facility]  Total GAD-7 Score 0 4 6 14 9    PHQ2-9    Flowsheet Row Office Visit from 11/21/2023 in [facility] Primary Care & Sports Medicine at [facility] Counselor from 10/24/2023 in [facility] at [facility] Office Visit from [facility]/09/[facility]23 [facility] Office Visit from 11/13/2022 in [facility] Primary Care & Sports Medicine at [facility] Office Visit from 05/31/2022 in [facility] Primary Care & Sports Medicine at [facility] Total Score 0 2 4 3 3   PHQ-9 Total Score 4 10 15 14 14    Flowsheet Row ED from 04/04/2023 in [facility] Emergency Department at [facility] Office Visit from 02/09/2023 in [facility] ED from 08/18/2022 in [facility] Emergency Department at [facility]  C-SSRS RISK CATEGORY No Risk No Risk No Risk     Assessment and Plan: Kathleen Phillips 48 year old female presents for medication management follow-up appointment.  She is currently prescribed Trintellix  for mood stabilization.  She reports she has been taking and tolerating medications well.  Overall she reports her mood is stabilized.  Of note patient also was prescribed Vraylar  3 mg which is managed by her primary care provider.  No additional concerns noted at this visit.  Did report some concerns related to sleep disturbance however, attribute poor sleep due to has been having surgical procedure.  No additional concerns noted at this visit  Collaboration of Care: Collaboration of Care: Medication Management AEB  Trintellix  20 mg made available x 90 tablets follow-up 3 months for medication adherence/tolerability.  Patient/Guardian was advised Release of Information must be obtained prior to any record release in order to collaborate their care with an outside provider. Patient/Guardian was advised if they have not already done so to contact the registration department to sign all necessary forms in order for us  to release information regarding their care.   Consent: Patient/Guardian gives verbal consent for treatment and assignment of benefits for services provided during this visit. Patient/Guardian expressed understanding and agreed to proceed.    Staci LOISE Kerns, NP 01/07/2024, 1:49 PM

## 2024-01-09 ENCOUNTER — Ambulatory Visit: Admitting: Professional

## 2024-01-16 ENCOUNTER — Other Ambulatory Visit (HOSPITAL_BASED_OUTPATIENT_CLINIC_OR_DEPARTMENT_OTHER): Payer: Self-pay

## 2024-01-16 ENCOUNTER — Ambulatory Visit (INDEPENDENT_AMBULATORY_CARE_PROVIDER_SITE_OTHER): Admitting: Urgent Care

## 2024-01-16 ENCOUNTER — Encounter: Payer: Self-pay | Admitting: Urgent Care

## 2024-01-16 VITALS — BP 104/70 | HR 75 | Wt 230.0 lb

## 2024-01-16 DIAGNOSIS — L089 Local infection of the skin and subcutaneous tissue, unspecified: Secondary | ICD-10-CM | POA: Diagnosis not present

## 2024-01-16 DIAGNOSIS — L309 Dermatitis, unspecified: Secondary | ICD-10-CM | POA: Diagnosis not present

## 2024-01-16 MED ORDER — MUPIROCIN 2 % EX OINT
TOPICAL_OINTMENT | CUTANEOUS | 3 refills | Status: AC
  Filled 2024-01-16: qty 44, 15d supply, fill #0

## 2024-01-16 MED ORDER — TRIAMCINOLONE ACETONIDE 0.5 % EX CREA
1.0000 | TOPICAL_CREAM | Freq: Two times a day (BID) | CUTANEOUS | 3 refills | Status: AC
  Filled 2024-01-16: qty 30, 15d supply, fill #0

## 2024-01-16 NOTE — Patient Instructions (Addendum)
 Start triamcinolone  twice daily to affected area. Do not occlude. Do not exceed 14 days of consecutive use.  Use the muirocin up to three times daily to the pustules on the back.  If rash persists >14 days, please follow up for recheck.
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
## 2024-01-23 ENCOUNTER — Encounter: Payer: Self-pay | Admitting: Professional

## 2024-01-23 ENCOUNTER — Ambulatory Visit: Admitting: Professional

## 2024-01-23 ENCOUNTER — Other Ambulatory Visit (HOSPITAL_BASED_OUTPATIENT_CLINIC_OR_DEPARTMENT_OTHER): Payer: Self-pay

## 2024-01-23 ENCOUNTER — Other Ambulatory Visit: Payer: Self-pay

## 2024-01-23 DIAGNOSIS — F3181 Bipolar II disorder: Secondary | ICD-10-CM

## 2024-01-23 DIAGNOSIS — F411 Generalized anxiety disorder: Secondary | ICD-10-CM

## 2024-01-23 NOTE — Progress Notes (Signed)
 [facility] Counselor/Therapist Progress Note  Patient ID: Kathleen Phillips, MRN: [id number],    Date: 01/23/2024  Time Spent: 51 minutes 3-351pm   Treatment Type: Individual Therapy  Risk Assessment: Danger to Self:  No Self-injurious Behavior: No Danger to Others: No  Subjective: This session was held via video teletherapy. The patient consented to video teletherapy and was located in her car for this session. She is aware it is the responsibility of the patient to secure confidentiality on her end of the session. The provider was in a private home office for the duration of this session.    The patient arrived on time for her Caregility appointment.  Issues Addressed: 1-pt has considered her plan for exiting her marriage -she talked to her friend Redell and she doesn't think he has room for me -he thinks he does but he has a 3-bedroom home and he's a pack rat, on the verge of hoarding -pt doesn't know if the environment is conducive to another person living there -the home triggers childhood memories of her stepfather's hoarding -is the condition of his home a deal breaker for her and she does not know -she has seen it more sparse but it appears that living alone he has gotten lazy, doesn't notice the excessive hoarding  -he has also gained a lot of weight nad let himself go -he has recently started working a new job 2-marital -I am so done -got rash on shoulder blade and was prescribed creams and he will not help her put creams on and wash his hands  -I am not going to let him smear the medicine all over the house -pt was initially very pissed off -I can get over the act that I've done os much, and sacrificed so much, and that he will not wash his hands to help me is what it comes down to -giving herself six months to get her own place -based on my attitude I think he knows I'm done -pt has not told him she is done with the marriage -they both see a  Librarian, academic, her at 1 and [name] at 2 and other times as well -pt is spending more time away from the home, spending more time doing things for herself, taking care of her needs first when he yells for her -she has not talked with her christian counselor about leaving her marriage -pt reports the past few days he has been sick and not following the post-surgery gall bladder diet -pt's behaviors toward her spouse have changed  -she is investing more in her school work than in her spouse -pt sees nothing that will stop her from leaving after 13 years of being put down and of not being a priority-she is tried that everyone else is always a priority over me -pt admits that Redell has a big wide lazy streak but he has never put her down -Redell stayed in touch with her daughter even though he did not need to an their relationship is good -her daughter will not talk to pt's husband -pt admits that her mental health is what caused the relationship issues with Redell that caused the break up -when pt's mother died her depression spiraled and the relationship with her daughter became more difficult because she was the parent, pt was 71 and daughter was 52 3-planning for future -is already considering renting a storage unit -is in the contemplation phase of change  Treatment Plan Problems: Balancing Work and [employer], Caregiving of Aging  Parents, Menopause and Perimenopause Symptoms: Experiences stress associated with balancing the multiple tasks associated with motherhood and work roles. Experiences role conflict between two or more roles (i.e., obligations or responsibilities for one role interfere with ability to perform duties for other roles). Describes stress and anxiety associated with cumulative demands of multiple roles. Experiences fatigue and difficulty concentrating (e.g., feeling overwhelmed) due to role overload. Experiences partner relationship strain and  dissatisfaction due to inequitable division of responsibilities. Exhibits anxiety and grief related to change in parent-child dynamics due to assuming caregiving responsibilities for parents. Experiences stress and frustration associated with caregiving responsibilities. Experiences psychological distress associated with unfinished business as it relates to aging parent(s) (e.g., parent-child conflict, sibling relationships, familial role expectations). Demonstrates difficulty managing caregiving with existing roles and responsibilities (i.e., home, family, and work). Experiences physiological indicators of menopause (e.g., hot flashes, heavy and/or irregular periods, vaginal dryness, night sweats, heart palpitations, migraine headaches, weight gain, fibroids, change in libido, urinary symptoms, breast swelling/tenderness, insomnia, skin changes). Internalizes and verbalizes negative attitudes and a lowered self-esteem related to menopause and aging. Evidences a loss of interest in sex or romantic relationships. Goals: Maintain a balance between the multiple demands of motherhood, work, and other life roles and responsibilities. Develop a social support network for self and aging parent(s). Develop a balance between caregiving responsibilities and other roles, responsibilities, and interests. Resolve associated grief and anxiety and adapt to new caregiving role and life circumstances. Resolve the psychological effects of menopause, including depression, anxiety, and body image concerns. Adapt to the normal physical changes that occur with the onset and course of menopause. Objectives target date for all objectives is 11/13/2024: Describe role and responsibilities associated with work and family and related thoughts, feelings, and behaviors. Implement assertiveness skills and limit setting. Learn and implement stress management and relaxation techniques to reduce fatigue, anxiety, and depressive  symptoms. Communicate needs with partner regarding multiple role obligations. Protect each role from interfering with the other. Identify and participate in theme support groups. Commit to a healthy eating, sleeping, and exercise routine. Describe family history and details of current caretaking situation. Develop effective communication skills and boundaries that will facilitate a harmonious relationship and caregiving of aging parent. Increase social support contacts to reduce isolation and stress related to new role expectations. Implement stress management practices aimed at a restoration of physical and emotional balance. Identify coping strategies to effectively process inevitable loss of parent(s). Differentiate between which midlife symptoms are due to perimenopause/ menopause and which may simply be concurrent developmental changes. Implement at least two strategies to relieve insomnia. Implement at least two strategies to help reduce hot flashes. Intervention: Refer the client to read books such as The Wisdom of Menopause  by Lonzo, The Sexy Years by Donelda, and a book of essays called The Ageless Spirit: Reflections on Living Life to the Fullest in Midlife and the Years Beyond by Prunedale. Discuss with the client her process of self-assessment and reexamination and the positive implications for the future (e.g., achieved more experience, competence, and greater freedom). Explore options (e.g., supplements/herbs, oral contraceptives, HRT) for reducing hot flashes, which can adversely affect sleep and performing daily tasks. Educate the client on the relationship between insomnia/loss of sleep and its impact on overall cognitive functioning (e.g., concentration, memory, clarity of thought) and emotional functioning (e.g., irritability, depressive symptoms). Encourage the client to implement new sleep induction habits (e.g., exercise early in the day, take a warm bath before retiring,  drink chamomile tea, read, engage  in meditation/yoga). Encourage the client to eat a healthy diet that is low in fat and cholesterol; moderate in total fat; high in fiber; high in fruits, vegetables, and whole-grain foods; and well-balanced in vitamins and minerals, including calcium. Assist the client in planning an exercise/yoga/medication routine that will be beneficial in reducing depressive symptoms, anxiety, and stress, as well as the risks of osteoporosis, heart disease, and weight gain/body image issues associated with menopause. Explore with the client her multiple roles and responsibilities associated with work and family; clarify related thoughts and feelings. Explore with the client her expectations regarding the balance between work and family, and discuss how this relates to internalized gender role stereotypes. Assist the client in identifying the causes and consequences of at least two internal and two external expectations associated with balancing work and family roles. Recommend that the client read and implement programs from Exercising Your Way to Better Mental Health by Noe. Explore potential self-care activities and encourage the client's participation (or assign Identify and Schedule Pleasant Activities in the Adult Psychotherapy Homework Planner, 2nd ed. by Jenniffer). Use behavioral techniques (i.e., education, modeling, role-playing, corrective feedback, positive reinforcement) to teach communication skills, including assertive communication, offering positive feedback, active listening, making positive requests of others for behavior change, and giving negative feedback in an honest and respectful manner. Explore, along with the client, strategies for making her life more manageable and less stressful (e.g., waking up before the children to exercise or have a cup of tea, pack the children's lunches and backpacks the night before). Encourage the client to develop a schedule of  roles and responsibilities with her partner; assign her to meet regularly with her partner to discuss, review, and revise the schedule as needed. Document the client's family history, including age, health, and relationship dynamics of her parent(s), sibling(s), child(ren), and partner(s). Explore the details of the client's caregiving duties for her parents. Assist the client with generating a realistic and effective time management schedule. Assist the client in developing an awareness of automatic thoughts that reflect unrealistic expectations regarding caring for aging parents. Do behavioral experiments in which unrealistic automatic thoughts are treated as hypotheses/predictions, reality-based alternative hypotheses/predictions are generated, and both are tested against the client's past, present, and/or future experiences. Reinforce the client's positive, reality-based cognitive messages that enhance self-confidence and increase adaptive action (see Positive Self-Talk in the Adult Psychotherapy Homework Planner, 2nd ed. by Jenniffer).  Diagnosis:Mixed bipolar II disorder (HCC)  Generalized anxiety disorder  Plan:  -take more steps toward action steps -meet again on Wednesday, February 06, 2024 at [employer] [facility].

## 2024-01-24 ENCOUNTER — Other Ambulatory Visit (HOSPITAL_BASED_OUTPATIENT_CLINIC_OR_DEPARTMENT_OTHER): Payer: Self-pay

## 2024-01-29 ENCOUNTER — Emergency Department (HOSPITAL_BASED_OUTPATIENT_CLINIC_OR_DEPARTMENT_OTHER)

## 2024-01-29 ENCOUNTER — Encounter (HOSPITAL_BASED_OUTPATIENT_CLINIC_OR_DEPARTMENT_OTHER): Payer: Self-pay

## 2024-01-29 ENCOUNTER — Emergency Department (HOSPITAL_BASED_OUTPATIENT_CLINIC_OR_DEPARTMENT_OTHER)
Admission: EM | Admit: 2024-01-29 | Discharge: 2024-01-29 | Disposition: A | Discharge status: Home / Self Care | Attending: Emergency Medicine | Admitting: Emergency Medicine

## 2024-01-29 ENCOUNTER — Other Ambulatory Visit: Payer: Self-pay

## 2024-01-29 DIAGNOSIS — R079 Chest pain, unspecified: Secondary | ICD-10-CM | POA: Diagnosis not present

## 2024-01-29 DIAGNOSIS — R0602 Shortness of breath: Secondary | ICD-10-CM | POA: Insufficient documentation

## 2024-01-29 DIAGNOSIS — I10 Essential (primary) hypertension: Secondary | ICD-10-CM | POA: Diagnosis not present

## 2024-01-29 LAB — BASIC METABOLIC PANEL WITH GFR
Anion gap: 10 (ref 5–15)
BUN: 8 mg/dL (ref 6–20)
CO2: 25 mmol/L (ref 22–32)
Calcium: 9.2 mg/dL (ref 8.9–10.3)
Chloride: 106 mmol/L (ref 98–111)
Creatinine, Ser: 0.84 mg/dL (ref 0.44–1.00)
GFR, Estimated: >60 mL/min (ref >60)
Glucose, Bld: 87 mg/dL (ref 70–99)
Potassium: 3.5 mmol/L (ref 3.5–5.1)
Sodium: 141 mmol/L (ref 135–145)

## 2024-01-29 LAB — CBC
HCT: 38.6 % (ref 36.0–46.0)
Hemoglobin: 12.7 g/dL (ref 12.0–15.0)
MCH: 30.9 pg (ref 26.0–34.0)
MCHC: 32.9 g/dL (ref 30.0–36.0)
MCV: 93.9 fL (ref 80.0–100.0)
Platelets: 159 K/uL (ref 150–400)
RBC: 4.11 MIL/uL (ref 3.87–5.11)
RDW: 13.1 % (ref 11.5–15.5)
WBC: 8.5 K/uL (ref 4.0–10.5)
nRBC: 0 % (ref 0.0–0.2)

## 2024-01-29 LAB — URINALYSIS, ROUTINE W REFLEX MICROSCOPIC
Bilirubin Urine: NEGATIVE
Glucose, UA: NEGATIVE mg/dL
Hgb urine dipstick: NEGATIVE
Ketones, ur: NEGATIVE mg/dL
Leukocytes,Ua: NEGATIVE
Nitrite: NEGATIVE
Protein, ur: NEGATIVE mg/dL
Specific Gravity, Urine: 1.01 (ref 1.005–1.030)
pH: 6 (ref 5.0–8.0)

## 2024-01-29 LAB — D-DIMER, QUANTITATIVE: D-Dimer, Quant: 0.44 ug{FEU}/mL (ref 0.00–0.50)

## 2024-01-29 LAB — PREGNANCY, URINE: Preg Test, Ur: NEGATIVE

## 2024-01-29 LAB — TROPONIN T, HIGH SENSITIVITY: Troponin T High Sensitivity: <15 ng/L (ref 0–19)

## 2024-01-29 NOTE — ED Provider Notes (Signed)
 [place] [facility]PART[facility] AT [facility] Provider Note   CSN: [id number] Arrival date & time: 01/29/24  2029     Patient presents with: Shortness of Breath, Chest Pain (n), and Nausea   Kathleen Phillips is a 48 y.o. female hx of HTN, here with shortness of breath.  Patient has shortness of breath and some left-sided chest pain for several days.  Patient also had some PVCs yesterday.  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  Patient has no recent travel.    The history is provided by the patient.       Prior to Admission medications   Medication Sig Start Date End Date Taking? Authorizing Provider  AMBULATORY NON FORMULARY MEDICATION Tirzepitide compounded    [provider]  cariprazine  (VRAYLAR ) 3 MG capsule Take 1 capsule (3 mg total) by mouth daily. 10/22/23   Crain, Whitney L, PA  Cholecalciferol (VITAMIN D ) 50 MCG (2000 UT) tablet Take 2,000 Units by mouth daily.    [provider]  EPINEPHrine  0.3 mg/0.3 mL IJ SOAJ injection Inject 0.3 mg into the muscle as needed for anaphylaxis. 11/27/23   Breeback, Jade L, PA-C  hydrocortisone  (ANUSOL -HC) 25 MG suppository Place 1 suppository (25 mg total) rectally 2 (two) times daily as needed for hemorrhoids and rectal bleeding. 12/07/23   Breeback, Jade L, PA-C  levocetirizine (XYZAL ) 5 MG tablet Take 1 tablet (5 mg total) by mouth every evening. 05/25/23   Breeback, Jade L, PA-C  levothyroxine  (SYNTHROID ) 75 MCG tablet Take 1 tablet (75 mcg total) by mouth daily. 11/23/23   Breeback, Jade L, PA-C  meloxicam  (MOBIC ) 15 MG tablet Take 1 tablet (15 mg total) by mouth daily. 05/25/23   Breeback, Jade L, PA-C  methocarbamol  (ROBAXIN ) 500 MG tablet Take 1 tablet (500 mg total) by mouth 3 (three) times daily. 08/13/23   Willo Mini, NP  Multiple Vitamins-Minerals (MULTIVITAMIN WITH MINERALS) tablet Take 1 tablet by mouth daily.    [provider]  mupirocin ointment (BACTROBAN) 2 % Apply to affected area  TID for 7 days. 01/16/24   Crain, Whitney L, PA  nitrofurantoin , macrocrystal-monohydrate, (MACROBID ) 100 MG capsule Take 1 capsule (100 mg total) by mouth 2 (two) times daily. 11/23/23   Breeback, Jade L, PA-C  ondansetron  (ZOFRAN -ODT) 4 MG disintegrating tablet Take 1 tablet (4 mg total) by mouth 2 (two) times daily as needed for nausea. 11/14/23     ondansetron  (ZOFRAN -ODT) 8 MG disintegrating tablet Take 1 tablet (8 mg total) by mouth every 8 (eight) hours as needed for nausea. 03/20/22   Willo Mini, NP  rizatriptan  (MAXALT ) 10 MG tablet Take 1 tablet (10 mg total) by mouth as needed for migraine. May repeat in 2 hours if needed. 11/23/23   Breeback, Jade L, PA-C  topiramate  (TOPAMAX ) 100 MG tablet Take 1 tablet (100 mg total) by mouth daily. 05/25/23   Breeback, Jade L, PA-C  traMADol  (ULTRAM ) 50 MG tablet Take 1-2 tablets (50-100 mg total) by mouth every 12 (twelve) hours as needed for moderate pain (pain score 4-6). Maximum 6 tabs per day. 08/15/23   Breeback, Jade L, PA-C  triamcinolone  cream (KENALOG) 0.5 % Apply 1 Application topically 2 (two) times daily. Not to exceed 14 consecutive days 01/16/24   Crain, Whitney L, PA  valACYclovir  (VALTREX ) 500 MG tablet Take one tablet every 12 hours for 3 days for outbreaks. 07/02/20   Breeback, Jade L, PA-C  vortioxetine  HBr (TRINTELLIX ) 20 MG TABS tablet Take 1  tablet (20 mg total) by mouth daily. 01/07/24 04/22/24  Ezzard Staci SAILOR, NP    Allergies: Bupropion, Avocado, Ibuprofen, Other, and Tape    Review of Systems  Respiratory:  Positive for shortness of breath.   Cardiovascular:  Positive for chest pain.  All other systems reviewed and are negative.   Updated Vital Signs BP 124/89 (BP Location: Right Arm)   Pulse 71   Temp 98.2 F (36.8 C) (Oral)   Resp 16   Ht 5' 8 (1.727 m)   Wt 106.6 kg   SpO2 100%   BMI 35.73 kg/m   Physical Exam Vitals and nursing note reviewed.  Constitutional:      Appearance: She is well-developed.  HENT:      Head: Normocephalic.     Mouth/Throat:     Mouth: Mucous membranes are moist.     Pharynx: Oropharynx is clear.  Eyes:     Extraocular Movements: Extraocular movements intact.     Pupils: Pupils are equal, round, and reactive to light.  Cardiovascular:     Rate and Rhythm: Normal rate and regular rhythm.  Pulmonary:     Effort: Pulmonary effort is normal.     Breath sounds: Normal breath sounds.  Abdominal:     Palpations: Abdomen is soft.  Musculoskeletal:        General: Normal range of motion.     Cervical back: Normal range of motion and neck supple.  Skin:    General: Skin is warm.     Capillary Refill: Capillary refill takes less than 2 seconds.  Neurological:     General: No focal deficit present.     Mental Status: She is alert and oriented to person, place, and time.  Psychiatric:        Mood and Affect: Mood normal.        Behavior: Behavior normal.     (all labs ordered are listed, but only abnormal results are displayed) Labs Reviewed  CBC  BASIC METABOLIC PANEL WITH GFR  PREGNANCY, URINE  URINALYSIS, ROUTINE W REFLEX MICROSCOPIC  D-DIMER, QUANTITATIVE  TROPONIN T, HIGH SENSITIVITY    EKG: EKG Interpretation Date/Time:  Tuesday January 29 2024 20:45:34 EDT Ventricular Rate:  67 PR Interval:  175 QRS Duration:  96 QT Interval:  375 QTC Calculation: 396 R Axis:   81  Text Interpretation: Sinus rhythm Baseline wander in lead(s) I III aVL No significant change since last tracing Confirmed by Patt Alm DEL [phone]) on 01/29/2024 8:47:50 PM  Radiology: No results found.   Procedures   Medications Ordered in the ED - No data to display                                  Medical Decision Making Kathleen Phillips is a 48 y.o. female here presenting with shortness of breath and chest pain.  Patient had some PVCs per PCP.  Consider viral syndrome versus pneumonia versus ACS versus PE.  Plan to get labs and troponin and D-dimer and chest x-ray.  9:24  PM Reviewed patient's labs and they were really unremarkable.  In particular troponin is negative and D-dimer is negative.  Chest x-ray showed no pneumonia.  At this point patient is stable for discharge   Problems Addressed: Shortness of breath: acute illness or injury  Amount and/or Complexity of Data Reviewed Labs: ordered. Decision-making details documented in ED Course. Radiology: ordered and independent interpretation performed. Decision-making  details documented in ED Course.    Final diagnoses:  None    ED Discharge Orders     None          Patt Alm Macho, MD 01/29/24 2124

## 2024-01-29 NOTE — ED Notes (Signed)
 Pt transferred from WR to ED RM 7. Assuming pt care at this time.

## 2024-01-29 NOTE — ED Notes (Signed)
 Per Dr. Patt, 2nd troponin level is not needed. Order is to be discontinued.

## 2024-01-29 NOTE — ED Notes (Signed)
 Pt transported to xray at this time

## 2024-01-29 NOTE — ED Triage Notes (Signed)
 Pt reports difficulty taking deep breaths, nonradiating L sided CP, with nausea. Pt states she was throwing PVCs yesterday and today started having the CP/nausea/SOB while at work.

## 2024-01-29 NOTE — Discharge Instructions (Signed)
 As we discussed your heart enzyme tests and your labs are normal today  Follow-up with your doctor  Consider another Holter monitor if you have persistent shortness of breath or palpitations  Return to ER if you have worse shortness of breath or palpitations or chest pain

## 2024-01-30 ENCOUNTER — Ambulatory Visit (INDEPENDENT_AMBULATORY_CARE_PROVIDER_SITE_OTHER): Admitting: Physician Assistant

## 2024-01-30 ENCOUNTER — Other Ambulatory Visit (HOSPITAL_BASED_OUTPATIENT_CLINIC_OR_DEPARTMENT_OTHER): Payer: Self-pay

## 2024-01-30 ENCOUNTER — Ambulatory Visit: Attending: Physician Assistant

## 2024-01-30 ENCOUNTER — Encounter: Payer: Self-pay | Admitting: Physician Assistant

## 2024-01-30 VITALS — BP 109/74 | HR 70 | Ht 68.0 in | Wt 235.0 lb

## 2024-01-30 DIAGNOSIS — R079 Chest pain, unspecified: Secondary | ICD-10-CM | POA: Insufficient documentation

## 2024-01-30 DIAGNOSIS — K648 Other hemorrhoids: Secondary | ICD-10-CM

## 2024-01-30 DIAGNOSIS — F419 Anxiety disorder, unspecified: Secondary | ICD-10-CM

## 2024-01-30 DIAGNOSIS — R002 Palpitations: Secondary | ICD-10-CM | POA: Insufficient documentation

## 2024-01-30 DIAGNOSIS — K921 Melena: Secondary | ICD-10-CM

## 2024-01-30 DIAGNOSIS — F439 Reaction to severe stress, unspecified: Secondary | ICD-10-CM

## 2024-01-30 MED ORDER — CLONAZEPAM 0.5 MG PO TABS
0.5000 mg | ORAL_TABLET | Freq: Two times a day (BID) | ORAL | 1 refills | Status: AC | PRN
  Filled 2024-01-30: qty 20, 10d supply, fill #0

## 2024-01-30 NOTE — Progress Notes (Signed)
 Established Patient Office Visit  Subjective   Patient ID: Kathleen Phillips, female    DOB: Feb 05, 1976  Age: 48 y.o. MRN: [id number]  Chief Complaint  Patient presents with   Chest Pain    hx of HTN, here with shortness of breath.  Patient has shortness of breath and some left-sided chest pain for several days.  Patient also had some PVCs yesterday.     HPI .SABRADiscussed the use of AI scribe software for clinical note transcription with the patient, who gave verbal consent to proceed.  History of Present Illness Kathleen Phillips is a 48 year old female who presents with left-sided chest pain and palpitations for 2 days.   Chest pain and dyspnea - Left-sided chest pain present for two days prior to emergency department visit on January 29, 2024 - Pain described as an ache, started while lying down, intensified over time, particularly at rest - Pain radiates into left shoulder and is tender to palpation - Associated with shortness of breath - Premature ventricular contractions (PVCs) described as a flutter in her heart, causing her to catch her breath - No history of heavy lifting or physical exertion preceding onset - Methocarbamol  provided some relief - No change in usual reflux symptoms  Emergency department evaluation - Workup included CBC, BMP, D-dimer, troponin, and EKG - EKG showed sinus rhythm - Chest x-ray was normal - D-dimer and troponin were negative - Symptoms persisted despite negative workup  Psychological stress - High stress levels related to husband's recent surgery and caregiving responsibilities at home  Rectal bleeding - Rectal bleeding with nearly every bowel movement - Suppositories used without relief - No new gastrointestinal symptoms - No increase in reflux symptoms - Last colonoscopy 2019   ROS See HPI.    Objective:     BP 109/74   Pulse 70   Ht 5' 8 (1.727 m)   Wt 235 lb (106.6 kg)   SpO2 99%   BMI 35.73 kg/m  BP Readings from  Last 3 Encounters:  01/30/24 109/74  01/29/24 122/84  01/16/24 104/70   Wt Readings from Last 3 Encounters:  01/30/24 235 lb (106.6 kg)  01/29/24 235 lb (106.6 kg)  01/16/24 230 lb (104.3 kg)      Physical Exam Constitutional:      Appearance: She is well-developed.  HENT:     Head: Normocephalic.  Cardiovascular:     Heart sounds: Normal heart sounds.  Pulmonary:     Effort: Pulmonary effort is normal.  Chest:     Chest wall: No tenderness.  Neurological:     Mental Status: She is alert.  Psychiatric:        Mood and Affect: Mood normal.       The 10-year ASCVD risk score (Arnett DK, et al., 2019) is: 0.5%    Assessment & Plan:  SABRASABRAChristal was seen today for chest pain.  Diagnoses and all orders for this visit:  Palpitations -     LONG TERM MONITOR (3-14 DAYS); Future  Hematochezia -     Ambulatory referral to Gastroenterology  Internal hemorrhoids -     Ambulatory referral to Gastroenterology  Left-sided chest pain -     LONG TERM MONITOR (3-14 DAYS); Future  Stress  Anxiety -     clonazePAM (KLONOPIN) 0.5 MG tablet; Take 1 tablet (0.5 mg total) by mouth 2 (two) times daily as needed for anxiety.   Assessment & Plan Left-sided chest pain and palpitations (possible PVCs) Intermittent left-sided chest  pain and palpitations with negative ED workup. Tenderness suggests muscle spasm or stress-related etiology. Symptoms suggestive of PVCs or muscle spasms. Stress is a significant factor. - Refer for Zio patch to monitor heart rhythm. - Advise use of methocarbamol  as needed for muscle relaxation. - Recommend warm compresses over the affected area. - Prescribe clonazepam as needed for anxiety and stress relief.  Rectal bleeding Rectal bleeding with every bowel movement. Possible internal hemorrhoids or other causes need to be ruled out. - Refer for colonoscopy.  Internal hemorrhoids Internal hemorrhoids suspected as a cause of rectal bleeding. Previous  treatment with suppositories has been ineffective.  Generalized anxiety and situational stress Significant situational stress due to personal circumstances. Stress may be contributing to chest pain and palpitations. - Prescribe clonazepam as needed for anxiety and stress relief.  Depression Currently managed with Trintellix  and Vraylar .  - Continue Trintellix   and Vraylar  as prescribed.    [name], PA-C

## 2024-01-30 NOTE — Patient Instructions (Signed)
 Referral placed to GI.  Zio patch ordered for palpitations and chest pain.  Clonazepam as needed for stress and anxiety

## 2024-01-30 NOTE — Progress Notes (Unsigned)
 EP to read.

## 2024-02-06 ENCOUNTER — Encounter: Payer: Self-pay | Admitting: Professional

## 2024-02-06 ENCOUNTER — Ambulatory Visit (INDEPENDENT_AMBULATORY_CARE_PROVIDER_SITE_OTHER): Admitting: Professional

## 2024-02-06 DIAGNOSIS — F411 Generalized anxiety disorder: Secondary | ICD-10-CM

## 2024-02-06 DIAGNOSIS — F3181 Bipolar II disorder: Secondary | ICD-10-CM | POA: Diagnosis not present

## 2024-02-06 NOTE — Progress Notes (Signed)
 [facility] Counselor/Therapist Progress Note  Patient ID: Kathleen Phillips, MRN: [id number],    Date: 02/06/2024  Time Spent: 56 minutes 303-359pm   Treatment Type: Individual Therapy  Risk Assessment: Danger to Self:  No Self-injurious Behavior: No Danger to Others: No  Subjective: This session was held via video teletherapy. The patient consented to video teletherapy and was located in her car for this session. She is aware it is the responsibility of the patient to secure confidentiality on her end of the session. The provider was in a private home office for the duration of this session.    The patient arrived on time for her Caregility appointment.  Issues Addressed: 1-pt has taken more steps toward action steps -she got the number to the [facility] to get some information about being able to leave -she got him some information about getting him a paid caregiver -she has looked at some storage places and some apartments -she has mixed feelings because the expense is so great -she has not yet discussed with Redell about his clutter being a trigger 2-physical -pt has had heart palpitations, chest pains than worsened over two days -she did nothing to try and relax but it got worse and worse -her mother died of cardiac event in her sleep -pt saw PCP who suggested a heart monitor that she has just gotten -pt cut back on caffeine -pt thinks it is the stress of her home situation -she was prescribed Klonopin and she took the night of her appointment and slept and felt so much better the next days -she only took that one time but continues to have sleep issues -the stress is not as great as before she went to the ED -pt thinks her stress is greater financially -pt is using her budgeted money for car payment for food and now her car is going to be repossessed -she has talked to the lender and they have made a plan to get her caught up 3-marital -things  are not good -Redell is her bff and he asked her to not have any contact with Redell -he told her to tell Redell she said hi -she thinks he notices that he is not coddling him, that she is more hands off -last night in bed and heard him coughing and got him cough syrup because she didn't want to hear him cough all night -pt reports everytime he moved she is awakening and she is not getting sleep -anytime the cat gets on [place] he starts talking to the cat -she could sleep on the couch so she could try and sleep -there is no intimacy between them, only them sitting on the sofa with an arm rest between them -plans to be in home until she graduates in summer 2026 unless something drastic happens (him throwing another fit and tells me to leave and this time me being willing to go) -pt doesn't know what sets him off; he's been told he's bipolar and refuses to take medications     Treatment Plan Problems: Balancing Work and [employer], Caregiving of Aging Parents, Menopause and Perimenopause Symptoms: Experiences stress associated with balancing the multiple tasks associated with motherhood and work roles. Experiences role conflict between two or more roles (i.e., obligations or responsibilities for one role interfere with ability to perform duties for other roles). Describes stress and anxiety associated with cumulative demands of multiple roles. Experiences fatigue and difficulty concentrating (e.g., feeling overwhelmed) due to role overload. Experiences partner relationship strain  and dissatisfaction due to inequitable division of responsibilities. Exhibits anxiety and grief related to change in parent-child dynamics due to assuming caregiving responsibilities for parents. Experiences stress and frustration associated with caregiving responsibilities. Experiences psychological distress associated with unfinished business as it relates to aging parent(s) (e.g., parent-child conflict,  sibling relationships, familial role expectations). Demonstrates difficulty managing caregiving with existing roles and responsibilities (i.e., home, family, and work). Experiences physiological indicators of menopause (e.g., hot flashes, heavy and/or irregular periods, vaginal dryness, night sweats, heart palpitations, migraine headaches, weight gain, fibroids, change in libido, urinary symptoms, breast swelling/tenderness, insomnia, skin changes). Internalizes and verbalizes negative attitudes and a lowered self-esteem related to menopause and aging. Evidences a loss of interest in sex or romantic relationships. Goals: Maintain a balance between the multiple demands of motherhood, work, and other life roles and responsibilities. Develop a social support network for self and aging parent(s). Develop a balance between caregiving responsibilities and other roles, responsibilities, and interests. Resolve associated grief and anxiety and adapt to new caregiving role and life circumstances. Resolve the psychological effects of menopause, including depression, anxiety, and body image concerns. Adapt to the normal physical changes that occur with the onset and course of menopause. Objectives target date for all objectives is 11/13/2024: Describe role and responsibilities associated with work and family and related thoughts, feelings, and behaviors. Implement assertiveness skills and limit setting. Learn and implement stress management and relaxation techniques to reduce fatigue, anxiety, and depressive symptoms. Communicate needs with partner regarding multiple role obligations. Protect each role from interfering with the other. Identify and participate in theme support groups. Commit to a healthy eating, sleeping, and exercise routine. Describe family history and details of current caretaking situation. Develop effective communication skills and boundaries that will facilitate a harmonious  relationship and caregiving of aging parent. Increase social support contacts to reduce isolation and stress related to new role expectations. Implement stress management practices aimed at a restoration of physical and emotional balance. Identify coping strategies to effectively process inevitable loss of parent(s). Differentiate between which midlife symptoms are due to perimenopause/ menopause and which may simply be concurrent developmental changes. Implement at least two strategies to relieve insomnia. Implement at least two strategies to help reduce hot flashes. Intervention: Refer the client to read books such as The Wisdom of Menopause  by Lonzo, The Sexy Years by Donelda, and a book of essays called The Ageless Spirit: Reflections on Living Life to the Fullest in Midlife and the Years Beyond by [place]. Discuss with the client her process of self-assessment and reexamination and the positive implications for the future (e.g., achieved more experience, competence, and greater freedom). Explore options (e.g., supplements/herbs, oral contraceptives, HRT) for reducing hot flashes, which can adversely affect sleep and performing daily tasks. Educate the client on the relationship between insomnia/loss of sleep and its impact on overall cognitive functioning (e.g., concentration, memory, clarity of thought) and emotional functioning (e.g., irritability, depressive symptoms). Encourage the client to implement new sleep induction habits (e.g., exercise early in the day, take a warm bath before retiring, drink chamomile tea, read, engage in meditation/yoga). Encourage the client to eat a healthy diet that is low in fat and cholesterol; moderate in total fat; high in fiber; high in fruits, vegetables, and whole-grain foods; and well-balanced in vitamins and minerals, including calcium. Assist the client in planning an exercise/yoga/medication routine that will be beneficial in reducing depressive  symptoms, anxiety, and stress, as well as the risks of osteoporosis, heart disease, and weight gain/body  image issues associated with menopause. Explore with the client her multiple roles and responsibilities associated with work and family; clarify related thoughts and feelings. Explore with the client her expectations regarding the balance between work and family, and discuss how this relates to internalized gender role stereotypes. Assist the client in identifying the causes and consequences of at least two internal and two external expectations associated with balancing work and family roles. Recommend that the client read and implement programs from Exercising Your Way to Better Mental Health by Noe. Explore potential self-care activities and encourage the client's participation (or assign Identify and Schedule Pleasant Activities in the Adult Psychotherapy Homework Planner, 2nd ed. by Jenniffer). Use behavioral techniques (i.e., education, modeling, role-playing, corrective feedback, positive reinforcement) to teach communication skills, including assertive communication, offering positive feedback, active listening, making positive requests of others for behavior change, and giving negative feedback in an honest and respectful manner. Explore, along with the client, strategies for making her life more manageable and less stressful (e.g., waking up before the children to exercise or have a cup of tea, pack the children's lunches and backpacks the night before). Encourage the client to develop a schedule of roles and responsibilities with her partner; assign her to meet regularly with her partner to discuss, review, and revise the schedule as needed. Document the client's family history, including age, health, and relationship dynamics of her parent(s), sibling(s), child(ren), and partner(s). Explore the details of the client's caregiving duties for her parents. Assist the client with generating a  realistic and effective time management schedule. Assist the client in developing an awareness of automatic thoughts that reflect unrealistic expectations regarding caring for aging parents. Do behavioral experiments in which unrealistic automatic thoughts are treated as hypotheses/predictions, reality-based alternative hypotheses/predictions are generated, and both are tested against the client's past, present, and/or future experiences. Reinforce the client's positive, reality-based cognitive messages that enhance self-confidence and increase adaptive action (see Positive Self-Talk in the Adult Psychotherapy Homework Planner, 2nd ed. by Jenniffer).  Diagnosis:Mixed bipolar II disorder (HCC)  Generalized anxiety disorder  Plan:  -meet again on Wednesday, February 20, 2024 at [employer].

## 2024-02-15 ENCOUNTER — Ambulatory Visit (INDEPENDENT_AMBULATORY_CARE_PROVIDER_SITE_OTHER): Admitting: Family Medicine

## 2024-02-15 VITALS — BP 117/82 | HR 82 | Ht 68.0 in | Wt 235.0 lb

## 2024-02-15 DIAGNOSIS — R079 Chest pain, unspecified: Secondary | ICD-10-CM | POA: Diagnosis not present

## 2024-02-15 DIAGNOSIS — R232 Flushing: Secondary | ICD-10-CM

## 2024-02-15 DIAGNOSIS — R42 Dizziness and giddiness: Secondary | ICD-10-CM | POA: Diagnosis not present

## 2024-02-15 NOTE — Progress Notes (Signed)
 Acute Office Visit  Patient ID: Kathleen Phillips, female    DOB: 1976/02/14, 48 y.o.   MRN: [id number]  PCP: Antoniette Vermell CROME, PA-C  Chief Complaint  Patient presents with   Chest Pain    Subjective:     HPI  48 year old female patient of [name] was at work today when she started experiencing some chest pain.  She has seen her PCP for these recent experiences which are occurring daily she often feels flushed when it occurs.  Is not necessarily with position change it can just happen at rest.  She says she has had some similar sensations in the past but it is been years and had a negative workup except for maybe some PVCs years ago.  She did lay down after she started to feel dizzy at work and when she laid flat it almost intensified the dizziness.  She did check her blood pressure right before it happened and it looked good at 117/82 which she says is a little bit high for her.  She has eaten and did hydrate today.  She felt very flushed and her facial cheeks were red and warm.  History of elevated lipid levels.   ROS     Objective:    BP 117/82 (BP Location: Left Arm, Patient Position: Standing, Cuff Size: Large)   Pulse 82   Ht 5' 8 (1.727 m)   Wt 235 lb (106.6 kg)   SpO2 99%   BMI 35.73 kg/m    Physical Exam Vitals and nursing note reviewed.  Constitutional:      Appearance: Normal appearance.  HENT:     Head: Normocephalic and atraumatic.     Comments: Facial cheeks are red and warm. Eyes:     Conjunctiva/sclera: Conjunctivae normal.  Neck:     Vascular: No carotid bruit.  Cardiovascular:     Rate and Rhythm: Normal rate and regular rhythm.  Pulmonary:     Effort: Pulmonary effort is normal.     Breath sounds: Normal breath sounds.  Skin:    General: Skin is warm and dry.  Neurological:     Mental Status: She is alert.  Psychiatric:        Mood and Affect: Mood normal.       No results found for any visits on 02/15/24.     Assessment & Plan:    Problem List Items Addressed This Visit   None Visit Diagnoses       Chest pain, unspecified type    -  Primary   Relevant Orders   EKG 12-Lead   ECHOCARDIOGRAM COMPLETE   CT CARDIAC SCORING (SELF PAY ONLY)     Lightheadedness       Relevant Orders   ECHOCARDIOGRAM COMPLETE   CT CARDIAC SCORING (SELF PAY ONLY)     Flushing       Relevant Orders   ECHOCARDIOGRAM COMPLETE   CT CARDIAC SCORING (SELF PAY ONLY)      She was still having some chest discomfort while we perform the EKG today.    EKG shows rate of 77 bpm, normal sinus rhythm with no acute ST-T waves.    She actually just got through wearing a heart monitor which she took off yesterday and actually put back into the mail today so those results are pending.  We discussed further workup with echocardiogram and possibly even cardiac CT as her mom passed at a young age from a sudden cardiac event.  Not clear if it  was from coronary artery blockage or not.  If workup is overall normal then consider that this could be somewhat stress related since she has had a lot of increased dressers.  Her husband's health is declining and she is very worried about her future.  We also discussed possible cardiology referral in future  No orders of the defined types were placed in this encounter.   No follow-ups on file.  Dorothyann Byars, MD [facility] at [facility]

## 2024-02-20 ENCOUNTER — Other Ambulatory Visit: Payer: Self-pay

## 2024-02-20 ENCOUNTER — Ambulatory Visit: Payer: Self-pay | Admitting: Family Medicine

## 2024-02-20 ENCOUNTER — Encounter: Payer: Self-pay | Admitting: Professional

## 2024-02-20 ENCOUNTER — Ambulatory Visit (INDEPENDENT_AMBULATORY_CARE_PROVIDER_SITE_OTHER): Admitting: Professional

## 2024-02-20 DIAGNOSIS — F3181 Bipolar II disorder: Secondary | ICD-10-CM | POA: Diagnosis not present

## 2024-02-20 DIAGNOSIS — R079 Chest pain, unspecified: Secondary | ICD-10-CM

## 2024-02-20 DIAGNOSIS — F5101 Primary insomnia: Secondary | ICD-10-CM | POA: Diagnosis not present

## 2024-02-20 DIAGNOSIS — R42 Dizziness and giddiness: Secondary | ICD-10-CM

## 2024-02-20 DIAGNOSIS — R232 Flushing: Secondary | ICD-10-CM

## 2024-02-20 DIAGNOSIS — F411 Generalized anxiety disorder: Secondary | ICD-10-CM

## 2024-02-20 NOTE — Progress Notes (Signed)
 [facility] Counselor/Therapist Progress Note  Patient ID: Kathleen Phillips, MRN: [id number],    Date: 02/20/2024  Time Spent: 47 minutes 303-350pm   Treatment Type: Individual Therapy  Risk Assessment: Danger to Self:  No Self-injurious Behavior: No Danger to Others: No  Subjective: This session was held via video teletherapy. The patient consented to video teletherapy and was located in her car for this session. She is aware it is the responsibility of the patient to secure confidentiality on her end of the session. The provider was in a private home office for the duration of this session.    The patient arrived on time for her Caregility appointment.  Issues Addressed: 1-planning move a-pt has been looking into apartments -has been looking into her own auto insurance with renters insurance b-talked with brother about potential to stay with him for a few months -he said he would check with his wife -her brother used to call her delicate and pt addressed that it means bipolar -she educated him on her wellness and dedication to medication that allows her to remain stable c-she finally made the decision when she smelled alcohol on his breath d-pt unsure of readiness to commit to rent due to uncertainty of school loan pymt -pt admits that she packed a bag and was going to leave to get out of the house and stay with brother -she had considered then calling her spouse and discussing 2-marital -husband has been having people come in and give estimates on him getting a new shower -pt unsupportive of him borrowing against their home and she is unwilling to sign anything 3-Brian -went silent on her -Saturday he gave her the I don't wanna talk and I'll let you know when I want to talk again -pt thought about reaching out and telling him she missed their communication -she is not going to reach out since his last words were he let her know when he wants to talk to  her -pt doesn't want anything to mess up their friendship since they have been friends for twenty years 4-filling in time since no contact with Redell by doing her reading for school and then writing papers in the evening -pt sees that her husband has emotionally and physically abandoned me in their marriage -he does what he wants and is able to -he wanted a peanut butter sandwich and when he would not fix he became angry  Treatment Plan Problems: Balancing Work and [employer], Caregiving of Aging Parents, Menopause and Perimenopause Symptoms: Experiences stress associated with balancing the multiple tasks associated with motherhood and work roles. Experiences role conflict between two or more roles (i.e., obligations or responsibilities for one role interfere with ability to perform duties for other roles). Describes stress and anxiety associated with cumulative demands of multiple roles. Experiences fatigue and difficulty concentrating (e.g., feeling overwhelmed) due to role overload. Experiences partner relationship strain and dissatisfaction due to inequitable division of responsibilities. Exhibits anxiety and grief related to change in parent-child dynamics due to assuming caregiving responsibilities for parents. Experiences stress and frustration associated with caregiving responsibilities. Experiences psychological distress associated with unfinished business as it relates to aging parent(s) (e.g., parent-child conflict, sibling relationships, familial role expectations). Demonstrates difficulty managing caregiving with existing roles and responsibilities (i.e., home, family, and work). Experiences physiological indicators of menopause (e.g., hot flashes, heavy and/or irregular periods, vaginal dryness, night sweats, heart palpitations, migraine headaches, weight gain, fibroids, change in libido, urinary symptoms, breast swelling/tenderness, insomnia, skin  changes). Internalizes and  verbalizes negative attitudes and a lowered self-esteem related to menopause and aging. Evidences a loss of interest in sex or romantic relationships. Goals: Maintain a balance between the multiple demands of motherhood, work, and other life roles and responsibilities. Develop a social support network for self and aging parent(s). Develop a balance between caregiving responsibilities and other roles, responsibilities, and interests. Resolve associated grief and anxiety and adapt to new caregiving role and life circumstances. Resolve the psychological effects of menopause, including depression, anxiety, and body image concerns. Adapt to the normal physical changes that occur with the onset and course of menopause. Objectives target date for all objectives is 11/13/2024: Describe role and responsibilities associated with work and family and related thoughts, feelings, and behaviors. Implement assertiveness skills and limit setting. Learn and implement stress management and relaxation techniques to reduce fatigue, anxiety, and depressive symptoms. Communicate needs with partner regarding multiple role obligations. Protect each role from interfering with the other. Identify and participate in theme support groups. Commit to a healthy eating, sleeping, and exercise routine. Describe family history and details of current caretaking situation. Develop effective communication skills and boundaries that will facilitate a harmonious relationship and caregiving of aging parent. Increase social support contacts to reduce isolation and stress related to new role expectations. Implement stress management practices aimed at a restoration of physical and emotional balance. Identify coping strategies to effectively process inevitable loss of parent(s). Differentiate between which midlife symptoms are due to perimenopause/ menopause and which may simply be concurrent developmental  changes. Implement at least two strategies to relieve insomnia. Implement at least two strategies to help reduce hot flashes. Intervention: Refer the client to read books such as The Wisdom of Menopause  by Lonzo, The Sexy Years by Donelda, and a book of essays called The Ageless Spirit: Reflections on Living Life to the Fullest in Midlife and the Years Beyond by [place]. Discuss with the client her process of self-assessment and reexamination and the positive implications for the future (e.g., achieved more experience, competence, and greater freedom). Explore options (e.g., supplements/herbs, oral contraceptives, HRT) for reducing hot flashes, which can adversely affect sleep and performing daily tasks. Educate the client on the relationship between insomnia/loss of sleep and its impact on overall cognitive functioning (e.g., concentration, memory, clarity of thought) and emotional functioning (e.g., irritability, depressive symptoms). Encourage the client to implement new sleep induction habits (e.g., exercise early in the day, take a warm bath before retiring, drink chamomile tea, read, engage in meditation/yoga). Encourage the client to eat a healthy diet that is low in fat and cholesterol; moderate in total fat; high in fiber; high in fruits, vegetables, and whole-grain foods; and well-balanced in vitamins and minerals, including calcium. Assist the client in planning an exercise/yoga/medication routine that will be beneficial in reducing depressive symptoms, anxiety, and stress, as well as the risks of osteoporosis, heart disease, and weight gain/body image issues associated with menopause. Explore with the client her multiple roles and responsibilities associated with work and family; clarify related thoughts and feelings. Explore with the client her expectations regarding the balance between work and family, and discuss how this relates to internalized gender role stereotypes. Assist the  client in identifying the causes and consequences of at least two internal and two external expectations associated with balancing work and family roles. Recommend that the client read and implement programs from Exercising Your Way to Better Mental Health by Noe. Explore potential self-care activities and encourage the client's participation (or assign Identify and Schedule  Pleasant Activities in the Adult Psychotherapy Homework Planner, 2nd ed. by Jenniffer). Use behavioral techniques (i.e., education, modeling, role-playing, corrective feedback, positive reinforcement) to teach communication skills, including assertive communication, offering positive feedback, active listening, making positive requests of others for behavior change, and giving negative feedback in an honest and respectful manner. Explore, along with the client, strategies for making her life more manageable and less stressful (e.g., waking up before the children to exercise or have a cup of tea, pack the children's lunches and backpacks the night before). Encourage the client to develop a schedule of roles and responsibilities with her partner; assign her to meet regularly with her partner to discuss, review, and revise the schedule as needed. Document the client's family history, including age, health, and relationship dynamics of her parent(s), sibling(s), child(ren), and partner(s). Explore the details of the client's caregiving duties for her parents. Assist the client with generating a realistic and effective time management schedule. Assist the client in developing an awareness of automatic thoughts that reflect unrealistic expectations regarding caring for aging parents. Do behavioral experiments in which unrealistic automatic thoughts are treated as hypotheses/predictions, reality-based alternative hypotheses/predictions are generated, and both are tested against the client's past, present, and/or future  experiences. Reinforce the client's positive, reality-based cognitive messages that enhance self-confidence and increase adaptive action (see Positive Self-Talk in the Adult Psychotherapy Homework Planner, 2nd ed. by Jenniffer).  Diagnosis:Mixed bipolar II disorder (HCC)  Generalized anxiety disorder  Primary insomnia  Plan:  -continue working to execute her plan to leave her spouse -meet again on Wednesday, March 19, 2024 at [employer].

## 2024-02-20 NOTE — Progress Notes (Signed)
 Hi Kathleen Phillips. Your calcium score was 0!!! That is awesome!!! No major plaque build up in your coronaries.  That is really good news

## 2024-02-21 DIAGNOSIS — R002 Palpitations: Secondary | ICD-10-CM | POA: Diagnosis not present

## 2024-02-21 DIAGNOSIS — R079 Chest pain, unspecified: Secondary | ICD-10-CM | POA: Diagnosis not present

## 2024-02-21 NOTE — Progress Notes (Signed)
 Did an over read on the surrounding tissues on the coronary calcium score and everything looks good there as well.

## 2024-03-05 ENCOUNTER — Ambulatory Visit: Admitting: Professional

## 2024-03-12 ENCOUNTER — Other Ambulatory Visit (HOSPITAL_BASED_OUTPATIENT_CLINIC_OR_DEPARTMENT_OTHER): Payer: Self-pay

## 2024-03-12 DIAGNOSIS — K625 Hemorrhage of anus and rectum: Secondary | ICD-10-CM | POA: Diagnosis not present

## 2024-03-12 DIAGNOSIS — R6881 Early satiety: Secondary | ICD-10-CM | POA: Diagnosis not present

## 2024-03-12 DIAGNOSIS — R110 Nausea: Secondary | ICD-10-CM | POA: Diagnosis not present

## 2024-03-12 MED ORDER — NA SULFATE-K SULFATE-MG SULF 17.5-3.13-1.6 GM/177ML PO SOLN
ORAL | 0 refills | Status: DC
  Filled 2024-03-12: qty 354, 1d supply, fill #0

## 2024-03-13 DIAGNOSIS — R079 Chest pain, unspecified: Secondary | ICD-10-CM | POA: Diagnosis not present

## 2024-03-13 DIAGNOSIS — R002 Palpitations: Secondary | ICD-10-CM | POA: Diagnosis not present

## 2024-03-17 ENCOUNTER — Ambulatory Visit: Payer: Self-pay | Admitting: Physician Assistant

## 2024-03-17 DIAGNOSIS — R002 Palpitations: Secondary | ICD-10-CM

## 2024-03-17 DIAGNOSIS — R079 Chest pain, unspecified: Secondary | ICD-10-CM

## 2024-03-17 NOTE — Progress Notes (Signed)
 Ramata,   Cardiology will go over with you.  Symptoms trigger episodes you were in normal rhythm.  No sustained arrhythmias which is great news.  Rare abnormal beats.

## 2024-03-18 ENCOUNTER — Ambulatory Visit (HOSPITAL_BASED_OUTPATIENT_CLINIC_OR_DEPARTMENT_OTHER)

## 2024-03-18 NOTE — Progress Notes (Signed)
 I placed referral.

## 2024-03-19 ENCOUNTER — Ambulatory Visit (HOSPITAL_BASED_OUTPATIENT_CLINIC_OR_DEPARTMENT_OTHER): Admission: RE | Admit: 2024-03-19 | Discharge: 2024-03-19 | Attending: Family Medicine | Admitting: Family Medicine

## 2024-03-19 ENCOUNTER — Encounter: Payer: Self-pay | Admitting: Professional

## 2024-03-19 ENCOUNTER — Ambulatory Visit: Admitting: Professional

## 2024-03-19 DIAGNOSIS — R079 Chest pain, unspecified: Secondary | ICD-10-CM | POA: Diagnosis not present

## 2024-03-19 DIAGNOSIS — F3181 Bipolar II disorder: Secondary | ICD-10-CM

## 2024-03-19 DIAGNOSIS — R232 Flushing: Secondary | ICD-10-CM | POA: Diagnosis not present

## 2024-03-19 DIAGNOSIS — F411 Generalized anxiety disorder: Secondary | ICD-10-CM

## 2024-03-19 DIAGNOSIS — R42 Dizziness and giddiness: Secondary | ICD-10-CM
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
## 2024-03-20 LAB — ECHOCARDIOGRAM COMPLETE
AR max vel: 2.63 cm2
AV Area VTI: 2.49 cm2
AV Area mean vel: 2.53 cm2
AV Mean grad: 3 mmHg
AV Peak grad: 5.3 mmHg
Ao pk vel: 1.16 m/s
Area-P 1/2: 4.96 cm2
Calc EF: 59 %
MV M vel: 2.5 m/s
MV Peak grad: 25 mmHg
S' Lateral: 2.4 cm
Single Plane A2C EF: 61.8 %
Single Plane A4C EF: 57.5 %

## 2024-03-21 NOTE — Progress Notes (Signed)
 Good morning, pumping function of your heart looks great with an EF to 65% this is normal.  The walls are also moving normally so no sign of damage.  You have just a little time a tiny bit of backflow on the mitral valve but not anything that is causing a problem and nothing worrisome.  The aortic valve looks great.  Overall the heart looks great on exam.

## 2024-03-25 ENCOUNTER — Other Ambulatory Visit (HOSPITAL_COMMUNITY): Payer: Self-pay

## 2024-03-25 ENCOUNTER — Other Ambulatory Visit: Payer: Self-pay

## 2024-03-25 ENCOUNTER — Other Ambulatory Visit (HOSPITAL_BASED_OUTPATIENT_CLINIC_OR_DEPARTMENT_OTHER): Payer: Self-pay

## 2024-03-31 ENCOUNTER — Other Ambulatory Visit (HOSPITAL_COMMUNITY): Payer: Self-pay

## 2024-04-09 ENCOUNTER — Other Ambulatory Visit (HOSPITAL_COMMUNITY): Payer: Self-pay

## 2024-04-09 ENCOUNTER — Ambulatory Visit: Admitting: Professional

## 2024-04-09 DIAGNOSIS — M722 Plantar fascial fibromatosis: Secondary | ICD-10-CM | POA: Insufficient documentation

## 2024-04-09 DIAGNOSIS — M79671 Pain in right foot: Secondary | ICD-10-CM | POA: Insufficient documentation

## 2024-04-09 MED ORDER — CELECOXIB 200 MG PO CAPS
200.0000 mg | ORAL_CAPSULE | Freq: Every day | ORAL | 3 refills | Status: AC | PRN
  Filled 2024-04-09: qty 180, 90d supply, fill #0

## 2024-04-16 DIAGNOSIS — F32A Depression, unspecified: Secondary | ICD-10-CM | POA: Insufficient documentation

## 2024-04-16 DIAGNOSIS — F419 Anxiety disorder, unspecified: Secondary | ICD-10-CM | POA: Insufficient documentation

## 2024-04-16 DIAGNOSIS — R55 Syncope and collapse: Secondary | ICD-10-CM | POA: Insufficient documentation

## 2024-04-16 DIAGNOSIS — R87629 Unspecified abnormal cytological findings in specimens from vagina: Secondary | ICD-10-CM | POA: Insufficient documentation

## 2024-04-16 DIAGNOSIS — G43909 Migraine, unspecified, not intractable, without status migrainosus: Secondary | ICD-10-CM | POA: Insufficient documentation

## 2024-04-16 DIAGNOSIS — M7989 Other specified soft tissue disorders: Secondary | ICD-10-CM | POA: Insufficient documentation

## 2024-04-20 ENCOUNTER — Other Ambulatory Visit (HOSPITAL_COMMUNITY): Payer: Self-pay | Admitting: Family

## 2024-04-20 ENCOUNTER — Other Ambulatory Visit: Payer: Self-pay | Admitting: Physician Assistant

## 2024-04-20 ENCOUNTER — Other Ambulatory Visit (HOSPITAL_COMMUNITY): Payer: Self-pay

## 2024-04-20 DIAGNOSIS — F3181 Bipolar II disorder: Secondary | ICD-10-CM

## 2024-04-20 DIAGNOSIS — F5101 Primary insomnia: Secondary | ICD-10-CM

## 2024-04-20 DIAGNOSIS — F419 Anxiety disorder, unspecified: Secondary | ICD-10-CM

## 2024-04-20 DIAGNOSIS — G43009 Migraine without aura, not intractable, without status migrainosus: Secondary | ICD-10-CM

## 2024-04-21 ENCOUNTER — Other Ambulatory Visit: Payer: Self-pay

## 2024-04-21 ENCOUNTER — Other Ambulatory Visit (HOSPITAL_COMMUNITY): Payer: Self-pay

## 2024-04-21 MED ORDER — TOPIRAMATE 100 MG PO TABS
100.0000 mg | ORAL_TABLET | Freq: Every day | ORAL | 3 refills | Status: AC
  Filled 2024-04-21: qty 90, 90d supply, fill #0

## 2024-04-23 ENCOUNTER — Encounter: Payer: Self-pay | Admitting: Professional

## 2024-04-23 ENCOUNTER — Ambulatory Visit: Admitting: Professional

## 2024-04-23 DIAGNOSIS — F3181 Bipolar II disorder: Secondary | ICD-10-CM | POA: Diagnosis not present

## 2024-04-23 DIAGNOSIS — F411 Generalized anxiety disorder: Secondary | ICD-10-CM | POA: Diagnosis not present

## 2024-04-23 DIAGNOSIS — F5101 Primary insomnia: Secondary | ICD-10-CM
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
## 2024-04-24 ENCOUNTER — Other Ambulatory Visit (HOSPITAL_BASED_OUTPATIENT_CLINIC_OR_DEPARTMENT_OTHER): Payer: Self-pay | Admitting: Physician Assistant

## 2024-04-24 ENCOUNTER — Ambulatory Visit

## 2024-04-24 ENCOUNTER — Other Ambulatory Visit (HOSPITAL_BASED_OUTPATIENT_CLINIC_OR_DEPARTMENT_OTHER): Payer: Self-pay

## 2024-04-24 VITALS — BP 100/76 | HR 73 | Ht 68.5 in | Wt 225.0 lb

## 2024-04-24 DIAGNOSIS — R002 Palpitations: Secondary | ICD-10-CM | POA: Diagnosis not present

## 2024-04-24 DIAGNOSIS — I340 Nonrheumatic mitral (valve) insufficiency: Secondary | ICD-10-CM | POA: Insufficient documentation

## 2024-04-24 DIAGNOSIS — R110 Nausea: Secondary | ICD-10-CM

## 2024-04-24 DIAGNOSIS — R0609 Other forms of dyspnea: Secondary | ICD-10-CM | POA: Diagnosis not present

## 2024-04-24 DIAGNOSIS — R072 Precordial pain: Secondary | ICD-10-CM | POA: Insufficient documentation

## 2024-04-24 DIAGNOSIS — R6881 Early satiety: Secondary | ICD-10-CM

## 2024-04-24 MED ORDER — METOPROLOL TARTRATE 100 MG PO TABS
100.0000 mg | ORAL_TABLET | Freq: Once | ORAL | 0 refills | Status: AC
  Filled 2024-04-24: qty 1, 1d supply, fill #0

## 2024-04-24 NOTE — Assessment & Plan Note (Signed)
 Reviewed findings with her. Reassured.  She was worried about progression of the valve dysfunction. May consider repeat echocardiogram in 2 to 3 years for any significant interval change. Will defer this to her PCP at this time.

## 2024-04-24 NOTE — Patient Instructions (Signed)
 Medication Instructions:  Your physician recommends that you continue on your current medications as directed. Please refer to the Current Medication list given to you today.  *If you need a refill on your cardiac medications before your next appointment, please call your pharmacy*  Lab Work: Your physician recommends that you return for lab work in:  Labs today: BMP  Testing/Procedures:   Your cardiac CT will be scheduled at one of the below locations:   [facility] [address] [place], [place] [address] [phone] (Severe contrast allergies only)  OR   [facility] [address] [place], [place] [address] [phone]  OR   [facility] [address] [place], [place] [address] [phone]  OR   [facility] [address]  [place], [place] [address]  OR   [facility] [address] [place], [place] [phone]  If scheduled at [facility], please arrive at the [facility] and Children's Entrance (Entrance C2) of [facility] 30 minutes prior to test start time. You can use the FREE valet parking offered at entrance C (encouraged to control the heart rate for the test)  Proceed to the [facility] Radiology Department (first floor) to check-in and test prep.  All radiology patients and guests should use entrance C2 at [facility], accessed from [facility], even though the hospital's physical address listed is [address]  If scheduled at the Heart and Vascular Tower at [address], please enter the parking lot using the [address] entrance and use the FREE valet service at the patient drop-off area. Enter the building and check-in with registration on the main floor.  If scheduled at [facility], please arrive to the Heart and Vascular Center 15 mins early for check-in and test prep.  There is  spacious parking and easy access to the radiology department from the [facility] Heart and Vascular entrance. Please enter here and check-in with the desk attendant.   If scheduled at [facility], please arrive 30 minutes early for check-in and test prep.  Please follow these instructions carefully (unless otherwise directed):  An IV will be required for this test and Nitroglycerin will be given.  Hold all erectile dysfunction medications at least 3 days (72 hrs) prior to test. (Ie viagra, cialis, sildenafil, tadalafil, etc)   On the Night Before the Test: Be sure to Drink plenty of water. Do not consume any caffeinated/decaffeinated beverages or chocolate 12 hours prior to your test. Do not take any antihistamines 12 hours prior to your test.  On the Day of the Test: Drink plenty of water until 1 hour prior to the test. Do not eat any food 1 hour prior to test. You may take your regular medications prior to the test.  Take metoprolol  (Lopressor ) two hours prior to test. Patients who wear a continuous glucose monitor MUST remove the device prior to scanning. FEMALES- please wear underwire-free bra if available, avoid dresses & tight clothing      After the Test: Drink plenty of water. After receiving IV contrast, you may experience a mild flushed feeling. This is normal. On occasion, you may experience a mild rash up to 24 hours after the test. This is not dangerous. If this occurs, you can take Benadryl 25 mg, Zyrtec, Claritin, or Allegra and increase your fluid intake. (Patients taking Tikosyn should avoid Benadryl, and may take Zyrtec, Claritin,  or Allegra) If you experience trouble breathing, this can be serious. If it is severe call 911 IMMEDIATELY. If it is mild, please call our office.  We will call to schedule your test 2-4 weeks out understanding that some insurance companies will need an authorization prior to the service being performed.   For more information and  frequently asked questions, please visit our website : [email or web address]  For non-scheduling related questions, please contact the cardiac imaging nurse navigator should you have any questions/concerns: Cardiac Imaging Nurse Navigators Direct Office Dial: [phone]   For scheduling needs, including cancellations and rescheduling, please call Brittany, [phone].   Follow-Up: At [facility], you and your health needs are our priority.  As part of our continuing mission to provide you with exceptional heart care, our providers are all part of one team.  This team includes your primary Cardiologist (physician) and Advanced Practice Providers or APPs (Physician Assistants and Nurse Practitioners) who all work together to provide you with the care you need, when you need it.  Your next appointment:   Follow up as needed with srm  Provider:   Alean Kobus, MD    We recommend signing up for the patient portal called MyChart.  Sign up information is provided on this After Visit Summary.  MyChart is used to connect with patients for Virtual Visits (Telemedicine).  Patients are able to view lab/test results, encounter notes, upcoming appointments, etc.  Non-urgent messages can be sent to your provider as well.   To learn more about what you can do with MyChart, go to [email or web address].   Other Instructions None

## 2024-04-24 NOTE — Assessment & Plan Note (Signed)
 Reported occasional palpitations fluttering in the chest and not associated with any significant rhythm issues on Zio patch monitor from 02/07/2024.  Reassured her.

## 2024-04-24 NOTE — Assessment & Plan Note (Addendum)
 Dyspnea on exertion, progressive.  Does have cardiovascular risk factors in the form of family history, obesity, stress.  Cardiac structure and function was reassuring on echocardiogram.  Proceed with cardiac CT coronary angiogram to rule out any significant obstructive coronary artery disease. Alternatives of stress test were reviewed by given her progressive symptoms and typical exertional symptoms would suggest cardiac CT.

## 2024-04-24 NOTE — Progress Notes (Signed)
 "  Cardiology Consultation:    Date:  04/24/2024   ID:  Kathleen Phillips, DOB [birth year], MRN [id number]  PCP:  Kathleen Vermell CROME, PA-C  Cardiologist:  Kathleen JONELLE Kobus, MD   Referring MD: Kathleen Vermell CROME, PA-C   No chief complaint on file.    ASSESSMENT AND PLAN:   [name] 49 year old woman with previous history of vasovagal syncope in the past [no episode in several years], suspected to have POTS [maintained on midodrine which she discontinued subsequently after her symptoms resolved with gradual increase in her weight], recent calcium score study 02/20/2024 was 0. Echocardiogram from 03/19/2024 noted normal biventricular function with LVEF 60 to 65%, normal diastolic function, trace MR. Was recently dealing with internal hemorrhoids, rectal bleeding.  Also has history of anxiety and situational stress/depression, hypothyroidism, obesity No prior history of CAD, CHF, MI, CVA.    7-day Zio patch monitor from 02/07/2024 noted predominantly sinus rhythm average heart rate 74/min [ranging from 48 to 128 bpm], rare ventricular and supraventricular ectopy burden less than 1%, total patient triggered events 8 correlated with normal heart rate and rhythm.  Reviewed the tracings myself.  Now here for further evaluation of dyspnea on exertion progressing over the past 6 months and atypical chest discomfort.  Problem List Items Addressed This Visit       Cardiovascular and Mediastinum   Trace mitral regurgitation by prior echocardiogram   Reviewed findings with her. Reassured.  She was worried about progression of the valve dysfunction. May consider repeat echocardiogram in 2 to 3 years for any significant interval change. Will defer this to her PCP at this time.      Relevant Medications   metoprolol  tartrate (LOPRESSOR ) 100 MG tablet     Other   Dyspnea on exertion - Primary   Dyspnea on exertion, progressive.  Does have cardiovascular risk factors in the form of family  history, obesity, stress.  Cardiac structure and function was reassuring on echocardiogram.  Proceed with cardiac CT coronary angiogram to rule out any significant obstructive coronary artery disease. Alternatives of stress test were reviewed by given her progressive symptoms and typical exertional symptoms would suggest cardiac CT.        Palpitations   Reported occasional palpitations fluttering in the chest and not associated with any significant rhythm issues on Zio patch monitor from 02/07/2024.  Reassured her.      Relevant Orders   EKG 12-Lead (Completed)   Basic Metabolic Panel (BMET)   Precordial pain   Relevant Orders   CT CORONARY MORPH W/CTA COR W/SCORE W/CA W/CM &/OR WO/CM   Basic Metabolic Panel (BMET)   Return to clinic based on results from the cardiac CT.   History of Present Illness:    Kathleen Phillips is a 49 y.o. female who is being seen today for the evaluation of palpitations and chest pain at the request of Kathleen, Jade L, PA-C.  Previously was evaluated at [facility] health cardiology back in January 2017 for syncope and suspected POTS and was maintained on midodrine.  Pleasant woman here for the visit by herself.  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  History of vasovagal syncope in the past [no episode in several years], suspected to have POTS [maintained on midodrine which she discontinued subsequently after her symptoms resolved with gradual increase in her weight], recent calcium score study 02/20/2024 was 0. Echocardiogram from 03/19/2024 noted normal biventricular function with LVEF 60 to 65%, normal diastolic function,  trace MR. Was recently dealing with internal hemorrhoids, rectal bleeding.  Also has history of anxiety and situational stress/depression, hypothyroidism, obesity No prior history of CAD, CHF, MI, CVA.  7-day Zio patch monitor from 02/07/2024 noted predominantly sinus rhythm average heart rate 74/min  [ranging from 48 to 128 bpm], rare ventricular and supraventricular ectopy burden less than 1%, total patient triggered events 8 correlated with normal heart rate and rhythm.  Reviewed the tracings myself.   EKG in the clinic today shows sinus rhythm heart rate 73/min, PR interval 160 ms, QRS duration 86 ms, QTc 440 ms no ischemic changes.  Describes symptoms of shortness of breath on exertion walking up 1 or 2 flights of stairs at work and moderate physical exertion at home or with sexual activity. This has been more noticeable over the past 6 months. Symptoms resolved with rest.  Mild day-to-day activities remain asymptomatic.  She also was describing atypical sensation of chest discomfort with more like a fluttering in the chest.  This was further evaluated with echocardiogram and Zio patch as reviewed above without any significant abnormalities.  Denies any recent syncopal or near syncopal episodes. Occasional pedal edema towards the end of the day noticed chronically, and she uses compression socks with good control.  Has modified her diet by portion control and lost close to 15 pounds in the past few months.  Does not smoke [smoked about a pack in her lifetime when she tried it intermittently]  Occasional social alcohol consumption in the past but quit over 5 years ago. No substance abuse. Drinks up to 2 cups of coffee a day.  Drinks Coke 0 occasionally.  Gives family history of mother passing away suddenly in her sleep in her early 24s.  Past Medical History:  Diagnosis Date   Abnormal weight gain 08/06/2020   Acute bilateral low back pain with left-sided sciatica 08/15/2023   Acute left-sided low back pain without sciatica 02/03/2021   Adjustment disorder with anxiety 03/22/2021   Adjustment insomnia 12/15/2020   Adrenal mass, left 05/15/2014   Anemia    Anxiety    Bipolar 2 disorder, major depressive episode (HCC) 02/26/2018   BMI 31.0-31.9,adult 08/14/2022   BMI  32.0-32.9,adult 08/29/2022   Brain fog 09/14/2021   Caregiver stress 12/15/2020   Carpal tunnel syndrome, left 01/20/2021   Chronic bilateral low back pain without sciatica 08/15/2023   Chronic pain of right knee 12/06/2021   Chronic UTI 04/01/2014   Class 2 obesity due to excess calories without serious comorbidity with body mass index (BMI) of 35.0 to 35.9 in adult 01/02/2020   Constipation    DDD (degenerative disc disease), lumbar 09/10/2023   Depression    Eating disorder 08/14/2022   Elevated LDL cholesterol level 11/23/2023   Elevated TSH 08/29/2022   Exposure to influenza 02/23/2021   Generalized anxiety disorder 02/28/2022   History of UTI 12/07/2023   Hypothyroidism    Insulin  resistance 08/29/2022   Internal hemorrhoids 12/07/2023   Migraine without aura and without status migrainosus, not intractable 01/05/2020   Migraines    Neurocardiogenic syncope    Palpitations    Pelvic floor dysfunction 05/15/2014   Plantar fascial fibromatosis of right foot 04/09/2024   POTS (postural orthostatic tachycardia syndrome) 04/01/2014   Recurrent cold sores 07/02/2020   Right ankle pain 11/01/2020   S/P endometrial ablation 04/14/2014   Sleep apnea    Slow transit constipation 05/28/2020   SOB (shortness of breath) on exertion 08/14/2022   Swelling of lower extremity  Thyroid  disease    Trochanteric bursitis of both hips 08/15/2023   Vaginal Pap smear, abnormal    Vascular malformation 04/01/2014   Formatting of this note might be different from the original.  Overview:   Left adrenal gland - diagnosed in 2008 when incidentally found on scan at [facility].  She saw surgeon for this and was told she did not need surgery based on scan.  Formatting of this note might be different from the original.  Left adrenal gland - diagnosed in 2008 when incidentally found on scan at [facility].  She saw surgeon for t   Vitamin D  deficiency 08/23/2022    Past Surgical History:  Procedure Laterality  Date   CERVICAL CONE BIOPSY     ENDOMETRIAL ABLATION     KNEE SURGERY     MENISCUS REPAIR      Current Medications: Active Medications[1]   Allergies:   Bupropion, Avocado, Wound dressing adhesive, Ibuprofen, Other, and Tape   Social History   Socioeconomic History   Marital status: Married    Spouse name: Not on file   Number of children: Not on file   Years of education: Not on file   Highest education level: Some college, no degree  Occupational History   Not on file  Tobacco Use   Smoking status: Never   Smokeless tobacco: Never  Substance and Sexual Activity   Alcohol use: Never   Drug use: Never   Sexual activity: Yes    Birth control/protection: Surgical    Comment: vasectomy  Other Topics Concern   Not on file  Social History Narrative   Not on file   Social Drivers of Health   Tobacco Use: Low Risk (04/24/2024)   Patient History    Smoking Tobacco Use: Never    Smokeless Tobacco Use: Never    Passive Exposure: Not on file  Financial Resource Strain: Low Risk (05/24/2023)   Overall Financial Resource Strain (CARDIA)    Difficulty of Paying Living Expenses: Not hard at all  Food Insecurity: No Food Insecurity (05/24/2023)   Hunger Vital Sign    Worried About Running Out of Food in the Last Year: Never true    Ran Out of Food in the Last Year: Never true  Transportation Needs: No Transportation Needs (05/24/2023)   PRAPARE - Administrator, Civil Service (Medical): No    Lack of Transportation (Non-Medical): No  Physical Activity: Sufficiently Active (05/24/2023)   Exercise Vital Sign    Days of Exercise per Week: 5 days    Minutes of Exercise per Session: 30 min  Stress: Stress Concern Present (05/24/2023)   [employer] of Occupational Health - Occupational Stress Questionnaire    Feeling of Stress : To some extent  Social Connections: Moderately Integrated (05/24/2023)   Social Connection and Isolation Panel    Frequency of  Communication with Friends and Family: Never    Frequency of Social Gatherings with Friends and Family: Never    Attends Religious Services: More than 4 times per year    Active Member of Clubs or Organizations: Yes    Attends Banker Meetings: 1 to 4 times per year    Marital Status: Married  Recent Concern: Social Connections - Socially Isolated (03/24/2023)   Received from [employer]   Social Network    How would you rate your social network (family, work, friends)?: Little participation, lonely and socially isolated  Depression (PHQ2-9): Low Risk (01/30/2024)   Depression (PHQ2-9)  PHQ-2 Score: 0  Alcohol Screen: Not on file  Housing: High Risk (05/24/2023)   Housing Stability Vital Sign    Unable to Pay for Housing in the Last Year: Yes    Number of Times Moved in the Last Year: Not on file    Homeless in the Last Year: No  Utilities: At Risk (03/24/2023)   Received from [facility] Utilities    Threatened with loss of utilities: Yes  Health Literacy: Not on file     Family History: The patient's family history includes Diabetes in her mother; Heart attack in her maternal grandmother and mother; Heart disease in her mother; Hypertension in her mother; Kidney disease in her father and mother; Lung cancer in her father; Skin cancer in her mother; Sudden death in her mother. ROS:   Please see the history of present illness.    All 14 point review of systems negative except as described per history of present illness.  EKGs/Labs/Other Studies Reviewed:    The following studies were reviewed today:   EKG:  EKG Interpretation Date/Time:  Thursday April 24 2024 14:12:02 EST Ventricular Rate:  73 PR Interval:  160 QRS Duration:  86 QT Interval:  376 QTC Calculation: 414 R Axis:   89  Text Interpretation: Normal sinus rhythm Normal ECG When compared with ECG of 29-Jan-2024 20:45, PREVIOUS ECG IS PRESENT Confirmed by Liborio Hai reddy  [phone]) on 04/24/2024 2:28:43 PM    Recent Labs: 01/29/2024: BUN 8; Creatinine, Ser 0.84; Hemoglobin 12.7; Platelets 159; Potassium 3.5; Sodium 141  Recent Lipid Panel    Component Value Date/Time   CHOL 162 11/13/2022 0911   TRIG 124 11/13/2022 0911   HDL 51 11/13/2022 0911   CHOLHDL 3.2 11/13/2022 0911   CHOLHDL 2.4 11/16/2021 0000   LDLCALC 89 11/13/2022 0911   LDLCALC 71 11/16/2021 0000    Physical Exam:    VS:  BP 100/76   Pulse 73   Ht 5' 8.5 (1.74 m)   Wt 225 lb (102.1 kg)   SpO2 99%   BMI 33.71 kg/m     Wt Readings from Last 3 Encounters:  04/24/24 225 lb (102.1 kg)  02/15/24 235 lb (106.6 kg)  01/30/24 235 lb (106.6 kg)     GENERAL:  Well nourished, well developed in no acute distress NECK: No JVD; No carotid bruits CARDIAC: RRR, S1 and S2 present, no murmurs, no rubs, no gallops CHEST:  Clear to auscultation without rales, wheezing or rhonchi  Extremities: No pitting pedal edema. Pulses bilaterally symmetric with radial 2+ and dorsalis pedis 2+ NEUROLOGIC:  Alert and oriented x 3  Medication Adjustments/Labs and Tests Ordered: Current medicines are reviewed at length with the patient today.  Concerns regarding medicines are outlined above.  Orders Placed This Encounter  Procedures   CT CORONARY MORPH W/CTA COR W/SCORE W/CA W/CM &/OR WO/CM   Basic Metabolic Panel (BMET)   EKG 12-Lead   Meds ordered this encounter  Medications   metoprolol  tartrate (LOPRESSOR ) 100 MG tablet    Sig: Take 1 tablet (100 mg total) by mouth once. Please take this medication 2 hours before CT.    Dispense:  1 tablet    Refill:  0    Signed, Nijah Tejera reddy Tenee Wish, MD, MPH, [facility]. 04/24/2024 3:07 PM    [facility]    [1]  Current Meds  Medication Sig   AMBULATORY NON FORMULARY MEDICATION Tirzepitide compounded   cariprazine  (VRAYLAR ) 3 MG capsule  Take 1 capsule (3 mg total) by mouth daily.   celecoxib  (CELEBREX ) 200 MG capsule Take 1-2  capsules (200-400 mg total) by mouth daily as needed for pain. (Patient taking differently: Take 200-400 mg by mouth daily.)   Cholecalciferol (VITAMIN D ) 50 MCG (2000 UT) tablet Take 2,000 Units by mouth daily.   clonazePAM  (KLONOPIN ) 0.5 MG tablet Take 1 tablet (0.5 mg total) by mouth 2 (two) times daily as needed for anxiety.   EPINEPHrine  0.3 mg/0.3 mL IJ SOAJ injection Inject 0.3 mg into the muscle as needed for anaphylaxis.   hydrocortisone  (ANUSOL -HC) 25 MG suppository Place 1 suppository (25 mg total) rectally 2 (two) times daily as needed for hemorrhoids and rectal bleeding.   levocetirizine (XYZAL ) 5 MG tablet Take 1 tablet (5 mg total) by mouth every evening.   levothyroxine  (SYNTHROID ) 75 MCG tablet Take 1 tablet (75 mcg total) by mouth daily.   methocarbamol  (ROBAXIN ) 500 MG tablet Take 1 tablet (500 mg total) by mouth 3 (three) times daily.   metoprolol  tartrate (LOPRESSOR ) 100 MG tablet Take 1 tablet (100 mg total) by mouth once. Please take this medication 2 hours before CT.   Multiple Vitamins-Minerals (MULTIVITAMIN WITH MINERALS) tablet Take 1 tablet by mouth daily.   mupirocin  ointment (BACTROBAN ) 2 % Apply to affected area TID for 7 days.   ondansetron  (ZOFRAN -ODT) 8 MG disintegrating tablet Take 1 tablet (8 mg total) by mouth every 8 (eight) hours as needed for nausea.   rizatriptan  (MAXALT ) 10 MG tablet Take 1 tablet (10 mg total) by mouth as needed for migraine. May repeat in 2 hours if needed.   topiramate  (TOPAMAX ) 100 MG tablet Take 1 tablet (100 mg total) by mouth daily.   traMADol  (ULTRAM ) 50 MG tablet Take 1-2 tablets (50-100 mg total) by mouth every 12 (twelve) hours as needed for moderate pain (pain score 4-6). Maximum 6 tabs per day.   triamcinolone  cream (KENALOG ) 0.5 % Apply 1 Application topically 2 (two) times daily. Not to exceed 14 consecutive days   valACYclovir  (VALTREX ) 500 MG tablet Take one tablet every 12 hours for 3 days for outbreaks.   vortioxetine  HBr  (TRINTELLIX ) 20 MG TABS tablet Take 1 tablet (20 mg total) by mouth daily.   "

## 2024-04-25 ENCOUNTER — Ambulatory Visit (HOSPITAL_BASED_OUTPATIENT_CLINIC_OR_DEPARTMENT_OTHER)
Admission: RE | Admit: 2024-04-25 | Discharge: 2024-04-25 | Disposition: A | Source: Ambulatory Visit | Attending: Physician Assistant | Admitting: Physician Assistant

## 2024-04-25 DIAGNOSIS — R110 Nausea: Secondary | ICD-10-CM | POA: Insufficient documentation

## 2024-04-25 DIAGNOSIS — R6881 Early satiety: Secondary | ICD-10-CM | POA: Insufficient documentation

## 2024-04-25 LAB — BASIC METABOLIC PANEL WITH GFR
BUN/Creatinine Ratio: 4 — ABNORMAL LOW (ref 9–23)
BUN: 4 mg/dL — ABNORMAL LOW (ref 6–24)
CO2: 20 mmol/L (ref 20–29)
Calcium: 9.5 mg/dL (ref 8.7–10.2)
Chloride: 105 mmol/L (ref 96–106)
Creatinine, Ser: 0.89 mg/dL (ref 0.57–1.00)
Glucose: 110 mg/dL — ABNORMAL HIGH (ref 70–99)
Potassium: 4.3 mmol/L (ref 3.5–5.2)
Sodium: 140 mmol/L (ref 134–144)
eGFR: 81 mL/min/1.73

## 2024-05-02 ENCOUNTER — Other Ambulatory Visit (HOSPITAL_BASED_OUTPATIENT_CLINIC_OR_DEPARTMENT_OTHER): Payer: Self-pay

## 2024-05-02 ENCOUNTER — Ambulatory Visit: Admitting: Medical-Surgical

## 2024-05-02 ENCOUNTER — Encounter: Payer: Self-pay | Admitting: Medical-Surgical

## 2024-05-02 VITALS — BP 112/70 | HR 83 | Temp 98.5°F | Resp 20 | Ht 68.5 in | Wt 224.0 lb

## 2024-05-02 DIAGNOSIS — J019 Acute sinusitis, unspecified: Secondary | ICD-10-CM

## 2024-05-02 DIAGNOSIS — B9689 Other specified bacterial agents as the cause of diseases classified elsewhere: Secondary | ICD-10-CM | POA: Diagnosis not present

## 2024-05-02 MED ORDER — AMOXICILLIN-POT CLAVULANATE 875-125 MG PO TABS
1.0000 | ORAL_TABLET | Freq: Two times a day (BID) | ORAL | 0 refills | Status: AC
  Filled 2024-05-02: qty 20, 10d supply, fill #0

## 2024-05-02 MED ORDER — FLUCONAZOLE 150 MG PO TABS
150.0000 mg | ORAL_TABLET | Freq: Once | ORAL | 0 refills | Status: AC
  Filled 2024-05-02: qty 2, 3d supply, fill #0

## 2024-05-02 MED ORDER — METHYLPREDNISOLONE 4 MG PO TBPK
ORAL_TABLET | ORAL | 0 refills | Status: AC
  Filled 2024-05-02: qty 21, 6d supply, fill #0

## 2024-05-02 NOTE — Progress Notes (Signed)
" ° °       Established patient visit  History, exam, impression, and plan:  1. Acute bacterial sinusitis (Primary) Pleasant 49 year old female presenting today with 2 weeks of sinus congestion, rhinorrhea, post-nasal drip, headache, left facial pressure, and cough. Has now developed left sided neck pain that radiates up to just beneath the left ear. Has treated with OTC medications with minimal benefit. On exam, sinus tenderness to palpation with left cervical lymphadenopathy. After 2 weeks of symptoms without improvement, treating with Augmentin  BID and Medrol  dose pack. Adding Diflucan  for VVC prophylaxis. OK to continue OTC medications for symptom management as needed.   Physical Exam Vitals reviewed.  Constitutional:      General: She is not in acute distress.    Appearance: Normal appearance. She is not ill-appearing.  HENT:     Head: Normocephalic and atraumatic.     Right Ear: Tympanic membrane, ear canal and external ear normal. There is no impacted cerumen.     Left Ear: Tympanic membrane, ear canal and external ear normal. There is no impacted cerumen.     Nose: Congestion present.     Right Sinus: No maxillary sinus tenderness or frontal sinus tenderness.     Left Sinus: Maxillary sinus tenderness and frontal sinus tenderness present.     Mouth/Throat:     Mouth: Mucous membranes are moist.     Pharynx: Posterior oropharyngeal erythema present.  Eyes:     General:        Right eye: No discharge.        Left eye: No discharge.     Extraocular Movements: Extraocular movements intact.     Conjunctiva/sclera: Conjunctivae normal.     Pupils: Pupils are equal, round, and reactive to light.  Cardiovascular:     Rate and Rhythm: Normal rate and regular rhythm.     Pulses: Normal pulses.     Heart sounds: Normal heart sounds. No murmur heard.    No friction rub. No gallop.  Pulmonary:     Effort: Pulmonary effort is normal. No respiratory distress.     Breath sounds: Normal  breath sounds. No wheezing.  Lymphadenopathy:     Cervical: No cervical adenopathy.  Skin:    General: Skin is warm and dry.  Neurological:     Mental Status: She is alert and oriented to person, place, and time.  Psychiatric:        Mood and Affect: Mood normal.        Behavior: Behavior normal.        Thought Content: Thought content normal.        Judgment: Judgment normal.   Procedures performed this visit: None.  Return if symptoms worsen or fail to improve.  __________________________________ Zada FREDRIK Palin, DNP, APRN, FNP-BC Primary Care and Sports Medicine [facility] "

## 2024-05-13 ENCOUNTER — Ambulatory Visit (HOSPITAL_BASED_OUTPATIENT_CLINIC_OR_DEPARTMENT_OTHER)

## 2024-05-21 ENCOUNTER — Ambulatory Visit: Admitting: Professional
